# Patient Record
Sex: Female | Born: 1955 | Race: Black or African American | Hispanic: No | Marital: Single | Smoking: Never smoker
Health system: Southern US, Community
[De-identification: ages and names within clinical notes are randomized; demographics above are authoritative.]

## PROBLEM LIST (undated history)

## (undated) DIAGNOSIS — R1310 Dysphagia, unspecified: Secondary | ICD-10-CM

## (undated) DIAGNOSIS — M1990 Unspecified osteoarthritis, unspecified site: Secondary | ICD-10-CM

## (undated) DIAGNOSIS — E0590 Thyrotoxicosis, unspecified without thyrotoxic crisis or storm: Secondary | ICD-10-CM

## (undated) DIAGNOSIS — E785 Hyperlipidemia, unspecified: Secondary | ICD-10-CM

## (undated) DIAGNOSIS — R569 Unspecified convulsions: Secondary | ICD-10-CM

## (undated) DIAGNOSIS — I639 Cerebral infarction, unspecified: Secondary | ICD-10-CM

## (undated) DIAGNOSIS — I10 Essential (primary) hypertension: Secondary | ICD-10-CM

## (undated) DIAGNOSIS — M329 Systemic lupus erythematosus, unspecified: Secondary | ICD-10-CM

## (undated) HISTORY — DX: Unspecified osteoarthritis, unspecified site: M19.90

## (undated) HISTORY — DX: Hyperlipidemia, unspecified: E78.5

## (undated) HISTORY — DX: Thyrotoxicosis, unspecified without thyrotoxic crisis or storm: E05.90

## (undated) HISTORY — DX: Essential (primary) hypertension: I10

---

## 2004-11-15 ENCOUNTER — Ambulatory Visit: Payer: Self-pay | Admitting: Family Medicine

## 2005-12-18 ENCOUNTER — Ambulatory Visit: Payer: Self-pay | Admitting: Family Medicine

## 2009-07-20 ENCOUNTER — Encounter: Payer: Self-pay | Admitting: Family Medicine

## 2016-01-02 ENCOUNTER — Encounter (INDEPENDENT_AMBULATORY_CARE_PROVIDER_SITE_OTHER): Payer: Self-pay

## 2016-01-02 ENCOUNTER — Encounter: Payer: Self-pay | Admitting: Family Medicine

## 2016-01-02 ENCOUNTER — Ambulatory Visit (INDEPENDENT_AMBULATORY_CARE_PROVIDER_SITE_OTHER): Payer: PRIVATE HEALTH INSURANCE

## 2016-01-02 ENCOUNTER — Ambulatory Visit: Payer: PRIVATE HEALTH INSURANCE

## 2016-01-02 ENCOUNTER — Ambulatory Visit (INDEPENDENT_AMBULATORY_CARE_PROVIDER_SITE_OTHER): Payer: PRIVATE HEALTH INSURANCE | Admitting: Family Medicine

## 2016-01-02 VITALS — BP 150/76 | HR 106 | Temp 97.8°F | Ht 66.5 in | Wt 228.0 lb

## 2016-01-02 DIAGNOSIS — R601 Generalized edema: Secondary | ICD-10-CM

## 2016-01-02 DIAGNOSIS — M2550 Pain in unspecified joint: Secondary | ICD-10-CM

## 2016-01-02 DIAGNOSIS — M79642 Pain in left hand: Principal | ICD-10-CM

## 2016-01-02 DIAGNOSIS — M79641 Pain in right hand: Secondary | ICD-10-CM | POA: Diagnosis not present

## 2016-01-02 DIAGNOSIS — N289 Disorder of kidney and ureter, unspecified: Secondary | ICD-10-CM | POA: Diagnosis not present

## 2016-01-02 MED ORDER — DICLOFENAC SODIUM 75 MG PO TBEC: 75.0000 mg | DELAYED_RELEASE_TABLET | Freq: Two times a day (BID) | ORAL | Status: DC

## 2016-01-02 NOTE — Addendum Note (Signed)
Addended by: [name] on: 01/02/2016 04:03 PM   Modules accepted: Orders

## 2016-01-02 NOTE — Progress Notes (Signed)
Subjective:  Patient ID: Rebekah [name], female    DOB: 08-02-1956  Age: 60 y.o. MRN: [id number]  CC: Establish Care and Leg Swelling   HPI Rebekah [name] presents for 2 weeks of increased swelling in feet with pain at feet and knees when first standing. Feels slow and stiff. Limbers up in a few minutes. Went to [facility] for knees 2 weeks ago. Had XR showing arthritis. Injection into joints has not helped significantly. Now Considering next step. She went to [facility] two days ago as as swelling in legs worsened.   History Rebekah [name] has no past medical history on file.   She has past surgical history that includes Cesarean section.   Her family history includes Cancer in her father and mother; Diabetes in her mother; Hypertension in her brother, brother, mother, sister, sister, sister, sister, sister, and sister.She has no tobacco, alcohol, and drug history on file.  No current outpatient prescriptions on file prior to visit.   No current facility-administered medications on file prior to visit.    ROS Review of Systems  Constitutional: Negative for fever, activity change and appetite change.  HENT: Negative for congestion, rhinorrhea and sore throat.   Eyes: Negative for visual disturbance.  Respiratory: Negative for cough and shortness of breath.   Cardiovascular: Negative for chest pain and palpitations.  Gastrointestinal: Negative for nausea, abdominal pain and diarrhea.  Genitourinary: Negative for dysuria.  Musculoskeletal: Positive for myalgias, joint swelling and arthralgias.    Objective:  BP 150/76 mmHg  Pulse 106  Temp(Src) 97.8 F (36.6 C) (Oral)  Ht 5' 6.5" (1.689 m)  Wt 228 lb (103.42 kg)  BMI 36.25 kg/m2  SpO2 100%  Physical Exam  Constitutional: She is oriented to person, place, and time. She appears well-developed and well-nourished. No distress.  HENT:  Head: Normocephalic and atraumatic.  Right Ear: External ear normal.  Left Ear:  External ear normal.  Nose: Nose normal.  Mouth/Throat: Oropharynx is clear and moist.  Eyes: Conjunctivae and EOM are normal. Pupils are equal, round, and reactive to light.  Neck: Normal range of motion. Neck supple. No thyromegaly present.  Cardiovascular: Normal rate, regular rhythm and normal heart sounds.   No murmur heard. Pulmonary/Chest: Effort normal and breath sounds normal. No respiratory distress. She has no wheezes. She has no rales.  Abdominal: Soft. Bowel sounds are normal. She exhibits no distension. There is no tenderness.  Lymphadenopathy:    She has no cervical adenopathy.  Neurological: She is alert and oriented to person, place, and time. She has normal reflexes.  Skin: Skin is warm and dry.  Psychiatric: She has a normal mood and affect. Her behavior is normal. Judgment and thought content normal.    Assessment & Plan:   Rebekah [name] was seen today for establish care and leg swelling.  Diagnoses and all orders for this visit:  Arthralgia -     Arthritis Panel -     Anti-Scleroderma Antibody -     ANA -     Anti-DNA antibody, double-stranded  Pain in both hands -     DG Hand Complete Left; Future -     DG Hand Complete Right; Future  Renal insufficiency -     CMP14+EGFR  Generalized edema -     CMP14+EGFR   I am having [name] maintain her potassium chloride SA and furosemide.  Meds ordered this encounter  Medications  . potassium chloride SA (K-DUR,KLOR-CON) 20 MEQ tablet    Sig:  Take 20 mEq by mouth 2 (two) times daily.  . furosemide (LASIX) 20 MG tablet    Sig: Take 20 mg by mouth.     Follow-up: Return in about 2 weeks (around 01/16/2016).  [name], M.D.

## 2016-01-02 NOTE — Patient Instructions (Signed)
Earwax removal: ° °Debrox drops are available without a prescription at your pharmacy. ° °Lay on your side with the ear up that you want to treat. Place for 5 drops of the Debrox in the ear canal and lay still for 15 minutes. After that time you considered up and allow the excess to run out of the year and catch it with a Kleenex. Repeat this with the other ear if needed. ° °Repeat this process daily for 1 week. By that time the ear should feel less clogged and her hearing should be better, if not, follow up in the office for recheck of the ear. ° °Thanks, °Keaton Beichner °

## 2016-01-03 ENCOUNTER — Telehealth: Payer: Self-pay | Admitting: Family Medicine

## 2016-01-03 ENCOUNTER — Other Ambulatory Visit: Payer: Self-pay | Admitting: Family Medicine

## 2016-01-03 DIAGNOSIS — M129 Arthropathy, unspecified: Secondary | ICD-10-CM

## 2016-01-03 LAB — ARTHRITIS PANEL
Basophils Absolute: 0 10*3/uL (ref 0.0–0.2)
Basos: 0 %
EOS (ABSOLUTE): 0.1 10*3/uL (ref 0.0–0.4)
Eos: 1 %
HEMATOCRIT: 33 % — AB (ref 34.0–46.6)
HEMOGLOBIN: 10.4 g/dL — AB (ref 11.1–15.9)
IMMATURE GRANS (ABS): 0 10*3/uL (ref 0.0–0.1)
Immature Granulocytes: 1 %
LYMPHS: 14 %
Lymphocytes Absolute: 1.2 10*3/uL (ref 0.7–3.1)
MCH: 27.7 pg (ref 26.6–33.0)
MCHC: 31.5 g/dL (ref 31.5–35.7)
MCV: 88 fL (ref 79–97)
Monocytes Absolute: 0.5 10*3/uL (ref 0.1–0.9)
Monocytes: 6 %
NEUTROS ABS: 6.4 10*3/uL (ref 1.4–7.0)
Neutrophils: 78 %
Platelets: 668 10*3/uL — ABNORMAL HIGH (ref 150–379)
RBC: 3.75 x10E6/uL — ABNORMAL LOW (ref 3.77–5.28)
RDW: 14.9 % (ref 12.3–15.4)
RHEUMATOID FACTOR: 18.2 [IU]/mL — AB (ref 0.0–13.9)
Sed Rate: 89 mm/hr — ABNORMAL HIGH (ref 0–40)
URIC ACID: 6.4 mg/dL (ref 2.5–7.1)
WBC: 8.2 10*3/uL (ref 3.4–10.8)

## 2016-01-03 LAB — CMP14+EGFR
A/G RATIO: 0.7 — AB (ref 1.2–2.2)
ALBUMIN: 3.1 g/dL — AB (ref 3.6–4.8)
ALT: 65 IU/L — ABNORMAL HIGH (ref 0–32)
AST: 63 IU/L — ABNORMAL HIGH (ref 0–40)
Alkaline Phosphatase: 90 IU/L (ref 39–117)
BILIRUBIN TOTAL: 0.4 mg/dL (ref 0.0–1.2)
BUN / CREAT RATIO: 22 (ref 12–28)
BUN: 28 mg/dL — ABNORMAL HIGH (ref 8–27)
CHLORIDE: 99 mmol/L (ref 96–106)
CO2: 21 mmol/L (ref 18–29)
Calcium: 9 mg/dL (ref 8.7–10.3)
Creatinine, Ser: 1.26 mg/dL — ABNORMAL HIGH (ref 0.57–1.00)
GFR calc non Af Amer: 46 mL/min/{1.73_m2} — ABNORMAL LOW (ref >59)
GFR, EST AFRICAN AMERICAN: 54 mL/min/{1.73_m2} — AB (ref >59)
GLOBULIN, TOTAL: 4.6 g/dL — AB (ref 1.5–4.5)
Glucose: 94 mg/dL (ref 65–99)
POTASSIUM: 5 mmol/L (ref 3.5–5.2)
Sodium: 137 mmol/L (ref 134–144)
TOTAL PROTEIN: 7.7 g/dL (ref 6.0–8.5)

## 2016-01-03 LAB — ANTI-SCLERODERMA ANTIBODY: Scleroderma SCL-70: <0.2 AI (ref 0.0–0.9)

## 2016-01-03 LAB — ANA: Anti Nuclear Antibody(ANA): POSITIVE — AB

## 2016-01-03 LAB — ANTI-DNA ANTIBODY, DOUBLE-STRANDED: DSDNA AB: 104 [IU]/mL — AB (ref 0–9)

## 2016-01-03 NOTE — Telephone Encounter (Signed)
Patient called stating that she is fatigue.  Informed patient to drink plenty of fluids and to get some rest and if it does not resolve to call back

## 2016-01-05 ENCOUNTER — Telehealth: Payer: Self-pay | Admitting: Family Medicine

## 2016-01-06 NOTE — Telephone Encounter (Signed)
Patient aware that I faxed for her to [facility] [phone] per patient.

## 2016-01-10 ENCOUNTER — Other Ambulatory Visit: Payer: Self-pay | Admitting: *Deleted

## 2016-01-10 ENCOUNTER — Telehealth: Payer: Self-pay | Admitting: Family Medicine

## 2016-01-10 MED ORDER — POTASSIUM CHLORIDE CRYS ER 20 MEQ PO TBCR: 20.0000 meq | EXTENDED_RELEASE_TABLET | Freq: Every day | ORAL | Status: DC

## 2016-01-10 MED ORDER — FUROSEMIDE 20 MG PO TABS: 20.0000 mg | ORAL_TABLET | Freq: Every day | ORAL | Status: DC

## 2016-01-10 NOTE — Telephone Encounter (Signed)
Patient aware to continue to take the lasix and potassium.

## 2016-01-16 ENCOUNTER — Ambulatory Visit (INDEPENDENT_AMBULATORY_CARE_PROVIDER_SITE_OTHER): Payer: PRIVATE HEALTH INSURANCE | Admitting: Family Medicine

## 2016-01-16 ENCOUNTER — Encounter: Payer: Self-pay | Admitting: Family Medicine

## 2016-01-16 VITALS — BP 150/82 | HR 105 | Temp 97.8°F | Ht 66.5 in | Wt 217.0 lb

## 2016-01-16 DIAGNOSIS — M0579 Rheumatoid arthritis with rheumatoid factor of multiple sites without organ or systems involvement: Secondary | ICD-10-CM

## 2016-01-16 MED ORDER — TRAMADOL HCL 50 MG PO TABS: 50.0000 mg | ORAL_TABLET | Freq: Four times a day (QID) | ORAL | Status: DC | PRN

## 2016-01-16 MED ORDER — SUVOREXANT 15 MG PO TABS: 15.0000 mg | ORAL_TABLET | Freq: Every day | ORAL | Status: DC

## 2016-01-16 NOTE — Progress Notes (Signed)
Subjective:  Patient ID: Rebekah [name], female    DOB: [birth year]  Age: 60 y.o. MRN: [id number]  CC: Foot Swelling; Fatigue; and Joint Pain   HPI Rebekah [name] presents forcontinued swelling in feet with pain at feet and knees when first standing. Feels slow and stiff. Limbers up after getting up and moving around, but pain is preventing sleep and she is exhausted. Swelling is mild, but shoes are tight. Fingers won't bend right due to edema.. Even bought a new mattress and gets no relief.Rheumatology referral set up for May 17.  History Rebekah [name] has no past medical history on file.   She has past surgical history that includes Cesarean section.   Her family history includes Cancer in her father and mother; Diabetes in her mother; Hypertension in her brother, brother, mother, sister, sister, sister, sister, sister, and sister.She reports that she has never smoked. She does not have any smokeless tobacco history on file. Her alcohol and drug histories are not on file.  No current outpatient prescriptions on file prior to visit.   No current facility-administered medications on file prior to visit.    ROS Review of Systems  Constitutional: Negative for fever, activity change and appetite change.  HENT: Negative for congestion, rhinorrhea and sore throat.   Eyes: Negative for visual disturbance.  Respiratory: Negative for cough and shortness of breath.   Cardiovascular: Negative for chest pain and palpitations.  Gastrointestinal: Negative for nausea, abdominal pain and diarrhea.  Genitourinary: Negative for dysuria.  Musculoskeletal: Positive for myalgias, joint swelling and arthralgias.  Psychiatric/Behavioral: Positive for sleep disturbance, decreased concentration and agitation.    Objective:  BP 150/82 mmHg  Pulse 105  Temp(Src) 97.8 F (36.6 C) (Oral)  Ht 5' 6.5" (1.689 m)  Wt 217 lb (98.431 kg)  BMI 34.50 kg/m2  SpO2 100%  Physical Exam  Constitutional: She is  oriented to person, place, and time. She appears well-developed and well-nourished. No distress.  HENT:  Head: Normocephalic and atraumatic.  Right Ear: External ear normal.  Left Ear: External ear normal.  Nose: Nose normal.  Mouth/Throat: Oropharynx is clear and moist.  Eyes: Conjunctivae and EOM are normal. Pupils are equal, round, and reactive to light.  Neck: Normal range of motion. Neck supple. No thyromegaly present.  Cardiovascular: Normal rate, regular rhythm and normal heart sounds.   No murmur heard. Pulmonary/Chest: Effort normal and breath sounds normal. No respiratory distress. She has no wheezes. She has no rales.  Abdominal: Soft. Bowel sounds are normal. She exhibits no distension. There is no tenderness.  Musculoskeletal: She exhibits edema (1-2+ at feet and hands.).  Lymphadenopathy:    She has no cervical adenopathy.  Neurological: She is alert and oriented to person, place, and time. She has normal reflexes.  Skin: Skin is warm and dry.  Psychiatric: She has a normal mood and affect. Her behavior is normal. Judgment and thought content normal.    Assessment & Plan:   Rebekah [name] was seen today for foot swelling, fatigue and joint pain.  Diagnoses and all orders for this visit:  Rheumatoid arthritis involving multiple sites with positive rheumatoid factor ([place])  Other orders -     traMADol (ULTRAM) 50 MG tablet; Take 1 tablet (50 mg total) by mouth 4 (four) times daily as needed for moderate pain. -     Suvorexant (BELSOMRA) 15 MG TABS; Take 15 mg by mouth at bedtime. For sleep   I have discontinued Ms. Tardiff's diclofenac, furosemide, and potassium chloride  SA. I am also having her start on traMADol and Suvorexant.  Meds ordered this encounter  Medications  . traMADol (ULTRAM) 50 MG tablet    Sig: Take 1 tablet (50 mg total) by mouth 4 (four) times daily as needed for moderate pain.    Dispense:  60 tablet    Refill:  02  . Suvorexant (BELSOMRA) 15 MG TABS      Sig: Take 15 mg by mouth at bedtime. For sleep    Dispense:  30 tablet    Refill:  2     Follow-up: Return in about 6 weeks (around 02/27/2016) for hypertension, pain, sleep.  [name], M.D.

## 2016-02-06 ENCOUNTER — Telehealth: Payer: Self-pay

## 2016-02-06 NOTE — Telephone Encounter (Signed)
Insurance denied prior authorization for [employer]

## 2016-02-06 NOTE — Telephone Encounter (Signed)
Please contact the patient to see if she is willing to try something else.

## 2016-02-07 MED ORDER — ZALEPLON 5 MG PO CAPS: 5.0000 mg | ORAL_CAPSULE | Freq: Every evening | ORAL | Status: DC | PRN

## 2016-02-07 NOTE — Telephone Encounter (Signed)
Please call in zaleplon as written and let the pt know.

## 2016-02-07 NOTE — Addendum Note (Signed)
Addended by: [name] on: 02/07/2016 12:15 PM   Modules accepted: Orders, Medications

## 2016-02-07 NOTE — Telephone Encounter (Signed)
Pt would like to try something else.

## 2016-02-07 NOTE — Telephone Encounter (Signed)
Pt notified of new RX 

## 2016-04-04 ENCOUNTER — Telehealth: Payer: Self-pay | Admitting: Family Medicine

## 2016-05-06 ENCOUNTER — Telehealth: Payer: Self-pay | Admitting: Family Medicine

## 2016-05-14 ENCOUNTER — Ambulatory Visit (INDEPENDENT_AMBULATORY_CARE_PROVIDER_SITE_OTHER): Payer: PRIVATE HEALTH INSURANCE | Admitting: Family Medicine

## 2016-05-14 ENCOUNTER — Encounter: Payer: Self-pay | Admitting: Family Medicine

## 2016-05-14 VITALS — BP 177/87 | HR 98 | Temp 98.4°F | Ht 66.5 in | Wt 223.6 lb

## 2016-05-14 DIAGNOSIS — H6123 Impacted cerumen, bilateral: Secondary | ICD-10-CM

## 2016-05-14 DIAGNOSIS — J0100 Acute maxillary sinusitis, unspecified: Secondary | ICD-10-CM | POA: Diagnosis not present

## 2016-05-14 DIAGNOSIS — M0579 Rheumatoid arthritis with rheumatoid factor of multiple sites without organ or systems involvement: Secondary | ICD-10-CM

## 2016-05-14 MED ORDER — AMOXICILLIN-POT CLAVULANATE 875-125 MG PO TABS: 1.0000 | ORAL_TABLET | Freq: Two times a day (BID) | ORAL | 0 refills | Status: DC

## 2016-05-14 MED ORDER — BETAMETHASONE SOD PHOS & ACET 6 (3-3) MG/ML IJ SUSP
6.0000 mg | Freq: Once | INTRAMUSCULAR | Status: AC
  Administered 2016-05-14: 6 mg via INTRAMUSCULAR

## 2016-05-14 MED ORDER — PSEUDOEPHEDRINE-GUAIFENESIN ER 120-1200 MG PO TB12: 1.0000 | ORAL_TABLET | Freq: Two times a day (BID) | ORAL | 0 refills | Status: DC

## 2016-05-14 NOTE — Progress Notes (Signed)
Subjective:  Patient ID: Rebekah [name], female    DOB: 07/27/1956  Age: 60 y.o. MRN: [id number]  CC: Cough (feels deep, productive - greenish yellow now, over 2 wks now, OTC mucinex & cough med, no known fever) and Nasal Congestion   HPI Rebekah [name] presents for 3 weeks of increasing congestion. This developed into posterior drainage. From there she developed cough. This has become productive. Recently it's become yellow-green in color. She's had no fever. She denies shortness of breath. She has had some full feeling in her ears and decreased hearing. She's been cleaning with a Q-tip.  Patient has been officially diagnosed with rheumatoid arthritis and is taking Plaquenil and prednisone. The prednisone is being tapered. She is now at 1 tablet daily. Her arthralgias have been diminished significantly. She is just not taking any advanced immunosuppression drugs.   History Rebekah [name] has a past medical history of Arthritis.   She has a past surgical history that includes Cesarean section.   Her family history includes Cancer in her father and mother; Diabetes in her mother; Hypertension in her brother, brother, mother, sister, sister, sister, sister, sister, and sister.She reports that she has never smoked. She has never used smokeless tobacco. She reports that she does not drink alcohol or use drugs.    ROS Review of Systems  Constitutional: Negative for activity change, appetite change, chills and fever.  HENT: Positive for congestion, postnasal drip, rhinorrhea and sinus pressure. Negative for ear discharge, ear pain, hearing loss, nosebleeds, sneezing and trouble swallowing.   Respiratory: Negative for chest tightness and shortness of breath.   Cardiovascular: Negative for chest pain and palpitations.  Skin: Negative for rash.    Objective:  BP (!) 177/87 (BP Location: Right Arm, Patient Position: Sitting, Cuff Size: Large)   Pulse 98   Temp 98.4 F (36.9 C) (Oral)   Ht  5' 6.5" (1.689 m)   Wt 223 lb 9.6 oz (101.4 kg)   BMI 35.55 kg/m   BP Readings from Last 3 Encounters:  05/14/16 (!) 177/87  01/16/16 (!) 150/82  01/02/16 (!) 150/76    Wt Readings from Last 3 Encounters:  05/14/16 223 lb 9.6 oz (101.4 kg)  01/16/16 217 lb (98.4 kg)  01/02/16 228 lb (103.4 kg)     Physical Exam  Constitutional: She is oriented to person, place, and time. She appears well-developed and well-nourished.  HENT:  Head: Normocephalic and atraumatic.  Right Ear: External ear normal. No mastoid tenderness. No decreased hearing is noted.  Left Ear: External ear normal. No mastoid tenderness. No decreased hearing is noted.  Nose: Mucosal edema present. Right sinus exhibits maxillary sinus tenderness. Right sinus exhibits no frontal sinus tenderness. Left sinus exhibits maxillary sinus tenderness. Left sinus exhibits no frontal sinus tenderness.  Mouth/Throat: No oropharyngeal exudate or posterior oropharyngeal erythema.  Bilateral ear canals occluded with cerumen. Patient is talking a little bit more loudly then I would anticipate normal for her. I suspect this is from mild hearing loss due to cerumen occlusion  Neck: No Brudzinski's sign noted.  Pulmonary/Chest: No respiratory distress. She has wheezes (fair to good exchange with scattered wheezing).  Lymphadenopathy:       Head (right side): No preauricular adenopathy present.       Head (left side): No preauricular adenopathy present.       Right cervical: No superficial cervical adenopathy present.      Left cervical: No superficial cervical adenopathy present.  Neurological: She is alert and  oriented to person, place, and time.  Skin: Skin is warm and dry. No erythema.     Lab Results  Component Value Date   WBC 8.2 01/02/2016   HCT 33.0 (L) 01/02/2016   PLT 668 (H) 01/02/2016   GLUCOSE 94 01/02/2016   ALT 65 (H) 01/02/2016   AST 63 (H) 01/02/2016   NA 137 01/02/2016   K 5.0 01/02/2016   CL 99 01/02/2016     CREATININE 1.26 (H) 01/02/2016   BUN 28 (H) 01/02/2016   CO2 21 01/02/2016    No results found.  Assessment & Plan:   [name] was seen today for cough and nasal congestion.  Diagnoses and all orders for this visit:  Acute maxillary sinusitis, recurrence not specified  Cerumen impaction, bilateral  Rheumatoid arthritis involving multiple sites with positive rheumatoid factor ([place])      I am having [name] maintain her traMADol, zaleplon, hydroxychloroquine, and predniSONE.  Meds ordered this encounter  Medications  . hydroxychloroquine (PLAQUENIL) 200 MG tablet    Sig: Take 1 tablet by mouth 2 (two) times daily.  . predniSONE (DELTASONE) 2.5 MG tablet    Sig: Take 2.5 mg by mouth daily with breakfast.     Follow-up: No Follow-up on file.  [name], M.D.

## 2017-01-20 ENCOUNTER — Other Ambulatory Visit: Payer: Self-pay | Admitting: Family Medicine

## 2017-01-21 NOTE — Telephone Encounter (Signed)
Left detailed message per dpr  

## 2017-01-21 NOTE — Telephone Encounter (Signed)
Authorize 30 days only. Then contact the patient letting them know that they will need an appointment before any further prescriptions can be sent in. 

## 2017-06-13 ENCOUNTER — Other Ambulatory Visit (HOSPITAL_COMMUNITY): Payer: Self-pay | Admitting: Nephrology

## 2017-06-13 DIAGNOSIS — R809 Proteinuria, unspecified: Secondary | ICD-10-CM

## 2017-06-19 ENCOUNTER — Other Ambulatory Visit: Payer: Self-pay | Admitting: Radiology

## 2017-06-23 ENCOUNTER — Ambulatory Visit (HOSPITAL_COMMUNITY)
Admission: RE | Admit: 2017-06-23 | Discharge: 2017-06-23 | Disposition: A | Discharge status: Home / Self Care | Payer: PRIVATE HEALTH INSURANCE | Source: Ambulatory Visit | Attending: Nephrology | Admitting: Nephrology

## 2017-06-23 ENCOUNTER — Encounter (HOSPITAL_COMMUNITY): Payer: Self-pay

## 2018-02-03 ENCOUNTER — Telehealth: Payer: Self-pay | Admitting: Family Medicine

## 2018-04-30 ENCOUNTER — Institutional Professional Consult (permissible substitution): Payer: PRIVATE HEALTH INSURANCE | Admitting: Neurology

## 2018-06-09 ENCOUNTER — Encounter

## 2018-06-09 ENCOUNTER — Institutional Professional Consult (permissible substitution): Payer: PRIVATE HEALTH INSURANCE | Admitting: Neurology

## 2018-09-30 ENCOUNTER — Encounter: Payer: Self-pay | Admitting: Family Medicine

## 2018-10-03 ENCOUNTER — Other Ambulatory Visit: Payer: Self-pay

## 2018-10-03 ENCOUNTER — Inpatient Hospital Stay (HOSPITAL_COMMUNITY)
Admission: AD | Admit: 2018-10-03 | Discharge: 2018-10-05 | DRG: 194 | Disposition: A | Discharge status: Home / Self Care | Payer: PRIVATE HEALTH INSURANCE | Source: Other Acute Inpatient Hospital | Attending: Internal Medicine | Admitting: Internal Medicine

## 2018-10-03 DIAGNOSIS — Z8249 Family history of ischemic heart disease and other diseases of the circulatory system: Secondary | ICD-10-CM

## 2018-10-03 DIAGNOSIS — I3139 Other pericardial effusion (noninflammatory): Secondary | ICD-10-CM | POA: Diagnosis present

## 2018-10-03 DIAGNOSIS — F419 Anxiety disorder, unspecified: Secondary | ICD-10-CM | POA: Diagnosis present

## 2018-10-03 DIAGNOSIS — I313 Pericardial effusion (noninflammatory): Secondary | ICD-10-CM

## 2018-10-03 DIAGNOSIS — J189 Pneumonia, unspecified organism: Secondary | ICD-10-CM | POA: Diagnosis present

## 2018-10-03 DIAGNOSIS — R0602 Shortness of breath: Secondary | ICD-10-CM

## 2018-10-03 DIAGNOSIS — I129 Hypertensive chronic kidney disease with stage 1 through stage 4 chronic kidney disease, or unspecified chronic kidney disease: Secondary | ICD-10-CM | POA: Diagnosis present

## 2018-10-03 DIAGNOSIS — Z79899 Other long term (current) drug therapy: Secondary | ICD-10-CM

## 2018-10-03 DIAGNOSIS — I10 Essential (primary) hypertension: Secondary | ICD-10-CM

## 2018-10-03 DIAGNOSIS — J90 Pleural effusion, not elsewhere classified: Secondary | ICD-10-CM | POA: Diagnosis present

## 2018-10-03 DIAGNOSIS — N184 Chronic kidney disease, stage 4 (severe): Secondary | ICD-10-CM

## 2018-10-03 DIAGNOSIS — M0579 Rheumatoid arthritis with rheumatoid factor of multiple sites without organ or systems involvement: Secondary | ICD-10-CM | POA: Diagnosis present

## 2018-10-03 DIAGNOSIS — J181 Lobar pneumonia, unspecified organism: Secondary | ICD-10-CM

## 2018-10-03 LAB — CREATININE, SERUM
CREATININE: 1.5 mg/dL — AB (ref 0.44–1.00)
GFR calc Af Amer: 43 mL/min — ABNORMAL LOW (ref >60)
GFR calc non Af Amer: 37 mL/min — ABNORMAL LOW (ref >60)

## 2018-10-03 LAB — CBC
HCT: 27.6 % — ABNORMAL LOW (ref 36.0–46.0)
Hemoglobin: 8.5 g/dL — ABNORMAL LOW (ref 12.0–15.0)
MCH: 26 pg (ref 26.0–34.0)
MCHC: 30.8 g/dL (ref 30.0–36.0)
MCV: 84.4 fL (ref 80.0–100.0)
PLATELETS: 558 10*3/uL — AB (ref 150–400)
RBC: 3.27 MIL/uL — ABNORMAL LOW (ref 3.87–5.11)
RDW: 15 % (ref 11.5–15.5)
WBC: 11.9 10*3/uL — ABNORMAL HIGH (ref 4.0–10.5)
nRBC: 0 % (ref 0.0–0.2)

## 2018-10-03 MED ORDER — LISINOPRIL 20 MG PO TABS
40.0000 mg | ORAL_TABLET | Freq: Every day | ORAL | Status: DC
  Administered 2018-10-04 – 2018-10-05 (×2): 40 mg via ORAL
  Filled 2018-10-03 (×2): qty 2

## 2018-10-03 MED ORDER — HEPARIN SODIUM (PORCINE) 5000 UNIT/ML IJ SOLN
5000.0000 [IU] | Freq: Three times a day (TID) | INTRAMUSCULAR | Status: DC
  Administered 2018-10-03 – 2018-10-05 (×6): 5000 [IU] via SUBCUTANEOUS
  Filled 2018-10-03 (×6): qty 1

## 2018-10-03 MED ORDER — SODIUM CHLORIDE 0.9 % IV SOLN
500.0000 mg | INTRAVENOUS | Status: DC
  Administered 2018-10-03 – 2018-10-04 (×2): 500 mg via INTRAVENOUS
  Filled 2018-10-03 (×3): qty 500

## 2018-10-03 MED ORDER — SODIUM CHLORIDE 0.9 % IV SOLN
INTRAVENOUS | Status: DC
  Administered 2018-10-03: 23:00:00 via INTRAVENOUS

## 2018-10-03 MED ORDER — SODIUM CHLORIDE 0.9 % IV SOLN
1.0000 g | INTRAVENOUS | Status: DC
  Administered 2018-10-04 (×2): 1 g via INTRAVENOUS
  Filled 2018-10-03 (×2): qty 10

## 2018-10-03 MED ORDER — PREDNISONE 2.5 MG PO TABS
2.5000 mg | ORAL_TABLET | Freq: Every day | ORAL | Status: DC
  Administered 2018-10-04 – 2018-10-05 (×2): 2.5 mg via ORAL
  Filled 2018-10-03 (×3): qty 1

## 2018-10-03 MED ORDER — HYDROXYCHLOROQUINE SULFATE 200 MG PO TABS
200.0000 mg | ORAL_TABLET | Freq: Two times a day (BID) | ORAL | Status: DC
  Administered 2018-10-03 – 2018-10-05 (×4): 200 mg via ORAL
  Filled 2018-10-03 (×5): qty 1

## 2018-10-03 NOTE — H&P (Signed)
History and Physical   Rebekah [name] [id number] DOB: October 06, 1955 DOA: 10/03/2018  Referring MD/NP/PA: Benson Norway  PCP: [name], MD   Outpatient Specialists: Benson Norway  Patient coming from: [facility]  Chief Complaint: Shortness of breath  HPI: Rebekah [name] is a 63 y.o. female with medical history significant of rheumatoid arthritis, hypertension, chronic kidney disease stage III with baseline creatinine 1.5, no known cardiac history who apparently started having exertional dyspnea on and off for the last 3 weeks.  It got worse yesterday.  Patient was seen by her PCP when she first started having dyspnea and was treated for community-acquired pneumonia.  She was initiated with ceftriaxone and azithromycin in the ER today.  CT chest showed right lower lobe pneumonia and antibiotics were switched to Zosyn.  Patient has had new oxygen requirements.  She was placed on 8 L with oxygen sat of 94% over there.  She was also found to have moderate new pericardial effusion on CT.  She has no hemodynamic instability and no tamponade but patient requires cardiology evaluation.  They were unable to get echocardiogram or cardiologist available over the weekend.  She was sent over for further treatment..  ED Course: Vitals at [facility] rocking him with relatively stable but patient is requiring 8 L of oxygen.  She has a white count of 12.8 and the CT finding a right lower lobe pneumonia.  Patient also has hypertension but blood pressure was controlled.  Based on her clinical needs she was started on Zosyn and sent over here for further treatment  Review of Systems: As per HPI otherwise 10 point review of systems negative.    Past Medical History:  Diagnosis Date  . Arthritis    Rheumatiod    Past Surgical History:  Procedure Laterality Date  . CESAREAN SECTION       reports that she has never smoked. She has never used smokeless tobacco. She reports that she does not drink  alcohol or use drugs.  No Known Allergies  Family History  Problem Relation Age of Onset  . Diabetes Mother   . Hypertension Mother   . Cancer Mother   . Cancer Father   . Hypertension Sister   . Hypertension Brother   . Hypertension Sister   . Hypertension Sister   . Hypertension Sister   . Hypertension Brother   . Hypertension Sister   . Hypertension Sister      Prior to Admission medications   Medication Sig Start Date End Date Taking? Authorizing Provider  hydroxychloroquine (PLAQUENIL) 200 MG tablet Take 1 tablet by mouth 2 (two) times daily. 02/09/16   [provider]  lisinopril (PRINIVIL,ZESTRIL) 40 MG tablet Take 40 mg by mouth daily.    [provider]  predniSONE (DELTASONE) 2.5 MG tablet Take 2.5 mg by mouth daily as needed.     [provider]    Physical Exam: Vitals:   10/03/18 2029  BP: 133/78  Pulse: 92  Resp: (!) 26  Temp: 98.6 F (37 C)  TempSrc: Oral  SpO2: 96%  Weight: 99 kg  Height: 5\' 6"  (1.676 m)      Constitutional: NAD, calm, comfortable Vitals:   10/03/18 2029  BP: 133/78  Pulse: 92  Resp: (!) 26  Temp: 98.6 F (37 C)  TempSrc: Oral  SpO2: 96%  Weight: 99 kg  Height: 5\' 6"  (1.676 m)   Eyes: PERRL, lids and conjunctivae normal ENMT: Mucous membranes are moist. Posterior pharynx clear of  any exudate or lesions.Normal dentition.  Neck: normal, supple, no masses, no thyromegaly Respiratory: clear to auscultatio good air entry  bilaterally, no wheezing, mild bilateral crackles. Normal respiratory effort. No accessory muscle use.  Cardiovascular: Distant heart sounds, regular rate and rhythm, no murmurs / rubs / gallops. No extremity edema. 2+ pedal pulses. No carotid bruits.  Abdomen: no tenderness, no masses palpated. No hepatosplenomegaly. Bowel sounds positive.  Musculoskeletal: no clubbing / cyanosis. No joint deformity upper and lower extremities. Good ROM, no contractures. Normal muscle tone.  Skin:  no rashes, lesions, ulcers. No induration Neurologic: CN 2-12 grossly intact. Sensation intact, DTR normal. Strength 5/5 in all 4.  Psychiatric: Normal judgment and insight. Alert and oriented x 3. Normal mood.     Labs on Admission: I have personally reviewed following labs and imaging studies  CBC: No results for input(s): WBC, NEUTROABS, HGB, HCT, MCV, PLT in the last 168 hours. Basic Metabolic Panel: No results for input(s): NA, K, CL, CO2, GLUCOSE, BUN, CREATININE, CALCIUM, MG, PHOS in the last 168 hours. GFR: CrCl cannot be calculated (Patient's most recent lab result is older than the maximum 21 days allowed.). Liver Function Tests: No results for input(s): AST, ALT, ALKPHOS, BILITOT, PROT, ALBUMIN in the last 168 hours. No results for input(s): LIPASE, AMYLASE in the last 168 hours. No results for input(s): AMMONIA in the last 168 hours. Coagulation Profile: No results for input(s): INR, PROTIME in the last 168 hours. Cardiac Enzymes: No results for input(s): CKTOTAL, CKMB, CKMBINDEX, TROPONINI in the last 168 hours. BNP (last 3 results) No results for input(s): PROBNP in the last 8760 hours. HbA1C: No results for input(s): HGBA1C in the last 72 hours. CBG: No results for input(s): GLUCAP in the last 168 hours. Lipid Profile: No results for input(s): CHOL, HDL, LDLCALC, TRIG, CHOLHDL, LDLDIRECT in the last 72 hours. Thyroid Function Tests: No results for input(s): TSH, T4TOTAL, FREET4, T3FREE, THYROIDAB in the last 72 hours. Anemia Panel: No results for input(s): VITAMINB12, FOLATE, FERRITIN, TIBC, IRON, RETICCTPCT in the last 72 hours. Urine analysis: No results found for: COLORURINE, APPEARANCEUR, LABSPEC, PHURINE, GLUCOSEU, HGBUR, BILIRUBINUR, KETONESUR, PROTEINUR, UROBILINOGEN, NITRITE, LEUKOCYTESUR Sepsis Labs: @LABRCNTIP (procalcitonin:4,lacticidven:4) )No results found for this or any previous visit (from the past 240 hour(s)).   Radiological Exams on  Admission: No results found.    Assessment/Plan Principal Problem:   Pneumonia Active Problems:   Rheumatoid arthritis involving multiple sites with positive rheumatoid factor (HCC)   Pericardial effusion   Benign essential HTN     #1 community-acquired pneumonia: Patient will be admitted and be started on the Rocephin and Zithromax.  I still believe she will benefit from that.  She only had initial dose in the ED over there.  Get blood cultures and sputum cultures and adjust antibiotics as necessary.  #2 moderate pericardial effusion: Most likely secondary to her rheumatoid arthritis.  No concern for tamponade at the moment.  We will get echocardiogram and cardiology consultation as needed.  #3 history of rheumatoid arthritis: Currently on Plaquenil and steroids.  Continue treatment.  #4 hypertension: Patient currently on lisinopril.  Continue lisinopril and monitor.  She has chronic kidney disease also.  Watch renal function on lisinopril.  #5 chronic kidney disease stage III: Monitor renal function.  Most likely related to autoimmune disease.   DVT prophylaxis: Heparin Code Status: Full code Family Communication: Sister and brother-in-law in the room Disposition Plan: Home Consults called: Cardiology to be called depending on echo results Admission status: Inpatient  Severity of Illness: The appropriate patient status for this patient is INPATIENT. Inpatient status is judged to be reasonable and necessary in order to provide the required intensity of service to ensure the patient's safety. The patient's presenting symptoms, physical exam findings, and initial radiographic and laboratory data in the context of their chronic comorbidities is felt to place them at high risk for further clinical deterioration. Furthermore, it is not anticipated that the patient will be medically stable for discharge from the hospital within 2 midnights of admission. The following factors support the  patient status of inpatient.   " The patient's presenting symptoms include Shortness of breath and cough. " The worrisome physical exam findings include bilateral crackles and distant heart sounds. " The initial radiographic and laboratory data are worrisome because of pneumonia with pericardial effusion. " The chronic co-morbidities include rheumatoid arthritis.   * I certify that at the point of admission it is my clinical judgment that the patient will require inpatient hospital care spanning beyond 2 midnights from the point of admission due to high intensity of service, high risk for further deterioration and high frequency of surveillance required.Barbette Merino MD [facility] Pager [phone]  If 7PM-7AM, please contact night-coverage [email or web address] Password [facility]  10/03/2018, 9:46 PM

## 2018-10-04 ENCOUNTER — Inpatient Hospital Stay (HOSPITAL_COMMUNITY): Payer: PRIVATE HEALTH INSURANCE

## 2018-10-04 ENCOUNTER — Encounter (HOSPITAL_COMMUNITY): Payer: Self-pay | Admitting: *Deleted

## 2018-10-04 DIAGNOSIS — R0602 Shortness of breath: Secondary | ICD-10-CM

## 2018-10-04 LAB — COMPREHENSIVE METABOLIC PANEL
ALK PHOS: 62 U/L (ref 38–126)
ALT: 54 U/L — AB (ref 0–44)
AST: 42 U/L — ABNORMAL HIGH (ref 15–41)
Albumin: 2.2 g/dL — ABNORMAL LOW (ref 3.5–5.0)
Anion gap: 11 (ref 5–15)
BUN: 16 mg/dL (ref 8–23)
CALCIUM: 8.5 mg/dL — AB (ref 8.9–10.3)
CO2: 22 mmol/L (ref 22–32)
Chloride: 105 mmol/L (ref 98–111)
Creatinine, Ser: 1.43 mg/dL — ABNORMAL HIGH (ref 0.44–1.00)
GFR calc non Af Amer: 39 mL/min — ABNORMAL LOW (ref >60)
GFR, EST AFRICAN AMERICAN: 45 mL/min — AB (ref >60)
Glucose, Bld: 151 mg/dL — ABNORMAL HIGH (ref 70–99)
Potassium: 4.8 mmol/L (ref 3.5–5.1)
Sodium: 138 mmol/L (ref 135–145)
Total Bilirubin: 0.7 mg/dL (ref 0.3–1.2)
Total Protein: 6.6 g/dL (ref 6.5–8.1)

## 2018-10-04 LAB — HIV ANTIBODY (ROUTINE TESTING W REFLEX): HIV Screen 4th Generation wRfx: NONREACTIVE

## 2018-10-04 LAB — MRSA PCR SCREENING: MRSA BY PCR: NEGATIVE

## 2018-10-04 NOTE — Plan of Care (Signed)

## 2018-10-04 NOTE — Progress Notes (Signed)
  Echocardiogram 2D Echocardiogram has been performed.  Rebekah [name] 10/04/2018, 6:07 PM

## 2018-10-04 NOTE — Progress Notes (Signed)
Pt arrived to unit. VSS. MD aware of patients arrival. Awaiting orders. Will continue to monitor.

## 2018-10-04 NOTE — Progress Notes (Signed)
PROGRESS NOTE    Rebekah [name]  ZHG:[id number] DOB: August 20, 1956 DOA: 10/03/2018 PCP: [name], MD   Brief Narrative: Patient is a 63 year old female with past medical history of rheumatoid arthritis, hypertension, chronic kidney disease stage III with baseline creatinine of 1.5 who was having exertional dyspnea on and off for last 3 weeks.    CT chest done at [facility]  had shown right lower lobe pneumonia and she was started on ceftriaxone azithromycin.  CT chest also showed pericardial effusion and she was sent here for echardiogram and cardiology evaluation. We have continued treatment for an pneumonia here.  Echocardiogram has been ordered.  Assessment & Plan:   Principal Problem:   Pneumonia Active Problems:   Rheumatoid arthritis involving multiple sites with positive rheumatoid factor (HCC)   Pericardial effusion   Benign essential HTN   Chronic kidney disease (CKD), stage IV (severe) (HCC)  Pericardial effusion as per CT scan: Echocardiogram has been ordered.  Will consider cardiology evaluation depending upon the echo report. Patient denies chest pain.  She is hemodynamically stable.  Community-acquired pneumonia: CT chest done at the previous hospital showed right-sided pneumonia.  Chest x-ray done here today showed increased aeration of the right lung, bilateral small pleural effusions.  Currently on ceftriaxone and azithromycin and will continue that.  Follow-up blood cultures, sputum cultures.  Hypertension: Currently blood pressure stable.  Currently on lisinopril which we will continue  History of rheumatoid arthritis: On Plaquenil and steroids.  Continue those  CKD stage III: Currently stable and kidney function is at baseline.           DVT prophylaxis: Heparin [place] Code Status: Full Family Communication: None present at the bedside Disposition Plan: Home after full work-up   Consultants: None  Procedures: None  Antimicrobials:    Anti-infectives (From admission, onward)   Start     Dose/Rate Route Frequency Ordered Stop   10/03/18 2359  hydroxychloroquine (PLAQUENIL) tablet 200 mg     200 mg Oral 2 times daily 10/03/18 2144     10/03/18 2230  azithromycin (ZITHROMAX) 500 mg in sodium chloride 0.9 % 250 mL IVPB     500 mg 250 mL/hr over 60 Minutes Intravenous Every 24 hours 10/03/18 2144 10/10/18 2229   10/03/18 2145  cefTRIAXone (ROCEPHIN) 1 g in sodium chloride 0.9 % 100 mL IVPB     1 g 200 mL/hr over 30 Minutes Intravenous Every 24 hours 10/03/18 2144 10/10/18 2144      Subjective: Patient seen and examined at bedside this morning.  Remains comfortable.  Hemodynamically stable.  Denies any chest pain or shortness of breath at rest.  Objective: Vitals:   10/03/18 2356 10/04/18 0308 10/04/18 0823 10/04/18 1131  BP: 131/74 135/74 (!) 147/69 124/67  Pulse: 85 83 89 81  Resp: (!) 24 20 (!) 22 (!) 25  Temp: 98.4 F (36.9 C) 97.7 F (36.5 C) 98.2 F (36.8 C) 98.4 F (36.9 C)  TempSrc: Oral Oral Oral Oral  SpO2: 96% 95% 98% 94%  Weight:      Height:        Intake/Output Summary (Last 24 hours) at 10/04/2018 1143 Last data filed at 10/04/2018 0500 Gross per 24 hour  Intake 618.45 ml  Output -  Net 618.45 ml   Filed Weights   10/03/18 2029  Weight: 99 kg    Examination:  General exam: Appears calm and comfortable ,Not in distress,obese  HEENT:PERRL,Oral mucosa moist, Ear/Nose normal on gross exam Respiratory system: Bilateral  decreased air entry in the bases, decreased air entry on the left side, no wheezes or crackles Cardiovascular system: S1 & S2 heard, RRR. No JVD, murmurs, rubs, gallops or clicks. No pedal edema. Gastrointestinal system: Abdomen is nondistended, soft and nontender. No organomegaly or masses felt. Normal bowel sounds heard. Central nervous system: Alert and oriented. No focal neurological deficits. Extremities: No edema, no clubbing ,no cyanosis, distal peripheral pulses  palpable. Skin: No rashes, lesions or ulcers,no icterus ,no pallor MSK: Normal muscle bulk,tone ,power Psychiatry: Judgement and insight appear normal. Mood & affect appropriate.     Data Reviewed: I have personally reviewed following labs and imaging studies  CBC: Recent Labs  Lab 10/03/18 2205  WBC 11.9*  HGB 8.5*  HCT 27.6*  MCV 84.4  PLT 450*   Basic Metabolic Panel: Recent Labs  Lab 10/03/18 2205 10/04/18 0222  NA  --  138  K  --  4.8  CL  --  105  CO2  --  22  GLUCOSE  --  151*  BUN  --  16  CREATININE 1.50* 1.43*  CALCIUM  --  8.5*   GFR: Estimated Creatinine Clearance: 47.8 mL/min (A) (by C-G formula based on SCr of 1.43 mg/dL (H)). Liver Function Tests: Recent Labs  Lab 10/04/18 0222  AST 42*  ALT 54*  ALKPHOS 62  BILITOT 0.7  PROT 6.6  ALBUMIN 2.2*   No results for input(s): LIPASE, AMYLASE in the last 168 hours. No results for input(s): AMMONIA in the last 168 hours. Coagulation Profile: No results for input(s): INR, PROTIME in the last 168 hours. Cardiac Enzymes: No results for input(s): CKTOTAL, CKMB, CKMBINDEX, TROPONINI in the last 168 hours. BNP (last 3 results) No results for input(s): PROBNP in the last 8760 hours. HbA1C: No results for input(s): HGBA1C in the last 72 hours. CBG: No results for input(s): GLUCAP in the last 168 hours. Lipid Profile: No results for input(s): CHOL, HDL, LDLCALC, TRIG, CHOLHDL, LDLDIRECT in the last 72 hours. Thyroid Function Tests: No results for input(s): TSH, T4TOTAL, FREET4, T3FREE, THYROIDAB in the last 72 hours. Anemia Panel: No results for input(s): VITAMINB12, FOLATE, FERRITIN, TIBC, IRON, RETICCTPCT in the last 72 hours. Sepsis Labs: No results for input(s): PROCALCITON, LATICACIDVEN in the last 168 hours.  Recent Results (from the past 240 hour(s))  MRSA PCR Screening     Status: None   Collection Time: 10/04/18  1:43 AM  Result Value Ref Range Status   MRSA by PCR NEGATIVE NEGATIVE Final      Comment:        The GeneXpert MRSA Assay (FDA approved for NASAL specimens only), is one component of a comprehensive MRSA colonization surveillance program. It is not intended to diagnose MRSA infection nor to guide or monitor treatment for MRSA infections. Performed at [facility], [place] [address], [place], [place] [address]          Radiology Studies: Dg Chest 2 View  Result Date: 10/04/2018 CLINICAL DATA:  Shortness of breath. EXAM: CHEST - 2 VIEW COMPARISON:  Chest radiograph and CT from 10/03/2018 FINDINGS: Stable enlargement of the cardiac silhouette and related to the known pericardial effusion. Patchy densities in the lower chest are similar to the previous examination compatible with areas of volume loss and small loculated pleural effusions. Slightly improved aeration in the mid and lower right chest. Negative for a pneumothorax. IMPRESSION: 1. Slightly improved aeration in the right lower chest compared to the recent comparison examination. 2.  Stable enlargement of the cardiac silhouette related to the pericardial effusion. 3. Densities in the lower chest bilaterally related to small loculated pleural effusions and volume loss. Electronically Signed   By: Markus Daft M.D.   On: 10/04/2018 10:22        Scheduled Meds: . heparin  5,000 Units Subcutaneous Q8H  . hydroxychloroquine  200 mg Oral BID  . lisinopril  40 mg Oral Daily  . predniSONE  2.5 mg Oral Q breakfast   Continuous Infusions: . sodium chloride 75 mL/hr at 10/04/18 0500  . azithromycin Stopped (10/04/18 0028)  . cefTRIAXone (ROCEPHIN)  IV Stopped (10/04/18 0328)     LOS: 1 day    Time spent: 35 mins.More than 50% of that time was spent in counseling and/or coordination of care.      Shelly Coss, MD [facility] Pager [phone]  If 7PM-7AM, please contact night-coverage [email or web address] Password [facility] 10/04/2018, 11:43 AM

## 2018-10-05 ENCOUNTER — Telehealth: Payer: Self-pay | Admitting: Cardiovascular Disease

## 2018-10-05 DIAGNOSIS — I3139 Other pericardial effusion (noninflammatory): Secondary | ICD-10-CM

## 2018-10-05 DIAGNOSIS — I313 Pericardial effusion (noninflammatory): Secondary | ICD-10-CM

## 2018-10-05 LAB — CBC WITH DIFFERENTIAL/PLATELET
Abs Immature Granulocytes: 0.19 10*3/uL — ABNORMAL HIGH (ref 0.00–0.07)
Basophils Absolute: 0 10*3/uL (ref 0.0–0.1)
Basophils Relative: 0 %
Eosinophils Absolute: 0 10*3/uL (ref 0.0–0.5)
Eosinophils Relative: 0 %
HCT: 28 % — ABNORMAL LOW (ref 36.0–46.0)
Hemoglobin: 8.7 g/dL — ABNORMAL LOW (ref 12.0–15.0)
Immature Granulocytes: 2 %
Lymphocytes Relative: 9 %
Lymphs Abs: 0.8 10*3/uL (ref 0.7–4.0)
MCH: 26.3 pg (ref 26.0–34.0)
MCHC: 31.1 g/dL (ref 30.0–36.0)
MCV: 84.6 fL (ref 80.0–100.0)
Monocytes Absolute: 0.4 10*3/uL (ref 0.1–1.0)
Monocytes Relative: 5 %
Neutro Abs: 6.8 10*3/uL (ref 1.7–7.7)
Neutrophils Relative %: 84 %
Platelets: 721 10*3/uL — ABNORMAL HIGH (ref 150–400)
RBC: 3.31 MIL/uL — ABNORMAL LOW (ref 3.87–5.11)
RDW: 15.2 % (ref 11.5–15.5)
WBC: 8.2 10*3/uL (ref 4.0–10.5)
nRBC: 0 % (ref 0.0–0.2)

## 2018-10-05 LAB — BASIC METABOLIC PANEL
Anion gap: 9 (ref 5–15)
BUN: 19 mg/dL (ref 8–23)
CALCIUM: 8.6 mg/dL — AB (ref 8.9–10.3)
CO2: 22 mmol/L (ref 22–32)
Chloride: 109 mmol/L (ref 98–111)
Creatinine, Ser: 1.39 mg/dL — ABNORMAL HIGH (ref 0.44–1.00)
GFR calc Af Amer: 47 mL/min — ABNORMAL LOW (ref >60)
GFR calc non Af Amer: 40 mL/min — ABNORMAL LOW (ref >60)
Glucose, Bld: 114 mg/dL — ABNORMAL HIGH (ref 70–99)
Potassium: 4.6 mmol/L (ref 3.5–5.1)
SODIUM: 140 mmol/L (ref 135–145)

## 2018-10-05 LAB — ECHOCARDIOGRAM COMPLETE
Height: 66 in
Weight: 3492.09 oz

## 2018-10-05 LAB — STREP PNEUMONIAE URINARY ANTIGEN: Strep Pneumo Urinary Antigen: NEGATIVE

## 2018-10-05 LAB — TSH: TSH: 2.916 u[IU]/mL (ref 0.350–4.500)

## 2018-10-05 MED ORDER — ALPRAZOLAM 0.5 MG PO TABS: 0.5000 mg | ORAL_TABLET | Freq: Three times a day (TID) | ORAL | Status: DC | PRN

## 2018-10-05 MED ORDER — CEFDINIR 300 MG PO CAPS: 300.0000 mg | ORAL_CAPSULE | Freq: Two times a day (BID) | ORAL | 0 refills | Status: DC

## 2018-10-05 MED ORDER — ALPRAZOLAM 0.5 MG PO TABS: 0.5000 mg | ORAL_TABLET | Freq: Three times a day (TID) | ORAL | 0 refills | Status: DC | PRN

## 2018-10-05 MED ORDER — ENSURE ENLIVE PO LIQD: 237.0000 mL | Freq: Every day | ORAL | Status: DC

## 2018-10-05 NOTE — Telephone Encounter (Signed)
Received call from hospitalist Pt sent to [facility] from [facility] for PE   ( No echo there ) Echo here showed small circumferential PE   Pt has hx of RA Pt without CP   NOthing to suggest pericarditis   Recomm Check TSH if not done  If comfortable OK to d/c  I told him we would make sure pt had f/u as outpt in clinic in 4 to 6 wks  Scheduled  limited echo for that visit to see if resolved

## 2018-10-05 NOTE — Plan of Care (Signed)

## 2018-10-05 NOTE — Progress Notes (Signed)
Discharged home accompanied by sister , discharge instructions given to pt. Belongings taken home.

## 2018-10-05 NOTE — Discharge Summary (Signed)
Physician Discharge Summary  Rebekah [name] ZMO:[id number] DOB: [birth year] DOA: 10/03/2018  PCP: [name], MD  Admit date: 10/03/2018 Discharge date: 10/05/2018  Admitted From: Home Disposition:  Home  Discharge Condition:Stable CODE STATUS:FULL Diet recommendation: Heart Healthy  Brief/Interim Summary: Patient is a 63 year old female with past medical history of rheumatoid arthritis, hypertension, chronic kidney disease stage III with baseline creatinine of 1.5 who was having exertional dyspnea on and off for last 3 weeks.    CT chest done at [facility]  had shown right lower lobe pneumonia and she was started on ceftriaxone azithromycin.  CT chest also showed pericardial effusion and she was sent here for echardiogram and cardiology evaluation. We  continued treatment for an pneumonia here.   Currently her respiratory status has significantly improved.  Antibiotics changed to oral on discharge. Echocardiogram showed just small pericardial effusion.  Discussed with cardiology , no need for further intervention.  She will follow-up with cardiology as an outpatient.    Following problems were addressed during her hospitalization:  Pericardial effusion as per CT scan: Echocardiogram showed ejection fraction of 66-65%, no wall motion abnormality, small pericardial effusion.  Discussed with cardiology, no need for further intervention.  She will be followed by cardiology as an outpatient for follow-up. Patient denies chest pain.  She is hemodynamically stable.  Community-acquired pneumonia: CT chest done at the previous hospital showed right-sided pneumonia.  Chest x-ray done here today showed increased aeration of the right lung, bilateral small pleural effusions.  Was on ceftriaxone and azithromycin.  Negative blood cultures.  Currently respiratory status stable.  Saturating fine on room air.  Antibiotics changed to oral on discharge  Hypertension: Currently blood pressure  stable.    Resume home medications  History of rheumatoid arthritis: On Plaquenil . Continue   CKD stage III: Currently stable and kidney function is at baseline.  Anxiety: Continue Xanax as needed.    Discharge Diagnoses:  Principal Problem:   Pneumonia Active Problems:   Rheumatoid arthritis involving multiple sites with positive rheumatoid factor (HCC)   Pericardial effusion   Benign essential HTN   Chronic kidney disease (CKD), stage IV (severe) (HCC)    Discharge Instructions  Discharge Instructions    Diet - low sodium heart healthy   Complete by:  As directed    Increase activity slowly   Complete by:  As directed      Allergies as of 10/05/2018   No Known Allergies     Medication List    STOP taking these medications   azithromycin 250 MG tablet Commonly known as:  ZITHROMAX     TAKE these medications   ALPRAZolam 0.5 MG tablet Commonly known as:  XANAX Take 1 tablet (0.5 mg total) by mouth 3 (three) times daily as needed for anxiety.   amLODipine 5 MG tablet Commonly known as:  NORVASC Take 5 mg by mouth daily.   atenolol 100 MG tablet Commonly known as:  TENORMIN Take 100 mg by mouth 2 (two) times daily.   cefdinir 300 MG capsule Commonly known as:  OMNICEF Take 1 capsule (300 mg total) by mouth 2 (two) times daily.   furosemide 40 MG tablet Commonly known as:  LASIX Take 40 mg by mouth daily.   hydroxychloroquine 200 MG tablet Commonly known as:  PLAQUENIL Take 200 mg by mouth 2 (two) times daily.      Follow-up Information    [name], MD. Schedule an appointment as soon as possible for a visit in  1 week(s).   Specialty:  Family Medicine Contact information: [place] [address] [phone]          No Known Allergies  Consultations:  Cardiology   Procedures/Studies: Dg Chest 2 View  Result Date: 10/04/2018 CLINICAL DATA:  Shortness of breath. EXAM: CHEST - 2 VIEW COMPARISON:  Chest radiograph  and CT from 10/03/2018 FINDINGS: Stable enlargement of the cardiac silhouette and related to the known pericardial effusion. Patchy densities in the lower chest are similar to the previous examination compatible with areas of volume loss and small loculated pleural effusions. Slightly improved aeration in the mid and lower right chest. Negative for a pneumothorax. IMPRESSION: 1. Slightly improved aeration in the right lower chest compared to the recent comparison examination. 2. Stable enlargement of the cardiac silhouette related to the pericardial effusion. 3. Densities in the lower chest bilaterally related to small loculated pleural effusions and volume loss. Electronically Signed   By: Markus Daft M.D.   On: 10/04/2018 10:22       Subjective: Patient seen and examined bedside this morning.  Remains comfortable.  Hemodynamically stable.  Respiratory status stable.  Stable for discharge to home.  Discharge planning discussed.  Discharge Exam: Vitals:   10/05/18 0755 10/05/18 1217  BP: 131/76 132/78  Pulse:  (!) 103  Resp: 16 (!) 28  Temp: 98.4 F (36.9 C) 98.3 F (36.8 C)  SpO2:  92%   Vitals:   10/04/18 2313 10/05/18 0242 10/05/18 0755 10/05/18 1217  BP: 126/69 126/69 131/76 132/78  Pulse: 96 (!) 101  (!) 103  Resp: 17 (!) 26 16 (!) 28  Temp: 99.1 F (37.3 C) 98.7 F (37.1 C) 98.4 F (36.9 C) 98.3 F (36.8 C)  TempSrc: Oral Oral Oral Oral  SpO2: 92% 92%  92%  Weight:      Height:        General: Pt is alert, awake, not in acute distress Cardiovascular: RRR, S1/S2 +, no rubs, no gallops Respiratory: CTA bilaterally, no wheezing, no rhonchi Abdominal: Soft, NT, ND, bowel sounds + Extremities: no edema, no cyanosis    The results of significant diagnostics from this hospitalization (including imaging, microbiology, ancillary and laboratory) are listed below for reference.     Microbiology: Recent Results (from the past 240 hour(s))  Culture, blood (routine x 2) Call  MD if unable to obtain prior to antibiotics being given     Status: None (Preliminary result)   Collection Time: 10/03/18 10:05 PM  Result Value Ref Range Status   Specimen Description BLOOD LEFT ANTECUBITAL  Final   Special Requests   Final    BOTTLES DRAWN AEROBIC ONLY Blood Culture results may not be optimal due to an excessive volume of blood received in culture bottles   Culture   Final    NO GROWTH 2 DAYS Performed at [facility] [address] [place], [place] [address]    Report Status PENDING  Incomplete  Culture, blood (routine x 2) Call MD if unable to obtain prior to antibiotics being given     Status: None (Preliminary result)   Collection Time: 10/03/18 10:05 PM  Result Value Ref Range Status   Specimen Description BLOOD LEFT HAND  Final   Special Requests   Final    BOTTLES DRAWN AEROBIC ONLY Blood Culture results may not be optimal due to an excessive volume of blood received in culture bottles   Culture   Final    NO GROWTH  2 DAYS Performed at [facility], [place] [address], [place], [place] [address]    Report Status PENDING  Incomplete  MRSA PCR Screening     Status: None   Collection Time: 10/04/18  1:43 AM  Result Value Ref Range Status   MRSA by PCR NEGATIVE NEGATIVE Final    Comment:        The GeneXpert MRSA Assay (FDA approved for NASAL specimens only), is one component of a comprehensive MRSA colonization surveillance program. It is not intended to diagnose MRSA infection nor to guide or monitor treatment for MRSA infections. Performed at [facility], [place] [address] [place], [place] [address]      Labs: BNP (last 3 results) No results for input(s): BNP in the last 8760 hours. Basic Metabolic Panel: Recent Labs  Lab 10/03/18 2205 10/04/18 0222 10/05/18 0215  NA  --  138 140  K  --  4.8 4.6  CL  --  105 109  CO2  --  22 22  GLUCOSE  --  151* 114*  BUN  --  16 19  CREATININE 1.50* 1.43* 1.39*  CALCIUM  --  8.5* 8.6*    Liver Function Tests: Recent Labs  Lab 10/04/18 0222  AST 42*  ALT 54*  ALKPHOS 62  BILITOT 0.7  PROT 6.6  ALBUMIN 2.2*   No results for input(s): LIPASE, AMYLASE in the last 168 hours. No results for input(s): AMMONIA in the last 168 hours. CBC: Recent Labs  Lab 10/03/18 2205 10/05/18 0215  WBC 11.9* 8.2  NEUTROABS  --  6.8  HGB 8.5* 8.7*  HCT 27.6* 28.0*  MCV 84.4 84.6  PLT 558* 721*   Cardiac Enzymes: No results for input(s): CKTOTAL, CKMB, CKMBINDEX, TROPONINI in the last 168 hours. BNP: Invalid input(s): POCBNP CBG: No results for input(s): GLUCAP in the last 168 hours. D-Dimer No results for input(s): DDIMER in the last 72 hours. Hgb A1c No results for input(s): HGBA1C in the last 72 hours. Lipid Profile No results for input(s): CHOL, HDL, LDLCALC, TRIG, CHOLHDL, LDLDIRECT in the last 72 hours. Thyroid function studies Recent Labs    10/05/18 1337  TSH 2.916   Anemia work up No results for input(s): VITAMINB12, FOLATE, FERRITIN, TIBC, IRON, RETICCTPCT in the last 72 hours. Urinalysis No results found for: COLORURINE, APPEARANCEUR, [place], [place], [place], [place], [place], [place], PROTEINUR, UROBILINOGEN, NITRITE, LEUKOCYTESUR Sepsis Labs Invalid input(s): PROCALCITONIN,  WBC,  LACTICIDVEN Microbiology Recent Results (from the past 240 hour(s))  Culture, blood (routine x 2) Call MD if unable to obtain prior to antibiotics being given     Status: None (Preliminary result)   Collection Time: 10/03/18 10:05 PM  Result Value Ref Range Status   Specimen Description BLOOD LEFT ANTECUBITAL  Final   Special Requests   Final    BOTTLES DRAWN AEROBIC ONLY Blood Culture results may not be optimal due to an excessive volume of blood received in culture bottles   Culture   Final    NO GROWTH 2 DAYS Performed at [facility], [place] [address] [place], [place] [address]    Report Status PENDING  Incomplete  Culture, blood (routine x 2) Call MD if  unable to obtain prior to antibiotics being given     Status: None (Preliminary result)   Collection Time: 10/03/18 10:05 PM  Result Value Ref Range Status   Specimen Description BLOOD LEFT HAND  Final   Special Requests   Final  BOTTLES DRAWN AEROBIC ONLY Blood Culture results may not be optimal due to an excessive volume of blood received in culture bottles   Culture   Final    NO GROWTH 2 DAYS Performed at [facility] [address], [place], [place] [address]    Report Status PENDING  Incomplete  MRSA PCR Screening     Status: None   Collection Time: 10/04/18  1:43 AM  Result Value Ref Range Status   MRSA by PCR NEGATIVE NEGATIVE Final    Comment:        The GeneXpert MRSA Assay (FDA approved for NASAL specimens only), is one component of a comprehensive MRSA colonization surveillance program. It is not intended to diagnose MRSA infection nor to guide or monitor treatment for MRSA infections. Performed at [facility], [place] [address], [place], [place] [address]     Please note: You were cared for by a hospitalist during your hospital stay. Once you are discharged, your primary care physician will handle any further medical issues. Please note that NO REFILLS for any discharge medications will be authorized once you are discharged, as it is imperative that you return to your primary care physician (or establish a relationship with a primary care physician if you do not have one) for your post hospital discharge needs so that they can reassess your need for medications and monitor your lab values.    Time coordinating discharge: 40 minutes  SIGNED:   Shelly Coss, MD  [facility] 10/05/2018, 2:49 PM Pager [phone]  If 7PM-7AM, please contact night-coverage [email or web address] Password [email or web address]

## 2018-10-05 NOTE — Progress Notes (Signed)
Initial Nutrition Assessment  DOCUMENTATION CODES:   Obesity unspecified  INTERVENTION:   -Ensure Enlive po daily, each supplement provides 350 kcal and 20 grams of protein  NUTRITION DIAGNOSIS:   Inadequate oral intake related to decreased appetite as evidenced by per patient/family report.  GOAL:   Patient will meet greater than or equal to 90% of their needs  MONITOR:   PO intake, Supplement acceptance, Labs, Weight trends, Skin, I & O's  REASON FOR ASSESSMENT:   Malnutrition Screening Tool    ASSESSMENT:   Rebekah [name] is a 63 y.o. female with medical history significant of rheumatoid arthritis, hypertension, chronic kidney disease stage III with baseline creatinine 1.5, no known cardiac history who apparently started having exertional dyspnea on and off for the last 3 weeks.  It got worse yesterday.  Patient was seen by her PCP when she first started having dyspnea and was treated for community-acquired pneumonia.  She was initiated with ceftriaxone and azithromycin in the ER today.  CT chest showed right lower lobe pneumonia and antibiotics were switched to Zosyn.  Patient has had new oxygen requirements.  She was placed on 8 L with oxygen sat of 94% over there.  She was also found to have moderate new pericardial effusion on CT.  She has no hemodynamic instability and no tamponade but patient requires cardiology evaluation.  They were unable to get echocardiogram or cardiologist available over the weekend.  She was sent over for further treatment.  Pt admitted with CAP and moderate pericardial effusion.   Case discussed with RN prior to visit, who reports that pt has a good appetite and taking medications without difficulty.   Spoke with pt at bedside, who reports not feeling well for the past 3 weeks. Pt complains of decreased appetite, early satiety, and weakness during this time. Typical intake was 3 meals per day (Breakfast: 2 bites of sausage biscuit; Lunch: salad;  Dinner: sandwich or soup). Pt typically consumes 3 meals per day when feeling well.   Pt reports improved intake since being in the hospital (consumed 100% of Healthy Choice meal when admitted). Pt reports consuming about 75% of breakfast (documented meal completion 90%).   Pt reports UBW is around 220#. She estimates she has lost 5-6# within the past month. However, no wt hx available to assess changes.   Discussed with pt importance of good meal and supplement intake to promote healing. Pt amenable to Ensure supplements.  Medications reviewed and include prednisone.   Labs reviewed.   NUTRITION - FOCUSED PHYSICAL EXAM:    Most Recent Value  Orbital Region  No depletion  Upper Arm Region  No depletion  Thoracic and Lumbar Region  No depletion  Buccal Region  No depletion  Temple Region  No depletion  Clavicle Bone Region  No depletion  Clavicle and Acromion Bone Region  No depletion  Scapular Bone Region  No depletion  Dorsal Hand  No depletion  Patellar Region  No depletion  Anterior Thigh Region  No depletion  Posterior Calf Region  No depletion  Edema (RD Assessment)  Mild  Hair  Reviewed  Eyes  Reviewed  Mouth  Reviewed  Skin  Reviewed  Nails  Reviewed       Diet Order:   Diet Order            Diet Heart Room service appropriate? Yes; Fluid consistency: Thin  Diet effective now  EDUCATION NEEDS:   Education needs have been addressed  Skin:  Skin Assessment: Reviewed RN Assessment  Last BM:  10/04/18  Height:   Ht Readings from Last 1 Encounters:  10/03/18 5\' 6"  (1.676 m)    Weight:   Wt Readings from Last 1 Encounters:  10/03/18 99 kg    Ideal Body Weight:  59.1 kg  BMI:  Body mass index is 35.23 kg/m.  Estimated Nutritional Needs:   Kcal:  1610-9604  Protein:  85-100 grams  Fluid:  1.7-1.9 L    Chalmers Iddings A. Jimmye Norman, RD, LDN, CDE Pager: [phone] After hours Pager: [phone]

## 2018-10-07 NOTE — Telephone Encounter (Signed)
Order placed for ltd echo to be done same day as follow up. Message sent to scheduling and echo scheduler to arrange.

## 2018-10-07 NOTE — Addendum Note (Signed)
Addended by: Rodman Key on: 10/07/2018 02:12 PM   Modules accepted: Orders

## 2018-10-08 LAB — CULTURE, BLOOD (ROUTINE X 2)
Culture: NO GROWTH
Culture: NO GROWTH

## 2018-10-14 ENCOUNTER — Telehealth: Payer: Self-pay | Admitting: Pulmonary Disease

## 2018-10-14 ENCOUNTER — Ambulatory Visit (INDEPENDENT_AMBULATORY_CARE_PROVIDER_SITE_OTHER): Payer: PRIVATE HEALTH INSURANCE | Admitting: Family Medicine

## 2018-10-14 ENCOUNTER — Encounter: Payer: Self-pay | Admitting: Family Medicine

## 2018-10-14 ENCOUNTER — Ambulatory Visit (INDEPENDENT_AMBULATORY_CARE_PROVIDER_SITE_OTHER): Payer: PRIVATE HEALTH INSURANCE

## 2018-10-14 VITALS — BP 130/72 | HR 81 | Temp 97.7°F | Ht 66.0 in | Wt 216.1 lb

## 2018-10-14 DIAGNOSIS — J181 Lobar pneumonia, unspecified organism: Secondary | ICD-10-CM

## 2018-10-14 DIAGNOSIS — J189 Pneumonia, unspecified organism: Secondary | ICD-10-CM

## 2018-10-14 NOTE — Progress Notes (Signed)
Subjective:  Patient ID: Rebekah [name], female    DOB: 1955/10/04  Age: 63 y.o. MRN: [id number]  CC: Hospitalization Follow-up   HPI Rebekah [name] presents for Follow-up on her hospitalization.Patient was hospitalized from January 18-20 for pneumonia.  She was discharged after having an echocardiogram showing that she had some pericardial fluid but not enough to be significant.  Her ejection fraction was 60% approximately.  That report was reviewed in detail.  Her discharge summary shows that she had bilateral small pleural effusions and right sided pneumonia.  She was treated with ceftriaxone and azithromycin.  Patient was discharged on cefdinir/Omnicef.  Patient tells me she just finished her last dose yesterday.  Patient's main concern today is poor energy and she has some dyspnea on exertion for more than just light activities moving around the home.  She has to sit down and rest.  She has a light cough.  Some clear production noted.  Depression screen Rebekah [name] 2/9 10/14/2018 05/14/2016 01/02/2016  Decreased Interest 3 0 3  Down, Depressed, Hopeless 2 0 2  PHQ - 2 Score 5 0 5  Altered sleeping 3 3 3   Tired, decreased energy 3 0 3  Change in appetite 3 0 2  Feeling bad or failure about yourself  1 0 0  Trouble concentrating 2 0 0  Moving slowly or fidgety/restless 2 0 0  Suicidal thoughts 0 0 0  PHQ-9 Score 19 3 13   Difficult doing work/chores - - Not difficult at all    History Rebekah [name] has a past medical history of Arthritis.   She has a past surgical history that includes Cesarean section.   Her family history includes Cancer in her father and mother; Diabetes in her mother; Hypertension in her brother, brother, mother, sister, sister, sister, sister, sister, and sister.She reports that she has never smoked. She has never used smokeless tobacco. She reports that she does not drink alcohol or use drugs.    ROS Review of Systems  Constitutional: Positive for appetite change  (Patient reports she is having no appetite just eats a few bites and is done.) and fatigue.  HENT: Negative.   Eyes: Negative for visual disturbance.  Respiratory: Positive for cough and shortness of breath.   Cardiovascular: Negative for chest pain.  Gastrointestinal: Negative for abdominal pain.  Musculoskeletal: Negative for arthralgias.    Objective:  BP 130/72   Pulse 81   Temp 97.7 F (36.5 C) (Oral)   Ht 5\' 6"  (1.676 m)   Wt 216 lb 2 oz (98 kg)   BMI 34.88 kg/m   BP Readings from Last 3 Encounters:  10/14/18 130/72  10/05/18 132/78  05/14/16 (!) 177/87    Wt Readings from Last 3 Encounters:  10/14/18 216 lb 2 oz (98 kg)  10/03/18 218 lb 4.1 oz (99 kg)  05/14/16 223 lb 9.6 oz (101.4 kg)     Physical Exam Constitutional:      General: She is not in acute distress.    Appearance: She is well-developed.  HENT:     Head: Normocephalic and atraumatic.  Eyes:     Conjunctiva/sclera: Conjunctivae normal.     Pupils: Pupils are equal, round, and reactive to light.  Neck:     Musculoskeletal: Normal range of motion and neck supple.     Thyroid: No thyromegaly.  Cardiovascular:     Rate and Rhythm: Normal rate and regular rhythm.     Heart sounds: Normal heart sounds. No murmur.  Pulmonary:  Effort: Pulmonary effort is normal. No respiratory distress.     Breath sounds: Wheezing and rhonchi present. No rales.  Abdominal:     General: Bowel sounds are normal. There is no distension.     Palpations: Abdomen is soft.     Tenderness: There is no abdominal tenderness.  Musculoskeletal: Normal range of motion.  Lymphadenopathy:     Cervical: No cervical adenopathy.  Skin:    General: Skin is warm and dry.  Neurological:     Mental Status: She is alert and oriented to person, place, and time.  Psychiatric:        Behavior: Behavior normal.        Thought Content: Thought content normal.        Judgment: Judgment normal.    CXR - bilateral  effusions   Assessment & Plan:   Rebekah [name] was seen today for hospitalization follow-up.  Diagnoses and all orders for this visit:  Pneumonia of right middle lobe due to infectious organism [facility]) -     DG Chest 2 View; Future -     Ambulatory referral to Pulmonology       I have discontinued [name]'s cefdinir. I am also having her maintain her hydroxychloroquine, atenolol, amLODipine, furosemide, ALPRAZolam, and gabapentin.  Allergies as of 10/14/2018   No Known Allergies     Medication List       Accurate as of October 14, 2018  3:07 PM. Always use your most recent med list.        ALPRAZolam 0.5 MG tablet Commonly known as:  XANAX Take 1 tablet (0.5 mg total) by mouth 3 (three) times daily as needed for anxiety.   amLODipine 5 MG tablet Commonly known as:  NORVASC Take 5 mg by mouth daily.   atenolol 100 MG tablet Commonly known as:  TENORMIN Take 100 mg by mouth 2 (two) times daily.   furosemide 40 MG tablet Commonly known as:  LASIX Take 40 mg by mouth daily.   gabapentin 300 MG capsule Commonly known as:  NEURONTIN Take 300 mg by mouth at bedtime.   hydroxychloroquine 200 MG tablet Commonly known as:  PLAQUENIL Take 200 mg by mouth 2 (two) times daily.        Follow-up: Return in about 1 week (around 10/21/2018), or if symptoms worsen or fail to improve.  [name], M.D.

## 2018-10-15 ENCOUNTER — Encounter: Payer: Self-pay | Admitting: Pulmonary Disease

## 2018-10-15 ENCOUNTER — Ambulatory Visit (INDEPENDENT_AMBULATORY_CARE_PROVIDER_SITE_OTHER): Payer: PRIVATE HEALTH INSURANCE | Admitting: Pulmonary Disease

## 2018-10-15 VITALS — BP 118/74 | HR 81 | Ht 68.0 in | Wt 215.0 lb

## 2018-10-15 DIAGNOSIS — R9389 Abnormal findings on diagnostic imaging of other specified body structures: Secondary | ICD-10-CM

## 2018-10-15 DIAGNOSIS — R0602 Shortness of breath: Secondary | ICD-10-CM

## 2018-10-15 DIAGNOSIS — J181 Lobar pneumonia, unspecified organism: Secondary | ICD-10-CM | POA: Diagnosis not present

## 2018-10-15 NOTE — Patient Instructions (Signed)
Recent pneumonia  May take you a few more weeks to recover  We will obtain a CT scan of the chest in about 6 weeks Encouraged gradual increase in activity  Call with any other significant symptoms

## 2018-10-15 NOTE — Progress Notes (Signed)
Rebekah [name]    [id number]    Mar 06, 1956  Primary Care Physician:Stacks, Cletus Gash, MD  Referring Physician: [name], MD [place], [place] [id number]  Chief complaint:   Patient being seen for follow-up for recent pneumonia  HPI:  She was treated for pneumonia about 2 weeks ago in the hospital She reports shortness of breath with activity Denies any fevers or chills Decreased appetite Denies any night sweats Denies any chest pains or chest discomfort  No previous pneumonia  She does have a history of rheumatoid arthritis  Never smoker Also found to have pericardial effusion during the recent hospitalization  She is overall better but concerned about a slow recovery  Outpatient Encounter Medications as of 10/15/2018  Medication Sig  . ALPRAZolam (XANAX) 0.5 MG tablet Take 1 tablet (0.5 mg total) by mouth 3 (three) times daily as needed for anxiety.  Marland Kitchen amLODipine (NORVASC) 5 MG tablet Take 5 mg by mouth daily.  Marland Kitchen atenolol (TENORMIN) 100 MG tablet Take 100 mg by mouth 2 (two) times daily.  . furosemide (LASIX) 40 MG tablet Take 40 mg by mouth daily.  Marland Kitchen gabapentin (NEURONTIN) 300 MG capsule Take 300 mg by mouth at bedtime.  . hydroxychloroquine (PLAQUENIL) 200 MG tablet Take 200 mg by mouth 2 (two) times daily.    No facility-administered encounter medications on file as of 10/15/2018.     Allergies as of 10/15/2018  . (No Known Allergies)    Past Medical History:  Diagnosis Date  . Arthritis    Rheumatiod    Past Surgical History:  Procedure Laterality Date  . CESAREAN SECTION      Family History  Problem Relation Age of Onset  . Diabetes Mother   . Hypertension Mother   . Cancer Mother   . Cancer Father   . Hypertension Sister   . Hypertension Brother   . Hypertension Sister   . Hypertension Sister   . Hypertension Sister   . Hypertension Brother   . Hypertension Sister   . Hypertension Sister     Social History    Socioeconomic History  . Marital status: Single    Spouse name: Not on file  . Number of children: Not on file  . Years of education: Not on file  . Highest education level: Not on file  Occupational History  . Not on file  Social Needs  . Financial resource strain: Not on file  . Food insecurity:    Worry: Not on file    Inability: Not on file  . Transportation needs:    Medical: Not on file    Non-medical: Not on file  Tobacco Use  . Smoking status: Never Smoker  . Smokeless tobacco: Never Used  Substance and Sexual Activity  . Alcohol use: No    Alcohol/week: 0.0 standard drinks  . Drug use: No  . Sexual activity: Not on file  Lifestyle  . Physical activity:    Days per week: Not on file    Minutes per session: Not on file  . Stress: Not on file  Relationships  . Social connections:    Talks on phone: Not on file    Gets together: Not on file    Attends religious service: Not on file    Active member of club or organization: Not on file    Attends meetings of clubs or organizations: Not on file    Relationship status: Not on file  .  Intimate partner violence:    Fear of current or ex partner: Not on file    Emotionally abused: Not on file    Physically abused: Not on file    Forced sexual activity: Not on file  Other Topics Concern  . Not on file  Social History Narrative  . Not on file    Review of Systems  Constitutional: Positive for activity change and fatigue.  HENT: Negative.   Eyes: Negative.   Respiratory: Positive for cough and shortness of breath.   Cardiovascular: Negative.   Gastrointestinal: Negative.   Endocrine: Negative.     Vitals:   10/15/18 1103  BP: 118/74  Pulse: 81  SpO2: 98%     Physical Exam  Constitutional: She appears well-developed and well-nourished.  HENT:  Head: Normocephalic and atraumatic.  Eyes: Pupils are equal, round, and reactive to light. Conjunctivae are normal. Right eye exhibits no discharge. Left eye  exhibits no discharge.  Neck: Normal range of motion. Neck supple. No tracheal deviation present. No thyromegaly present.  Cardiovascular: Normal rate and regular rhythm.  Pulmonary/Chest: Effort normal and breath sounds normal. No respiratory distress. She has no wheezes.  Abdominal: Soft. Bowel sounds are normal. She exhibits no distension. There is no abdominal tenderness.   Data Reviewed: Hospital records reviewed Recent CT scan of the chest reviewed from recent hospitalization  Assessment:  Recent pneumonia -She did complete a course of antibiotics, no longer having symptoms suggesting an infectious process at present - Pleural effusion -Expectation is for the pleural effusion to resolve -Has no symptoms suggesting an ongoing infection  History of rheumatoid arthritis  Plan/Recommendations: Encouraged to continue staying active  Increase caloric intake  Repeat CT in 6 weeks will be appropriate to follow-up on recent pneumonia  Encouraged to call with any concerns   Sherrilyn Rist MD [facility] 10/15/2018, 11:17 AM  CC: [name], MD

## 2018-10-15 NOTE — Telephone Encounter (Signed)
error 

## 2018-10-19 ENCOUNTER — Telehealth: Payer: Self-pay | Admitting: Pulmonary Disease

## 2018-10-19 ENCOUNTER — Other Ambulatory Visit: Payer: Self-pay | Admitting: Family Medicine

## 2018-10-19 ENCOUNTER — Telehealth: Payer: Self-pay | Admitting: Family Medicine

## 2018-10-19 MED ORDER — MEGESTROL ACETATE 400 MG/10ML PO SUSP: 400.0000 mg | Freq: Two times a day (BID) | ORAL | 2 refills | Status: DC

## 2018-10-19 NOTE — Telephone Encounter (Signed)
Pt requesting something to be called in to help with appetite- pt was treated by AO last Thursday for PNA, has no appetite and no desire to eat.  Unable to eat more than crackers at this time.    Ph[facility]acy: [facility] in [place].

## 2018-10-19 NOTE — Telephone Encounter (Signed)
Spoke with pt, aware of recs.  Nothing further needed at this time- will close encounter.   

## 2018-10-19 NOTE — Telephone Encounter (Signed)
Dronabinol Megace  These are options that can be used She can call and discuss this with her primary doctor

## 2018-10-19 NOTE — Telephone Encounter (Signed)
I sent in the requested prescription 

## 2018-10-19 NOTE — Telephone Encounter (Signed)
Dr. Ander Slade, please advise if there is anything we can send in for pt to help increase her appetite. Thanks!

## 2018-10-20 NOTE — Telephone Encounter (Signed)
Pt aware.

## 2018-10-23 ENCOUNTER — Encounter: Payer: Self-pay | Admitting: Internal Medicine

## 2018-10-29 ENCOUNTER — Telehealth: Payer: Self-pay

## 2018-10-29 NOTE — Telephone Encounter (Signed)
New message    Just an FYI. We have made several attempts to contact this patient including sending a letter to schedule or reschedule their echocardiogram. We will be removing the patient from the echo WQ.   Thank you 

## 2018-11-04 ENCOUNTER — Telehealth: Payer: Self-pay | Admitting: Family Medicine

## 2018-11-04 NOTE — Telephone Encounter (Signed)
Pt. Needs to be seen for this. (I had asked her to follow up around 10/21/18) Thanks, WS

## 2018-11-04 NOTE — Telephone Encounter (Signed)
Pt called states since she has been released from hospital on January 20th and her appt here - she still has no energy. Pt would like advice. Please call at [phone].

## 2018-11-04 NOTE — Telephone Encounter (Signed)
Any advice on loss of appetite and weakness problems.

## 2018-11-04 NOTE — Telephone Encounter (Signed)
Appt made for patient for tomorrow per providers request

## 2018-11-05 ENCOUNTER — Telehealth: Payer: Self-pay | Admitting: Family Medicine

## 2018-11-05 ENCOUNTER — Inpatient Hospital Stay: Admission: RE | Admit: 2018-11-05 | Payer: PRIVATE HEALTH INSURANCE | Source: Ambulatory Visit

## 2018-11-05 ENCOUNTER — Ambulatory Visit: Payer: PRIVATE HEALTH INSURANCE | Admitting: Family Medicine

## 2018-11-05 ENCOUNTER — Other Ambulatory Visit: Payer: Self-pay | Admitting: Pulmonary Disease

## 2018-11-05 DIAGNOSIS — R0602 Shortness of breath: Secondary | ICD-10-CM

## 2018-11-05 NOTE — Telephone Encounter (Signed)
There is no medication for weakness. A healthy diet with plenty of protein should help. Otherwise, reschedule her for a time when weather is better. WS

## 2018-11-05 NOTE — Telephone Encounter (Signed)
Patient aware.

## 2018-11-05 NOTE — Telephone Encounter (Signed)
Returned patient's phone call stating that she feels weak and fatigue and is eating some better with Megace.  Patient drinking plenty of fluids

## 2018-11-06 ENCOUNTER — Encounter: Payer: Self-pay | Admitting: Family Medicine

## 2018-11-13 ENCOUNTER — Ambulatory Visit (INDEPENDENT_AMBULATORY_CARE_PROVIDER_SITE_OTHER): Payer: PRIVATE HEALTH INSURANCE

## 2018-11-13 ENCOUNTER — Ambulatory Visit (INDEPENDENT_AMBULATORY_CARE_PROVIDER_SITE_OTHER): Payer: PRIVATE HEALTH INSURANCE | Admitting: Family Medicine

## 2018-11-13 ENCOUNTER — Encounter: Payer: Self-pay | Admitting: Family Medicine

## 2018-11-13 VITALS — BP 131/82 | HR 88 | Temp 98.1°F | Ht 68.0 in | Wt 208.0 lb

## 2018-11-13 DIAGNOSIS — J189 Pneumonia, unspecified organism: Secondary | ICD-10-CM

## 2018-11-13 DIAGNOSIS — J181 Lobar pneumonia, unspecified organism: Secondary | ICD-10-CM | POA: Diagnosis not present

## 2018-11-13 DIAGNOSIS — M0579 Rheumatoid arthritis with rheumatoid factor of multiple sites without organ or systems involvement: Secondary | ICD-10-CM

## 2018-11-13 MED ORDER — PREDNISONE 20 MG PO TABS: ORAL_TABLET | ORAL | 0 refills | Status: DC

## 2018-11-13 NOTE — Progress Notes (Signed)
Subjective:  Patient ID: Rebekah [name], female    DOB: 1956/09/01  Age: 63 y.o. MRN: [id number]  CC: Hospitalization Follow-up   HPI [name] presents for recently hospitalized for pneumonia.  She also has rheumatoid arthritis and having a lot of pain from that.  She does have a relationship with the rheumatologist.  She intends to call them.  Currently she is continuing to have cough.  She is finished her antibiotics.  She is not short of breath.  PHQ indicative of depression noted below.  Symptoms reviewed.  Depression screen [facility] 2/9 11/13/2018 10/14/2018 05/14/2016  Decreased Interest 3 3 0  Down, Depressed, Hopeless 2 2 0  PHQ - 2 Score 5 5 0  Altered sleeping '3 3 3  ' Tired, decreased energy 3 3 0  Change in appetite 3 3 0  Feeling bad or failure about yourself  1 1 0  Trouble concentrating 2 2 0  Moving slowly or fidgety/restless 2 2 0  Suicidal thoughts 0 0 0  PHQ-9 Score '19 19 3  ' Difficult doing work/chores - - -    History Cristela has a past medical history of Arthritis.   She has a past surgical history that includes Cesarean section.   Her family history includes Cancer in her father and mother; Diabetes in her mother; Hypertension in her brother, brother, mother, sister, sister, sister, sister, sister, and sister.She reports that she has never smoked. She has never used smokeless tobacco. She reports that she does not drink alcohol or use drugs.    ROS Review of Systems  Constitutional: Negative.   HENT: Negative for congestion.   Eyes: Negative for visual disturbance.  Respiratory: Negative for shortness of breath.   Cardiovascular: Negative for chest pain.  Gastrointestinal: Negative for abdominal pain, constipation, diarrhea, nausea and vomiting.  Genitourinary: Negative for difficulty urinating.  Musculoskeletal: Negative for arthralgias and myalgias.  Neurological: Negative for headaches.  Psychiatric/Behavioral: Negative for sleep disturbance.      Objective:  BP 131/82   Pulse 88   Temp 98.1 F (36.7 C) (Oral)   Ht '5\' 8"'  (1.727 m)   Wt 208 lb (94.3 kg)   SpO2 99%   BMI 31.63 kg/m   BP Readings from Last 3 Encounters:  11/13/18 131/82  10/15/18 118/74  10/14/18 130/72    Wt Readings from Last 3 Encounters:  11/13/18 208 lb (94.3 kg)  10/15/18 215 lb (97.5 kg)  10/14/18 216 lb 2 oz (98 kg)     Physical Exam Constitutional:      General: She is not in acute distress.    Appearance: She is well-developed.  HENT:     Head: Normocephalic and atraumatic.     Right Ear: External ear normal.     Left Ear: External ear normal.     Nose: Nose normal.  Eyes:     Conjunctiva/sclera: Conjunctivae normal.     Pupils: Pupils are equal, round, and reactive to light.  Neck:     Musculoskeletal: Normal range of motion and neck supple.     Thyroid: No thyromegaly.  Cardiovascular:     Rate and Rhythm: Normal rate and regular rhythm.     Heart sounds: Normal heart sounds. No murmur.  Pulmonary:     Effort: Pulmonary effort is normal. No respiratory distress.     Breath sounds: Normal breath sounds. No wheezing or rales.  Abdominal:     General: Bowel sounds are normal. There is no distension.  Palpations: Abdomen is soft.     Tenderness: There is no abdominal tenderness.  Lymphadenopathy:     Cervical: No cervical adenopathy.  Skin:    General: Skin is warm and dry.  Neurological:     Mental Status: She is alert and oriented to person, place, and time.     Deep Tendon Reflexes: Reflexes are normal and symmetric.  Psychiatric:        Behavior: Behavior normal.        Thought Content: Thought content normal.        Judgment: Judgment normal.       Assessment & Plan:   Larhonda was seen today for hospitalization follow-up.  Diagnoses and all orders for this visit:  Pneumonia of right middle lobe due to infectious organism [facility]) -     DG Chest 2 View; Future -     CBC with Differential/Platelet -      CMP14+EGFR  Rheumatoid arthritis involving multiple sites with positive rheumatoid factor (HCC) -     Sedimentation rate -     ANA -     CYCLIC CITRUL PEPTIDE ANTIBODY, IGG/IGA -     Folate -     Vitamin B12  Other orders -     predniSONE (DELTASONE) 20 MG tablet; Oe BID for 14 days. Then One daily for 7 days. Then 1/2 daily for 7 days       I am having Kendrick Fries D. Pulse start on predniSONE. I am also having her maintain her hydroxychloroquine, atenolol, amLODipine, furosemide, ALPRAZolam, gabapentin, and megestrol.  Allergies as of 11/13/2018   No Known Allergies     Medication List       Accurate as of November 13, 2018 11:59 PM. Always use your most recent med list.        ALPRAZolam 0.5 MG tablet Commonly known as:  XANAX Take 1 tablet (0.5 mg total) by mouth 3 (three) times daily as needed for anxiety.   amLODipine 5 MG tablet Commonly known as:  NORVASC Take 5 mg by mouth daily.   atenolol 100 MG tablet Commonly known as:  TENORMIN Take 100 mg by mouth 2 (two) times daily.   furosemide 40 MG tablet Commonly known as:  LASIX Take 40 mg by mouth daily.   gabapentin 300 MG capsule Commonly known as:  NEURONTIN Take 300 mg by mouth at bedtime.   hydroxychloroquine 200 MG tablet Commonly known as:  PLAQUENIL Take 200 mg by mouth 2 (two) times daily.   megestrol 400 MG/10ML suspension Commonly known as:  MEGACE Take 10 mLs (400 mg total) by mouth 2 (two) times daily. For appetite stimulation   predniSONE 20 MG tablet Commonly known as:  DELTASONE Oe BID for 14 days. Then One daily for 7 days. Then 1/2 daily for 7 days        Follow-up: Return in about 3 months (around 02/11/2019).  [name], M.D.

## 2018-11-17 ENCOUNTER — Encounter: Payer: Self-pay | Admitting: Family Medicine

## 2018-11-26 ENCOUNTER — Other Ambulatory Visit: Payer: PRIVATE HEALTH INSURANCE

## 2018-12-01 ENCOUNTER — Other Ambulatory Visit: Payer: Self-pay | Admitting: Family Medicine

## 2018-12-02 NOTE — Telephone Encounter (Signed)
Last seen 11/13/2018

## 2018-12-04 ENCOUNTER — Other Ambulatory Visit: Payer: Self-pay | Admitting: Family Medicine

## 2018-12-04 NOTE — Telephone Encounter (Signed)
What is the name of the medication? Prednisone 20 mg  Have you contacted your pharmacy to request a refill? Yes  Which pharmacy would you like this sent to? [facility] in [place]   Patient notified that their request is being sent to the clinical staff for review and that they should receive a call once it is complete. If they do not receive a call within 24 hours they can check with their pharmacy or our office.

## 2018-12-05 NOTE — Telephone Encounter (Signed)
This should be at the pharmacy, it was ordered 3 days ago by Dr. Livia Snellen.

## 2018-12-05 NOTE — Telephone Encounter (Signed)
Patient aware and verbalizes understanding. 

## 2018-12-09 ENCOUNTER — Ambulatory Visit: Payer: PRIVATE HEALTH INSURANCE | Admitting: Pulmonary Disease

## 2018-12-14 ENCOUNTER — Ambulatory Visit: Payer: PRIVATE HEALTH INSURANCE | Admitting: Pulmonary Disease

## 2019-01-20 ENCOUNTER — Ambulatory Visit (INDEPENDENT_AMBULATORY_CARE_PROVIDER_SITE_OTHER): Payer: PRIVATE HEALTH INSURANCE | Admitting: Primary Care

## 2019-01-20 ENCOUNTER — Ambulatory Visit (INDEPENDENT_AMBULATORY_CARE_PROVIDER_SITE_OTHER): Payer: PRIVATE HEALTH INSURANCE

## 2019-01-20 ENCOUNTER — Encounter: Payer: Self-pay | Admitting: Primary Care

## 2019-01-20 ENCOUNTER — Other Ambulatory Visit: Payer: Self-pay

## 2019-01-20 VITALS — BP 150/94 | HR 89 | Temp 98.7°F | Ht 66.0 in | Wt 214.8 lb

## 2019-01-20 DIAGNOSIS — I313 Pericardial effusion (noninflammatory): Secondary | ICD-10-CM | POA: Diagnosis not present

## 2019-01-20 DIAGNOSIS — R0602 Shortness of breath: Secondary | ICD-10-CM

## 2019-01-20 DIAGNOSIS — R7989 Other specified abnormal findings of blood chemistry: Secondary | ICD-10-CM | POA: Diagnosis not present

## 2019-01-20 DIAGNOSIS — I3139 Other pericardial effusion (noninflammatory): Secondary | ICD-10-CM

## 2019-01-20 DIAGNOSIS — I10 Essential (primary) hypertension: Secondary | ICD-10-CM | POA: Diagnosis not present

## 2019-01-20 LAB — CBC
HCT: 36.4 % (ref 36.0–46.0)
Hemoglobin: 11.9 g/dL — ABNORMAL LOW (ref 12.0–15.0)
MCHC: 32.8 g/dL (ref 30.0–36.0)
MCV: 87.2 fl (ref 78.0–100.0)
Platelets: 405 10*3/uL — ABNORMAL HIGH (ref 150.0–400.0)
RBC: 4.18 Mil/uL (ref 3.87–5.11)
RDW: 23.4 % — ABNORMAL HIGH (ref 11.5–15.5)
WBC: 7.7 10*3/uL (ref 4.0–10.5)

## 2019-01-20 LAB — BASIC METABOLIC PANEL
BUN: 15 mg/dL (ref 6–23)
CO2: 25 mEq/L (ref 19–32)
Calcium: 9.1 mg/dL (ref 8.4–10.5)
Chloride: 104 mEq/L (ref 96–112)
Creatinine, Ser: 1.34 mg/dL — ABNORMAL HIGH (ref 0.40–1.20)
GFR: 48.28 mL/min — ABNORMAL LOW (ref >60.00)
Glucose, Bld: 95 mg/dL (ref 70–99)
Potassium: 3.6 mEq/L (ref 3.5–5.1)
Sodium: 139 mEq/L (ref 135–145)

## 2019-01-20 LAB — D-DIMER, QUANTITATIVE: D-Dimer, Quant: 2.64 mcg/mL FEU — ABNORMAL HIGH (ref <0.50)

## 2019-01-20 LAB — BRAIN NATRIURETIC PEPTIDE: Pro B Natriuretic peptide (BNP): 63 pg/mL (ref 0.0–100.0)

## 2019-01-20 NOTE — Patient Instructions (Addendum)
Office testing: EKG  Orders: CXR   Labs (cbc, bmet and bnp)  Recommendations: - Follow up with PCP regarding ? anxiety and elevated BP  - May consider Holter monitor   Referral: - Cardiology referral re: pericardial effusion and sob (outpatient follow-up after hospitalization in Jan)

## 2019-01-20 NOTE — Assessment & Plan Note (Addendum)
-   Cardiomegaly and small pericardial effusion seen on CXR in January  - Recommended outpatient follow-up with cardiology - Needs referral

## 2019-01-20 NOTE — Progress Notes (Signed)
@Patient  ID: Rebekah [name], female    DOB: [birth year], 63 y.o.   MRN: [id number]  Chief Complaint  Patient presents with  . Acute Visit    SOB x2 months    Referring provider: [name], MD  HPI: 63 year old female, never smoked. PMH significant for community acquired pneumonia, pericardial effusion, HTN, RA, CKD stage IV. Patient of Dr. Ander Slade, seen for initial consult on 10/15/18.   Previous [place] Encounter: 10/15/18- Initial consult/ Dr. Ander Slade She was treated for pneumonia about 2 weeks ago in the hospital She reports shortness of breath with activity Denies any fevers or chills Decreased appetite Denies any night sweats Denies any chest pains or chest discomfort No previous pneumonia She does have a history of rheumatoid arthritis Never smoker Also found to have pericardial effusion during the recent hospitalization She is overall better but concerned about a slow recovery  01/20/2019 Presents today for acute visit with complaints of shortness of breath since March. States that it comes on suddenly and does not last long. Associated chest/pleuretic pain. Symptoms do not wake her up at night. No GERD symptoms. Some normal stress and anxiety. She was given prescription for xanax during her most recent hospitalization in January but never took it. Denies cough or wheezing. Afebrile.    No Known Allergies   There is no immunization history on file for this patient.  Past Medical History:  Diagnosis Date  . Arthritis    Rheumatiod    Tobacco History: Social History   Tobacco Use  Smoking Status Never Smoker  Smokeless Tobacco Never Used   Counseling given: Not Answered   Outpatient Medications Prior to Visit  Medication Sig Dispense Refill  . amLODipine (NORVASC) 5 MG tablet Take 5 mg by mouth daily.    Marland Kitchen atenolol (TENORMIN) 100 MG tablet Take 100 mg by mouth 2 (two) times daily.    . hydroxychloroquine (PLAQUENIL) 200 MG tablet Take 200 mg by  mouth 2 (two) times daily.     . predniSONE (DELTASONE) 20 MG tablet TAKE 1 TABLET BY MOUTH TWICE DAILY FOR 14 DAYS AND 1 ONCE DAILY FOR 7 DAYS AND 1/2 (ONE-HALF) ONCE DAILY FOR 7 DAYS 39 tablet 0  . ALPRAZolam (XANAX) 0.5 MG tablet Take 1 tablet (0.5 mg total) by mouth 3 (three) times daily as needed for anxiety. 15 tablet 0  . furosemide (LASIX) 40 MG tablet Take 40 mg by mouth daily.    Marland Kitchen gabapentin (NEURONTIN) 300 MG capsule Take 300 mg by mouth at bedtime.    . megestrol (MEGACE) 400 MG/10ML suspension Take 10 mLs (400 mg total) by mouth 2 (two) times daily. For appetite stimulation 600 mL 2   No facility-administered medications prior to visit.     Review of Systems  Review of Systems  Constitutional: Negative.   HENT: Negative.   Respiratory: Positive for shortness of breath. Negative for cough and wheezing.   Cardiovascular: Negative for leg swelling.  Psychiatric/Behavioral: The patient is nervous/anxious.     Physical Exam  BP (!) 150/94 (BP Location: Right Arm, Cuff Size: Normal)   Pulse 89   Temp 98.7 F (37.1 C)   Ht 5\' 6"  (1.676 m)   Wt 214 lb 12.8 oz (97.4 kg)   SpO2 100%   BMI 34.67 kg/m  Physical Exam Constitutional:      General: She is not in acute distress.    Appearance: Normal appearance. She is obese. She is not ill-appearing.  HENT:  Left Ear: Tympanic membrane normal.     Nose: Nose normal.     Mouth/Throat:     Mouth: Mucous membranes are moist.     Pharynx: Oropharynx is clear.  Neck:     Musculoskeletal: Normal range of motion and neck supple.  Cardiovascular:     Rate and Rhythm: Normal rate and regular rhythm.  Pulmonary:     Effort: Pulmonary effort is normal.     Breath sounds: Normal breath sounds. No wheezing or rales.  Musculoskeletal: Normal range of motion.  Skin:    General: Skin is warm.  Neurological:     General: No focal deficit present.     Mental Status: She is alert and oriented to person, place, and time. Mental  status is at baseline.  Psychiatric:        Mood and Affect: Mood normal.        Behavior: Behavior normal.        Thought Content: Thought content normal.        Judgment: Judgment normal.      Lab Results:  CBC    Component Value Date/Time   WBC 7.7 01/20/2019 1456   RBC 4.18 01/20/2019 1456   HGB 11.9 (L) 01/20/2019 1456   HGB 10.4 (L) 01/02/2016 1625   HCT 36.4 01/20/2019 1456   HCT 33.0 (L) 01/02/2016 1625   PLT 405.0 (H) 01/20/2019 1456   PLT 668 (H) 01/02/2016 1625   MCV 87.2 01/20/2019 1456   MCV 88 01/02/2016 1625   MCH 26.3 10/05/2018 0215   MCHC 32.8 01/20/2019 1456   RDW 23.4 (H) 01/20/2019 1456   RDW 14.9 01/02/2016 1625   LYMPHSABS 0.8 10/05/2018 0215   LYMPHSABS 1.2 01/02/2016 1625   MONOABS 0.4 10/05/2018 0215   EOSABS 0.0 10/05/2018 0215   EOSABS 0.1 01/02/2016 1625   BASOSABS 0.0 10/05/2018 0215   BASOSABS 0.0 01/02/2016 1625    BMET    Component Value Date/Time   NA 139 01/20/2019 1456   NA 137 01/02/2016 1625   K 3.6 01/20/2019 1456   CL 104 01/20/2019 1456   CO2 25 01/20/2019 1456   GLUCOSE 95 01/20/2019 1456   BUN 15 01/20/2019 1456   BUN 28 (H) 01/02/2016 1625   CREATININE 1.34 (H) 01/20/2019 1456   CALCIUM 9.1 01/20/2019 1456   GFRNONAA 40 (L) 10/05/2018 0215   GFRAA 47 (L) 10/05/2018 0215    BNP No results found for: BNP  ProBNP    Component Value Date/Time   PROBNP 63.0 01/20/2019 1456    Imaging: Dg Chest 2 View  Result Date: 01/20/2019 CLINICAL DATA:  Shortness of breath for the past 2 months. EXAM: CHEST - 2 VIEW COMPARISON:  Chest x-ray dated November 13, 2018. FINDINGS: Unchanged mild cardiomegaly. Normal mediastinal contours. Normal pulmonary vascularity. Resolved bilateral pleural effusions. Unchanged scarring/atelectasis in the right middle lobe. No consolidation or pneumothorax. No acute osseous abnormality. IMPRESSION: 1. No active cardiopulmonary disease. Resolved bilateral pleural effusions. Electronically Signed    By: Titus Dubin M.D.   On: 01/20/2019 14:58     Assessment & Plan:   Shortness of breath 63 year old female, non-smoker. Treated for CAP in January 2020. Patient reports experiencing brief episodes of shortness of breath that come on suddenly. Some associated pleuritic pain. No active chest pain today. Absent cough or wheeze. CXR showed no acute cardiopulmonary disease, resolution of pleural effusions. EKG showed NSR, left atrial enlargement. May benefit from Holter monitor in the future. D-dimer  elevated, needs CTA r/o PE.   Pericardial effusion - Cardiomegaly and small pericardial effusion seen on CXR in January  - Recommended outpatient follow-up with cardiology - Needs referral   Benign essential HTN - Elevated; BP 150/90 - Needs to follow up with PCP   Martyn Ehrich, NP 01/20/2019

## 2019-01-20 NOTE — Assessment & Plan Note (Addendum)
63 year old female, non-smoker. Treated for CAP in January 2020. Patient reports experiencing brief episodes of shortness of breath that come on suddenly. Some associated pleuritic pain. No active chest pain today. Absent cough or wheeze. CXR showed no acute cardiopulmonary disease, resolution of pleural effusions. EKG showed NSR, left atrial enlargement. May benefit from Holter monitor in the future. D-dimer elevated, needs CTA r/o PE.

## 2019-01-20 NOTE — Assessment & Plan Note (Addendum)
-   Elevated; BP 150/90 - Needs to follow up with PCP

## 2019-01-20 NOTE — Assessment & Plan Note (Deleted)
-   Elevated today, advised patient contact PCP

## 2019-01-20 NOTE — Assessment & Plan Note (Deleted)
-   Recommended outpatient follow-up after hospitalization in January 2020 - Refer to cardiology

## 2019-01-21 ENCOUNTER — Ambulatory Visit (HOSPITAL_COMMUNITY)
Admission: RE | Admit: 2019-01-21 | Discharge: 2019-01-21 | Disposition: A | Discharge status: Home / Self Care | Payer: PRIVATE HEALTH INSURANCE | Source: Ambulatory Visit | Attending: Primary Care | Admitting: Primary Care

## 2019-01-21 DIAGNOSIS — R7989 Other specified abnormal findings of blood chemistry: Secondary | ICD-10-CM | POA: Diagnosis present

## 2019-01-21 DIAGNOSIS — R0602 Shortness of breath: Secondary | ICD-10-CM | POA: Diagnosis present

## 2019-01-21 MED ORDER — IOHEXOL 350 MG/ML SOLN
100.0000 mL | Freq: Once | INTRAVENOUS | Status: AC | PRN
  Administered 2019-01-21: 100 mL via INTRAVENOUS

## 2019-01-21 MED ORDER — IOHEXOL 300 MG/ML  SOLN: 100.0000 mL | Freq: Once | INTRAMUSCULAR | Status: DC | PRN

## 2019-01-22 ENCOUNTER — Other Ambulatory Visit: Payer: Self-pay

## 2019-01-22 ENCOUNTER — Telehealth: Payer: Self-pay | Admitting: Primary Care

## 2019-01-22 DIAGNOSIS — R911 Solitary pulmonary nodule: Secondary | ICD-10-CM

## 2019-01-22 NOTE — Addendum Note (Signed)
Addended by: Lorretta Harp on: 01/22/2019 10:33 AM   Modules accepted: Orders

## 2019-01-22 NOTE — Telephone Encounter (Signed)
Notes recorded by Martyn Ehrich, NP on 01/22/2019 at 9:05 AM EDT Please let patient know CTA showed no evidence of PE. Trace small bilateral pleural effusion, improved from prior. Improvement in pericardial effusion. Stable 7 mm pulmonary nodules in RUL, repeat CT chest in 12 months recommended. Please order CT chest w/o contrast.  Called and spoke with pt letting her know the results of the CTA and stated to pt that we would repeat scan in 1 year per recommendation guidelines from radiology. Pt expressed understanding. Order has been placed for CT to be repeated in 1 year.  Nothing further needed.

## 2019-01-22 NOTE — Addendum Note (Signed)
Addended by: Lorretta Harp on: 01/22/2019 11:06 AM   Modules accepted: Orders

## 2019-01-22 NOTE — Addendum Note (Signed)
Addended by: Lorretta Harp on: 01/22/2019 10:52 AM   Modules accepted: Orders

## 2019-01-25 ENCOUNTER — Telehealth: Payer: Self-pay | Admitting: Cardiology

## 2019-01-25 NOTE — Telephone Encounter (Signed)
Virtual Visit Pre-Appointment Phone Call  "(Name), I am calling you today to discuss your upcoming appointment. We are currently trying to limit exposure to the virus that causes COVID-19 by seeing patients at home rather than in the office."  1. "What is the BEST phone number to call the day of the visit?" - include this in appointment notes  2. Do you have or have access to (through a family member/friend) a smartphone with video capability that we can use for your visit?" a. If yes - list this number in appt notes as cell (if different from BEST phone #) and list the appointment type as a VIDEO visit in appointment notes b. If no - list the appointment type as a PHONE visit in appointment notes  3. Confirm consent - "In the setting of the current Covid19 crisis, you are scheduled for a (phone or video) visit with your provider on (date) at (time).  Just as we do with many in-office visits, in order for you to participate in this visit, we must obtain consent.  If you'd like, I can send this to your mychart (if signed up) or email for you to review.  Otherwise, I can obtain your verbal consent now.  All virtual visits are billed to your insurance company just like a normal visit would be.  By agreeing to a virtual visit, we'd like you to understand that the technology does not allow for your provider to perform an examination, and thus may limit your provider's ability to fully assess your condition. If your provider identifies any concerns that need to be evaluated in person, we will make arrangements to do so.  Finally, though the technology is pretty good, we cannot assure that it will always work on either your or our end, and in the setting of a video visit, we may have to convert it to a phone-only visit.  In either situation, we cannot ensure that we have a secure connection.  Are you willing to proceed?" STAFF: Did the patient verbally acknowledge consent to telehealth visit?  Document YES/NO here: yes  4. Advise patient to be prepared - "Two hours prior to your appointment, go ahead and check your blood pressure, pulse, oxygen saturation, and your weight (if you have the equipment to check those) and write them all down. When your visit starts, your provider will ask you for this information. If you have an Apple Watch or Kardia device, please plan to have heart rate information ready on the day of your appointment. Please have a pen and paper handy nearby the day of the visit as well."  5. Give patient instructions for MyChart download to smartphone OR [email or web address] as below if video visit (depending on what platform provider is using)  6. Inform patient they will receive a phone call 15 minutes prior to their appointment time (may be from unknown caller ID) so they should be prepared to answer    TELEPHONE CALL NOTE  Rebekah [name] has been deemed a candidate for a follow-up tele-health visit to limit community exposure during the Covid-19 pandemic. I spoke with the patient via phone to ensure availability of phone/video source, confirm preferred email & phone number, and discuss instructions and expectations.  I reminded Rebekah [name] to be prepared with any vital sign and/or heart rhythm information that could potentially be obtained via home monitoring, at the time of her visit. I reminded Rebekah [name] to expect a phone  call prior to her visit.  Rebekah [name] 01/25/2019 11:45 AM   INSTRUCTIONS FOR DOWNLOADING THE MYCHART APP TO SMARTPHONE  - The patient must first make sure to have activated MyChart and know their login information - If Apple, go to [employer] and type in MyChart in the search bar and download the app. If Android, ask patient to go to [employer] and type in [place] in the search bar and download the app. The app is free but as with any other app downloads, their phone may require them to verify saved payment information  or Apple/Android password.  - The patient will need to then log into the app with their MyChart username and password, and select [facility] as their healthcare provider to link the account. When it is time for your visit, go to the MyChart app, find appointments, and click Begin Video Visit. Be sure to Select Allow for your device to access the Microphone and Camera for your visit. You will then be connected, and your provider will be with you shortly.  **If they have any issues connecting, or need assistance please contact MyChart service desk [phone])**  **If using a computer, in order to ensure the best quality for their visit they will need to use either of the following Internet Browsers: [employer], or Google Chrome**  IF USING DOXIMITY or DOXY.ME - The patient will receive a link just prior to their visit by text.     FULL LENGTH CONSENT FOR TELE-HEALTH VISIT   I hereby voluntarily request, consent and authorize [facility] and its employed or contracted physicians, physician assistants, nurse practitioners or other licensed health care professionals (the Practitioner), to provide me with telemedicine health care services (the Services") as deemed necessary by the treating Practitioner. I acknowledge and consent to receive the Services by the Practitioner via telemedicine. I understand that the telemedicine visit will involve communicating with the Practitioner through live audiovisual communication technology and the disclosure of certain medical information by electronic transmission. I acknowledge that I have been given the opportunity to request an in-person assessment or other available alternative prior to the telemedicine visit and am voluntarily participating in the telemedicine visit.  I understand that I have the right to withhold or withdraw my consent to the use of telemedicine in the course of my care at any time, without affecting my right to future  care or treatment, and that the Practitioner or I may terminate the telemedicine visit at any time. I understand that I have the right to inspect all information obtained and/or recorded in the course of the telemedicine visit and may receive copies of available information for a reasonable fee.  I understand that some of the potential risks of receiving the Services via telemedicine include:   Delay or interruption in medical evaluation due to technological equipment failure or disruption;  Information transmitted may not be sufficient (e.g. poor resolution of images) to allow for appropriate medical decision making by the Practitioner; and/or   In rare instances, security protocols could fail, causing a breach of personal health information.  Furthermore, I acknowledge that it is my responsibility to provide information about my medical history, conditions and care that is complete and accurate to the best of my ability. I acknowledge that Practitioner's advice, recommendations, and/or decision may be based on factors not within their control, such as incomplete or inaccurate data provided by me or distortions of diagnostic images or specimens that may result from  electronic transmissions. I understand that the practice of medicine is not an exact science and that Practitioner makes no warranties or guarantees regarding treatment outcomes. I acknowledge that I will receive a copy of this consent concurrently upon execution via email to the email address I last provided but may also request a printed copy by calling the office of [facility].    I understand that my insurance will be billed for this visit.   I have read or had this consent read to me.  I understand the contents of this consent, which adequately explains the benefits and risks of the Services being provided via telemedicine.   I have been provided ample opportunity to ask questions regarding this consent and the Services and have  had my questions answered to my satisfaction.  I give my informed consent for the services to be provided through the use of telemedicine in my medical care  By participating in this telemedicine visit I agree to the above.

## 2019-01-25 NOTE — Progress Notes (Signed)
Virtual Visit via Video Note   This visit type was conducted due to national recommendations for restrictions regarding the COVID-19 Pandemic (e.g. social distancing) in an effort to limit this patient's exposure and mitigate transmission in our community.  Due to her co-morbid illnesses, this patient is at least at moderate risk for complications without adequate follow up.  This format is felt to be most appropriate for this patient at this time.  All issues noted in this document were discussed and addressed.  A limited physical exam was performed with this format.  Please refer to the patient's chart for her consent to telehealth for [facility].   Date:  01/26/2019   ID:  Rebekah [name], DOB Apr 16, 1956, MRN [id number]  Patient Location: Home Provider Location: Home  PCP:  [name], MD  Cardiologist:  Minus Breeding, MD  Electrophysiologist:  None   Evaluation Performed:  New Patient Evaluation  Chief Complaint:  SOB   History of Present Illness:    Rebekah [name] is a 63 y.o. female referred by [name], MD with SOB.   She was in the hospital in January with pneumonia.  She had an echo with a small pericardial effusion in January.  She wonders if she ever got completely over that.  She really started noticing more shortness of breath around March.  She has 8 stairs down to her mailbox and by the time she gets back from the mailbox and up the stairs she is short of breath.  She said this is somewhat mild.  She is not having any chest pressure, neck or arm discomfort.  She does not describe palpitations, presyncope or syncope.  She has had stable weights and no edema.  She is not had any cough fevers or chills.  Her most disturbing problem that she is bothered about is feeling like she occasionally has to take a deep sigh.  She is not describing PND or orthopnea.  She did have a CT of her chest I note and the effusion was said to have resolved around her heart  although there was some small pleural effusion.  She had some small nodules that will be reimaged again in 18 months but there were no other significant abnormalities.  The patient otherwise is able to do housework.  She does not get neck or arm discomfort.  She does not have nausea vomiting or diaphoresis.  She does not have palpitations, presyncope or syncope.  The patient does not have symptoms concerning for COVID-19 infection (fever, chills, cough, or new shortness of breath).    Past Medical History:  Diagnosis Date  . Arthritis    Rheumatoid  . Hypertension    Past Surgical History:  Procedure Laterality Date  . CESAREAN SECTION       Current Meds  Medication Sig  . amLODipine (NORVASC) 5 MG tablet Take 5 mg by mouth daily.  Marland Kitchen atenolol (TENORMIN) 100 MG tablet Take 100 mg by mouth 2 (two) times daily.  . hydroxychloroquine (PLAQUENIL) 200 MG tablet Take 200 mg by mouth 2 (two) times daily.   . Melatonin 3 MG CAPS Take 1 capsule by mouth at bedtime.     Allergies:   Patient has no known allergies.   Social History   Tobacco Use  . Smoking status: Never Smoker  . Smokeless tobacco: Never Used  Substance Use Topics  . Alcohol use: No    Alcohol/week: 0.0 standard drinks  . Drug use: No  Family Hx: The patient's family history includes Cancer in her father and mother; Diabetes in her mother; Hypertension in her brother, brother, mother, sister, sister, sister, sister, sister, and sister.  ROS:   Please see the history of present illness.    As stated in the HPI and negative for all other systems.   Prior CV studies:   The following studies were reviewed today:    Labs/Other Tests and Data Reviewed:    EKG:  01/20/19 sinus rhythm, rate 86, axis within normal limits, intervals within normal limits, no acute ST-T wave changes.  Recent Labs: 10/04/2018: ALT 54 10/05/2018: TSH 2.916 01/20/2019: BUN 15; Creatinine, Ser 1.34; Hemoglobin 11.9; Platelets 405.0;  Potassium 3.6; Pro B Natriuretic peptide (BNP) 63.0; Sodium 139   Recent Lipid Panel No results found for: CHOL, TRIG, HDL, CHOLHDL, LDLCALC, LDLDIRECT  Wt Readings from Last 3 Encounters:  01/26/19 214 lb (97.1 kg)  01/20/19 214 lb 12.8 oz (97.4 kg)  11/13/18 208 lb (94.3 kg)     Objective:    Vital Signs:  Ht 5\' 6"  (1.676 m)   Wt 214 lb (97.1 kg)   BMI 34.54 kg/m    VITAL SIGNS:  reviewed GEN:  no acute distress EYES:  sclerae anicteric, EOMI - Extraocular Movements Intact RESPIRATORY:  normal respiratory effort, symmetric expansion NEURO:  alert and oriented x 3, no obvious focal deficit PSYCH:  normal affect  ASSESSMENT & PLAN:    PERICARDIAL EFFUSION: I do not suspect that this is causing any problems.  It seems to have resolved on the CT.  Her BNP was normal.  No further cardiovascular work-up at this is indicated.  SOB: This is likely weight and deconditioning.  However, I am going to bring her back for a POET (Plain Old Exercise Treadmill)   OVERWEIGHT:  The patient understands the need to lose weight with diet and exercise. We have discussed specific strategies for this.  HTN: She thinks her blood pressure is well controlled.  We talked about how to keep a blood pressure diary.  I be happy to review this.  COVID-19 Education: The signs and symptoms of COVID-19 were discussed with the patient and how to seek care for testing (follow up with PCP or arrange E-visit).  The importance of social distancing was discussed today.  Time:   Today, I have spent 25 minutes with the patient with telehealth technology discussing the above problems.     Medication Adjustments/Labs and Tests Ordered: Current medicines are reviewed at length with the patient today.  Concerns regarding medicines are outlined above.   Tests Ordered: No orders of the defined types were placed in this encounter.   Medication Changes: No orders of the defined types were placed in this encounter.    Disposition:  Follow up prn  Signed, Minus Breeding, MD  01/26/2019 1:53 PM    [facility]

## 2019-01-26 ENCOUNTER — Telehealth (INDEPENDENT_AMBULATORY_CARE_PROVIDER_SITE_OTHER): Payer: PRIVATE HEALTH INSURANCE | Admitting: Cardiology

## 2019-01-26 ENCOUNTER — Encounter: Payer: Self-pay | Admitting: *Deleted

## 2019-01-26 ENCOUNTER — Encounter: Payer: Self-pay | Admitting: Cardiology

## 2019-01-26 ENCOUNTER — Telehealth: Payer: Self-pay | Admitting: Cardiology

## 2019-01-26 VITALS — Ht 66.0 in | Wt 214.0 lb

## 2019-01-26 DIAGNOSIS — I10 Essential (primary) hypertension: Secondary | ICD-10-CM | POA: Diagnosis not present

## 2019-01-26 DIAGNOSIS — I3139 Other pericardial effusion (noninflammatory): Secondary | ICD-10-CM

## 2019-01-26 DIAGNOSIS — R0602 Shortness of breath: Secondary | ICD-10-CM

## 2019-01-26 DIAGNOSIS — E663 Overweight: Secondary | ICD-10-CM

## 2019-01-26 DIAGNOSIS — I313 Pericardial effusion (noninflammatory): Secondary | ICD-10-CM

## 2019-01-26 NOTE — Telephone Encounter (Signed)
°  Precert needed for: ETT   Location: [facility]    Date:  Feb 02, 2019

## 2019-01-26 NOTE — Patient Instructions (Addendum)
Medication Instructions:   Your physician recommends that you continue on your current medications as directed. Please refer to the Current Medication list given to you today.  Labwork:  NONE  Testing/Procedures: Your physician has requested that you have an exercise tolerance test. For further information please visit [email or web address]. Please also follow instruction sheet, as given.  Follow-Up:  Your physician recommends that you schedule a follow-up appointment in: as needed  Any Other Special Instructions Will Be Listed Below (If Applicable).  If you need a refill on your cardiac medications before your next appointment, please call your pharmacy.

## 2019-01-28 ENCOUNTER — Other Ambulatory Visit: Payer: Self-pay | Admitting: Family Medicine

## 2019-02-01 ENCOUNTER — Telehealth: Payer: Self-pay | Admitting: Family Medicine

## 2019-02-02 ENCOUNTER — Other Ambulatory Visit: Payer: Self-pay | Admitting: Family Medicine

## 2019-02-02 ENCOUNTER — Encounter (HOSPITAL_COMMUNITY): Payer: PRIVATE HEALTH INSURANCE

## 2019-02-02 MED ORDER — PREDNISONE 20 MG PO TABS: ORAL_TABLET | ORAL | 0 refills | Status: DC

## 2019-02-02 NOTE — Telephone Encounter (Signed)
Patient aware.

## 2019-02-02 NOTE — Telephone Encounter (Signed)
Please advise, last RF 12/02/18

## 2019-02-02 NOTE — Telephone Encounter (Signed)
I sent in the requested prescription 

## 2019-02-19 ENCOUNTER — Encounter (HOSPITAL_COMMUNITY): Payer: PRIVATE HEALTH INSURANCE

## 2019-03-04 ENCOUNTER — Encounter (HOSPITAL_COMMUNITY): Payer: PRIVATE HEALTH INSURANCE

## 2019-03-17 ENCOUNTER — Encounter (HOSPITAL_COMMUNITY): Payer: PRIVATE HEALTH INSURANCE

## 2019-04-13 ENCOUNTER — Other Ambulatory Visit: Payer: Self-pay | Admitting: *Deleted

## 2019-04-13 ENCOUNTER — Telehealth: Payer: Self-pay | Admitting: *Deleted

## 2019-04-13 NOTE — Telephone Encounter (Signed)
Patient is scheduled for a GXT at [facility] and needs to be scheduled for covid testing prior to the test. Order placed for covid testing and patient will need to be called with instructions.

## 2019-04-14 NOTE — Telephone Encounter (Signed)
Left message to call back, needs COVID screening Thursday prior to Monday's GXT

## 2019-04-15 NOTE — Telephone Encounter (Signed)
Late entry... spoke with patient earlier. She could not go today for COVID screening. Message sent to schedulers to reschedule GXT Patient aware to quarantine after COVID test until GXT done

## 2019-04-16 ENCOUNTER — Encounter: Payer: Self-pay | Admitting: Cardiology

## 2019-04-16 NOTE — Telephone Encounter (Signed)
Patient scheduled for GXT on 8/7, needs COVID 19 test 8/4 at [facility] testing site across from [facility])  Scheduled and left message to call back

## 2019-04-19 ENCOUNTER — Encounter (HOSPITAL_COMMUNITY): Payer: PRIVATE HEALTH INSURANCE

## 2019-04-20 ENCOUNTER — Telehealth: Payer: Self-pay | Admitting: Cardiology

## 2019-04-20 ENCOUNTER — Other Ambulatory Visit (HOSPITAL_COMMUNITY)
Admission: RE | Admit: 2019-04-20 | Discharge: 2019-04-20 | Disposition: A | Discharge status: Home / Self Care | Payer: PRIVATE HEALTH INSURANCE | Source: Ambulatory Visit | Attending: Cardiology | Admitting: Cardiology

## 2019-04-20 ENCOUNTER — Other Ambulatory Visit: Payer: Self-pay

## 2019-04-20 NOTE — Telephone Encounter (Signed)
New message    Patient called this morning she lost her insurance and has to wait to get testing until after she obtains more insurance. I am mailing her a financial application out today

## 2019-04-20 NOTE — Telephone Encounter (Signed)
Just an FYI. Thanks!

## 2019-04-23 ENCOUNTER — Encounter (HOSPITAL_COMMUNITY): Payer: PRIVATE HEALTH INSURANCE

## 2019-04-30 ENCOUNTER — Other Ambulatory Visit: Payer: Self-pay | Admitting: Nephrology

## 2019-04-30 DIAGNOSIS — N183 Chronic kidney disease, stage 3 unspecified: Secondary | ICD-10-CM

## 2019-08-23 ENCOUNTER — Ambulatory Visit (INDEPENDENT_AMBULATORY_CARE_PROVIDER_SITE_OTHER): Payer: HRSA Program | Admitting: Family Medicine

## 2019-08-23 ENCOUNTER — Encounter: Payer: Self-pay | Admitting: Family Medicine

## 2019-08-23 ENCOUNTER — Other Ambulatory Visit: Payer: Self-pay

## 2019-08-23 DIAGNOSIS — U071 COVID-19: Secondary | ICD-10-CM

## 2019-08-23 MED ORDER — PREDNISONE 20 MG PO TABS: ORAL_TABLET | ORAL | 0 refills | Status: DC

## 2019-08-23 MED ORDER — BENZONATATE 200 MG PO CAPS: 200.0000 mg | ORAL_CAPSULE | Freq: Two times a day (BID) | ORAL | 0 refills | Status: DC | PRN

## 2019-08-23 NOTE — Progress Notes (Signed)
Virtual Visit via telephone Note  I connected with Rebekah [name] on 08/23/19 at 1420 by telephone and verified that I am speaking with the correct person using two identifiers. Rebekah [name] is currently located at home and no other people are currently with her during visit. The provider, Fransisca Kaufmann Hye Trawick, MD is located in their office at time of visit.  Call ended at 1430  I discussed the limitations, risks, security and privacy concerns of performing an evaluation and management service by telephone and the availability of in person appointments. I also discussed with the patient that there may be a patient responsible charge related to this service. The patient expressed understanding and agreed to proceed.   History and Present Illness: Patient is calling in for cough and congestion that has been improving.  Her sister is in the hospital for covid. The patient is having small cough and came back covid positive. Last exposure was 1 week ago and symptoms started 1 week ago. Patient has minimal symptoms. She is not using anything OTC   No diagnosis found.  Outpatient Encounter Medications as of 08/23/2019  Medication Sig  . amLODipine (NORVASC) 5 MG tablet Take 5 mg by mouth daily.  Marland Kitchen atenolol (TENORMIN) 100 MG tablet Take 100 mg by mouth 2 (two) times daily.  . hydroxychloroquine (PLAQUENIL) 200 MG tablet Take 200 mg by mouth 2 (two) times daily.   . Melatonin 3 MG CAPS Take 1 capsule by mouth at bedtime.  . predniSONE (DELTASONE) 20 MG tablet TAKE 1 TABLET BY MOUTH TWICE DAILY FOR 14 DAYS AND 1 ONCE DAILY FOR 7 DAYS AND 1/2 (ONE-HALF) ONCE DAILY FOR 7 DAYS   No facility-administered encounter medications on file as of 08/23/2019.     Review of Systems  Constitutional: Negative for chills and fever.  HENT: Positive for congestion. Negative for ear discharge, ear pain, postnasal drip, rhinorrhea, sinus pressure, sneezing and sore throat.   Eyes: Negative for pain, redness  and visual disturbance.  Respiratory: Positive for cough. Negative for chest tightness, shortness of breath and wheezing.   Cardiovascular: Negative for chest pain and leg swelling.  Musculoskeletal: Negative for back pain and gait problem.  Skin: Negative for rash.  Neurological: Negative for light-headedness and headaches.  Psychiatric/Behavioral: Negative for agitation and behavioral problems.  All other systems reviewed and are negative.   Observations/Objective: Patient sounds comfortable and in no acute distress.  Assessment and Plan: Problem List Items Addressed This Visit    None    Visit Diagnoses    COVID-19    -  Primary   Relevant Medications   benzonatate (TESSALON) 200 MG capsule       Follow Up Instructions:  Recommended mucinex and nasal saline.    I discussed the assessment and treatment plan with the patient. The patient was provided an opportunity to ask questions and all were answered. The patient agreed with the plan and demonstrated an understanding of the instructions.   The patient was advised to call back or seek an in-person evaluation if the symptoms worsen or if the condition fails to improve as anticipated.  The above assessment and management plan was discussed with the patient. The patient verbalized understanding of and has agreed to the management plan. Patient is aware to call the clinic if symptoms persist or worsen. Patient is aware when to return to the clinic for a follow-up visit. Patient educated on when it is appropriate to go to the emergency department.  I provided 10 minutes of non-face-to-face time during this encounter.    Worthy Rancher, MD

## 2019-09-13 ENCOUNTER — Other Ambulatory Visit: Payer: Self-pay | Admitting: Family Medicine

## 2019-09-14 ENCOUNTER — Telehealth: Payer: Self-pay | Admitting: Family Medicine

## 2019-09-14 NOTE — Telephone Encounter (Signed)
Patient aware and verbalized understanding. °

## 2019-09-14 NOTE — Telephone Encounter (Signed)
We do not routinely prescribe prednisone just because someone is COVID-19 positive. It is viral and we treat the symptoms, such as Mucinex for congestion, Tylenol/Ibuprofen for aches/pains/fever, inhalers for shortness of breath/wheezing, throat lozenges for sore throat, etc. It may be helpful to take vitamin C and zinc to boost immune system and help her get over this quicker.

## 2019-12-15 ENCOUNTER — Other Ambulatory Visit: Payer: Self-pay | Admitting: Primary Care

## 2019-12-15 DIAGNOSIS — R911 Solitary pulmonary nodule: Secondary | ICD-10-CM

## 2020-01-24 ENCOUNTER — Ambulatory Visit (HOSPITAL_COMMUNITY): Payer: Self-pay

## 2021-03-20 DIAGNOSIS — M25612 Stiffness of left shoulder, not elsewhere classified: Secondary | ICD-10-CM | POA: Diagnosis not present

## 2021-03-20 DIAGNOSIS — M7542 Impingement syndrome of left shoulder: Secondary | ICD-10-CM | POA: Diagnosis not present

## 2021-03-20 DIAGNOSIS — M6281 Muscle weakness (generalized): Secondary | ICD-10-CM | POA: Diagnosis not present

## 2021-03-22 DIAGNOSIS — M6281 Muscle weakness (generalized): Secondary | ICD-10-CM | POA: Diagnosis not present

## 2021-03-22 DIAGNOSIS — M25612 Stiffness of left shoulder, not elsewhere classified: Secondary | ICD-10-CM | POA: Diagnosis not present

## 2021-03-22 DIAGNOSIS — M7542 Impingement syndrome of left shoulder: Secondary | ICD-10-CM | POA: Diagnosis not present

## 2021-03-23 DIAGNOSIS — R5382 Chronic fatigue, unspecified: Secondary | ICD-10-CM | POA: Diagnosis not present

## 2021-03-23 DIAGNOSIS — M2550 Pain in unspecified joint: Secondary | ICD-10-CM | POA: Diagnosis not present

## 2021-03-23 DIAGNOSIS — M25512 Pain in left shoulder: Secondary | ICD-10-CM | POA: Diagnosis not present

## 2021-03-23 DIAGNOSIS — M329 Systemic lupus erythematosus, unspecified: Secondary | ICD-10-CM | POA: Diagnosis not present

## 2021-03-23 DIAGNOSIS — N1831 Chronic kidney disease, stage 3a: Secondary | ICD-10-CM | POA: Diagnosis not present

## 2021-03-28 IMAGING — CT CT ANGIOGRAPHY CHEST
2 of 6 series · 18 of 46 positions shown · IV contrast (Isovue)
Comparison: CT chest dated [DATE] 5151

CLINICAL DATA: PE suspected.  Positive D-dimer.

EXAM:
CT ANGIOGRAPHY CHEST WITH CONTRAST
TECHNIQUE: Multidetector CT imaging of the chest was performed using the
standard protocol during bolus administration of intravenous
contrast. Multiplanar CT image reconstructions and MIPs were
obtained to evaluate the vascular anatomy.
CONTRAST:  100mL OMNIPAQUE IOHEXOL 350 MG/ML SOLN

[Series 5: thins · axial · 0.71mm/px · z∈[-239,+39]mm · 15 of 306 slices shown]
[im 14/306  lung]
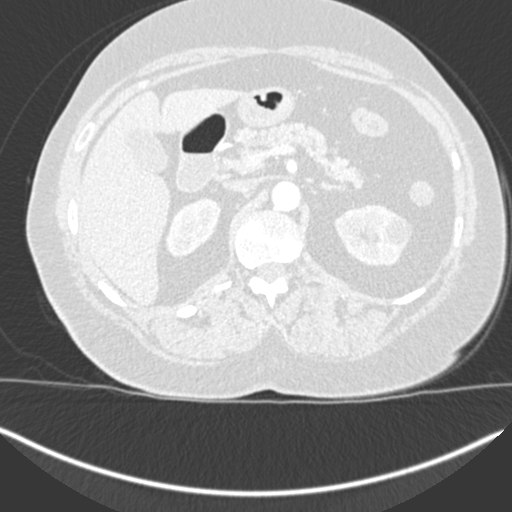
[im 40/306  soft-tissue]
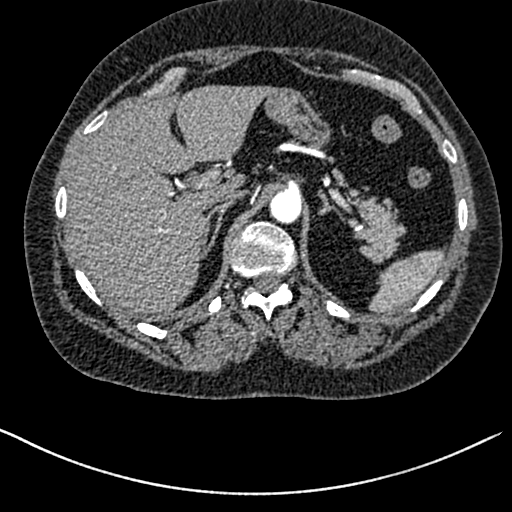
[im 54/306  lung]
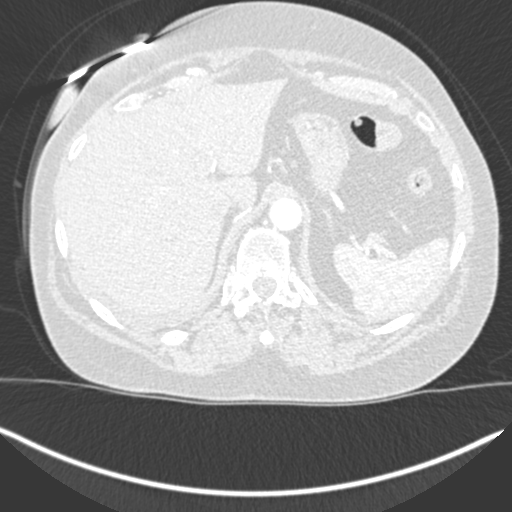
[im 80/306  soft-tissue]
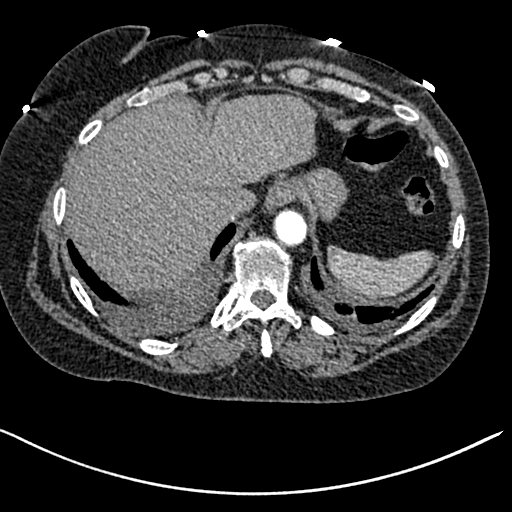
[im 93/306  lung]
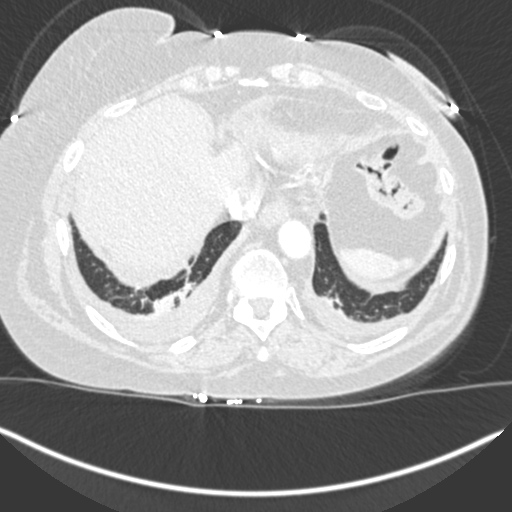
[im 120/306  soft-tissue]
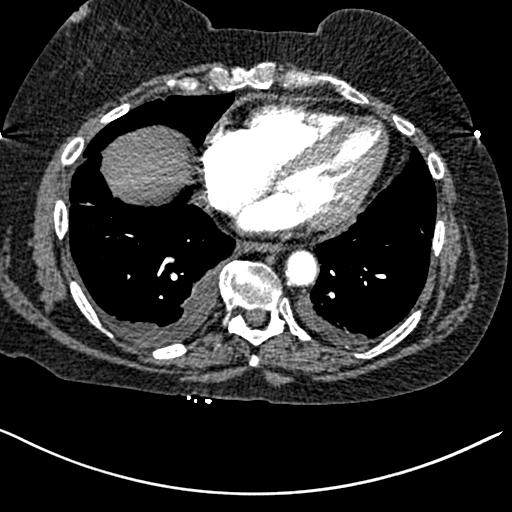
[im 133/306  lung]
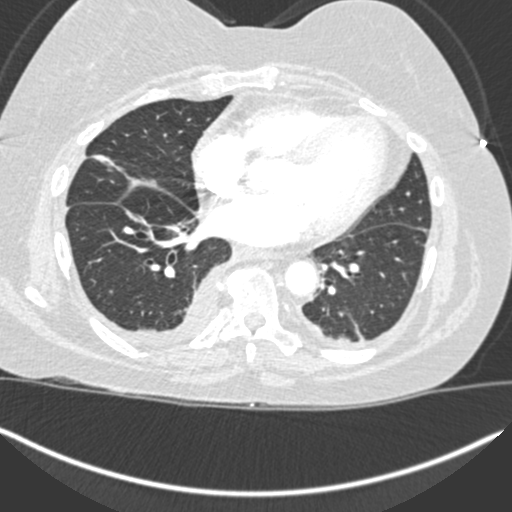
[im 160/306  soft-tissue]
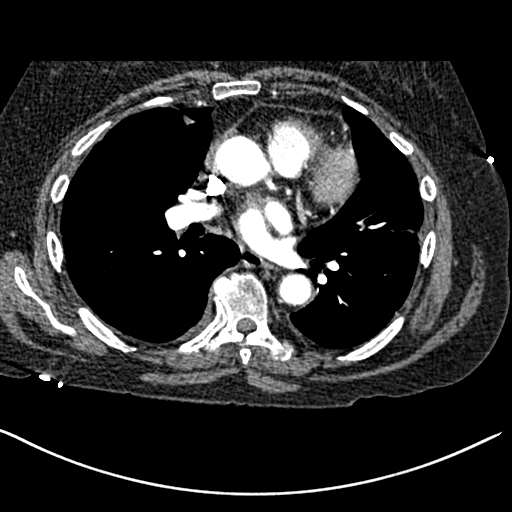
[im 173/306  lung]
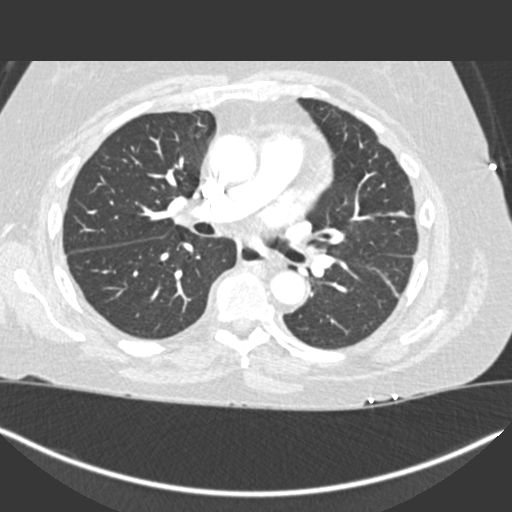
[im 186/306  soft-tissue]
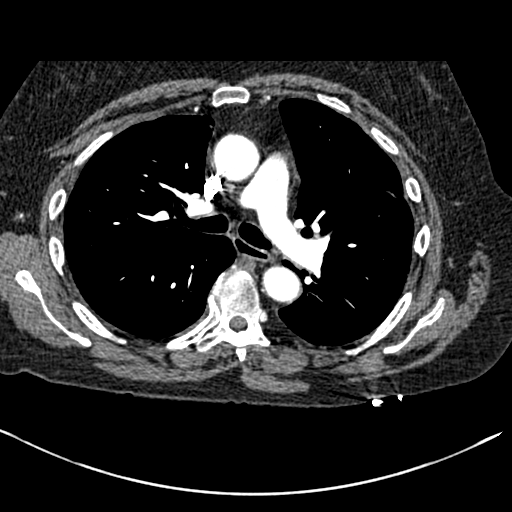
[im 213/306  lung]
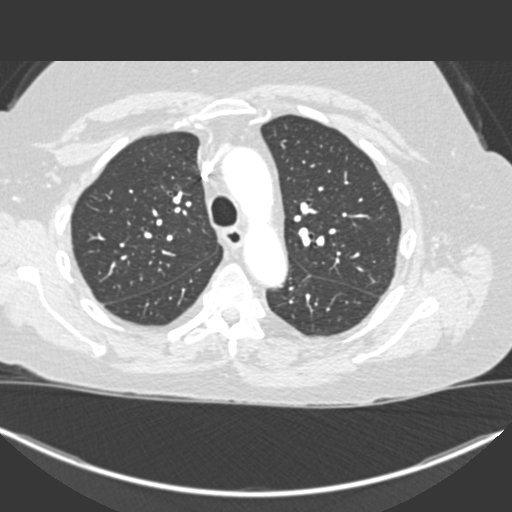
[im 226/306  soft-tissue]
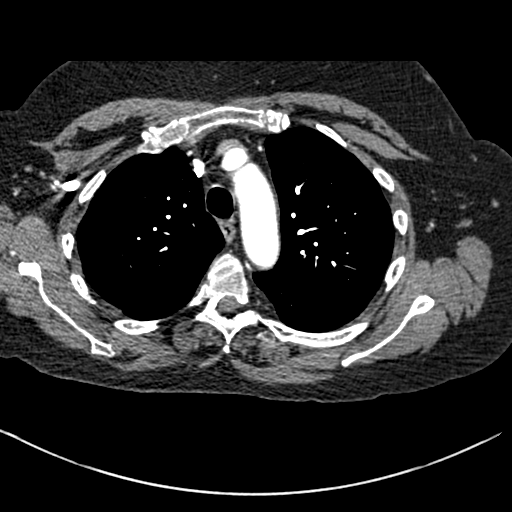
[im 252/306  lung]
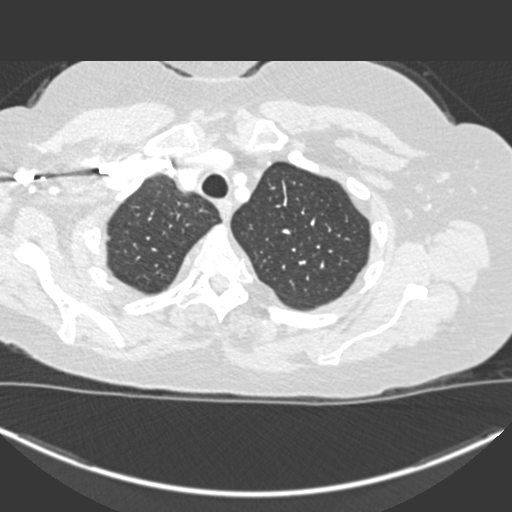
[im 266/306  soft-tissue]
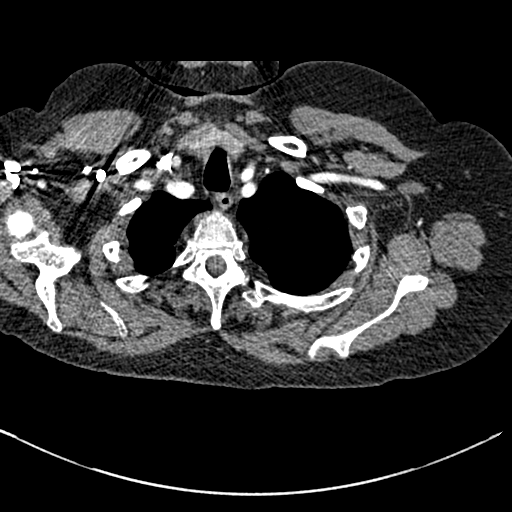
[im 292/306  lung]
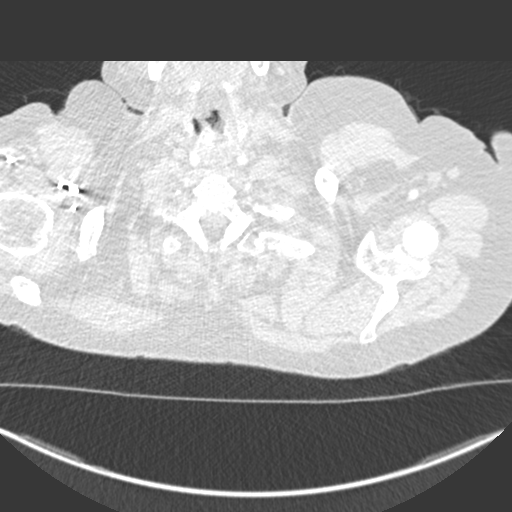

[Series 7: coronal mpr · coronal · 0.64mm/px · 3 of 151 slices shown]
[im 38/151  soft-tissue]
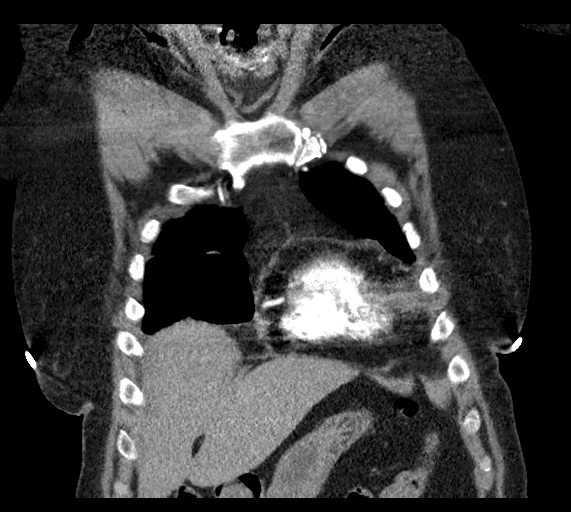
[im 76/151  soft-tissue]
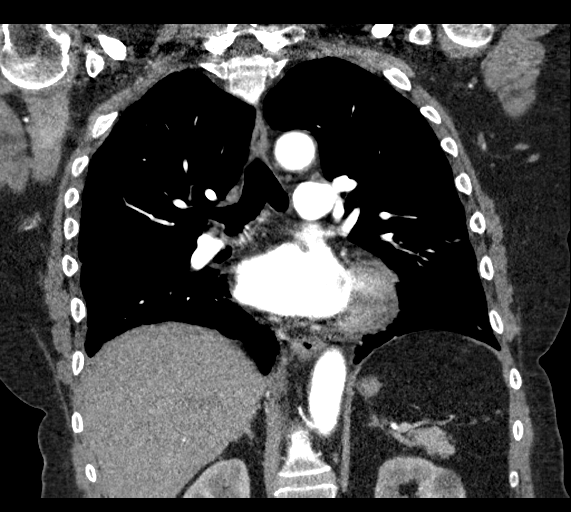
[im 113/151  soft-tissue]
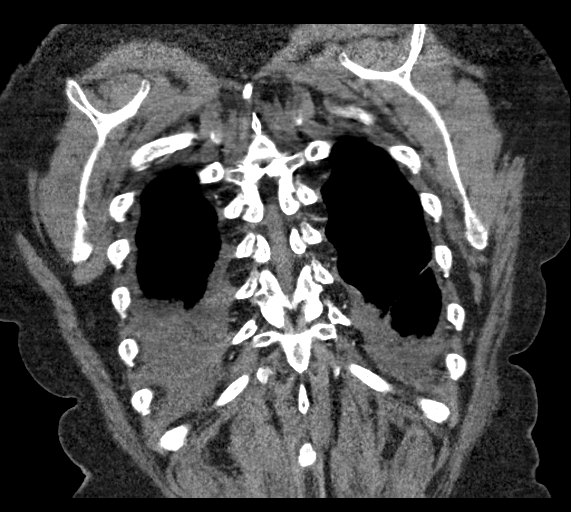

[18 of 46 positions shown; findings below may reference images not displayed]

FINDINGS: Cardiovascular: Heart is mildly enlarged. Atherosclerotic
calcifications are noted of the aorta and coronary arteries. No PE
identified. The pericardial effusion has significantly improved
since the prior study.

Mediastinum/Nodes: No enlarged mediastinal, hilar, or axillary lymph
nodes. Thyroid gland, trachea, and esophagus demonstrate no
significant findings. There are few mildly prominent right
supraclavicular lymph nodes. No significant axillary adenopathy.

Lungs/Pleura: There are trace to small bilateral pleural effusions,
slightly improved from prior study. There is slight increased
density within the medial aspect of the right pleural effusion which
is favored to be artifactual. There is a 7 mm pulmonary nodule in
the right upper lobe (axial series 6, image 50). There is scarring
versus atelectasis in the right middle lobe. Atelectasis is noted at
the right lower lobe. There is some linear scarring versus
atelectasis in the lingula. Atelectasis is noted at the left lung
base. The trachea is unremarkable.

Upper Abdomen: No acute abnormality.

Musculoskeletal: No chest wall abnormality. No acute or significant
osseous findings.

Review of the MIP images confirms the above findings.
IMPRESSION: 1. No PE identified.
2. Persistent small bilateral pleural effusions, right greater than
left. These have improved somewhat since the prior study.
3. Significant interval improvement in the previously demonstrated
pericardial effusion.
4. Small amount of atelectasis at the lung bases bilaterally.
5. Stable mildly enlarged right supraclavicular lymph nodes.
6. Stable 7 mm pulmonary nodule in the right upper lobe. Repeat CT
is recommended in 12-18 months to confirm stability of this finding
given its persistence across studies dating back to [DATE]. This
recommendation follows the consensus statement: Guidelines for
Management of Incidental Pulmonary Nodules Detected on CT Images:

## 2021-04-03 DIAGNOSIS — M25612 Stiffness of left shoulder, not elsewhere classified: Secondary | ICD-10-CM | POA: Diagnosis not present

## 2021-04-03 DIAGNOSIS — M6281 Muscle weakness (generalized): Secondary | ICD-10-CM | POA: Diagnosis not present

## 2021-04-03 DIAGNOSIS — M7542 Impingement syndrome of left shoulder: Secondary | ICD-10-CM | POA: Diagnosis not present

## 2021-04-11 DIAGNOSIS — M7502 Adhesive capsulitis of left shoulder: Secondary | ICD-10-CM | POA: Diagnosis not present

## 2021-04-11 DIAGNOSIS — M7542 Impingement syndrome of left shoulder: Secondary | ICD-10-CM | POA: Diagnosis not present

## 2021-04-30 DIAGNOSIS — Z79899 Other long term (current) drug therapy: Secondary | ICD-10-CM | POA: Diagnosis not present

## 2021-04-30 DIAGNOSIS — W19XXXA Unspecified fall, initial encounter: Secondary | ICD-10-CM | POA: Diagnosis not present

## 2021-04-30 DIAGNOSIS — Z7982 Long term (current) use of aspirin: Secondary | ICD-10-CM | POA: Diagnosis not present

## 2021-04-30 DIAGNOSIS — R5381 Other malaise: Secondary | ICD-10-CM | POA: Diagnosis not present

## 2021-04-30 DIAGNOSIS — R404 Transient alteration of awareness: Secondary | ICD-10-CM | POA: Diagnosis not present

## 2021-04-30 DIAGNOSIS — Z20822 Contact with and (suspected) exposure to covid-19: Secondary | ICD-10-CM | POA: Diagnosis not present

## 2021-04-30 DIAGNOSIS — R7303 Prediabetes: Secondary | ICD-10-CM | POA: Diagnosis not present

## 2021-04-30 DIAGNOSIS — I639 Cerebral infarction, unspecified: Secondary | ICD-10-CM | POA: Diagnosis not present

## 2021-04-30 DIAGNOSIS — R42 Dizziness and giddiness: Secondary | ICD-10-CM | POA: Diagnosis not present

## 2021-04-30 DIAGNOSIS — I517 Cardiomegaly: Secondary | ICD-10-CM | POA: Diagnosis not present

## 2021-04-30 DIAGNOSIS — I129 Hypertensive chronic kidney disease with stage 1 through stage 4 chronic kidney disease, or unspecified chronic kidney disease: Secondary | ICD-10-CM | POA: Diagnosis not present

## 2021-04-30 DIAGNOSIS — R2681 Unsteadiness on feet: Secondary | ICD-10-CM | POA: Diagnosis not present

## 2021-04-30 DIAGNOSIS — I340 Nonrheumatic mitral (valve) insufficiency: Secondary | ICD-10-CM | POA: Diagnosis not present

## 2021-04-30 DIAGNOSIS — I6523 Occlusion and stenosis of bilateral carotid arteries: Secondary | ICD-10-CM | POA: Diagnosis not present

## 2021-04-30 DIAGNOSIS — N183 Chronic kidney disease, stage 3 unspecified: Secondary | ICD-10-CM | POA: Diagnosis not present

## 2021-04-30 DIAGNOSIS — Z743 Need for continuous supervision: Secondary | ICD-10-CM | POA: Diagnosis not present

## 2021-04-30 DIAGNOSIS — M329 Systemic lupus erythematosus, unspecified: Secondary | ICD-10-CM | POA: Diagnosis not present

## 2021-04-30 DIAGNOSIS — R29700 NIHSS score 0: Secondary | ICD-10-CM | POA: Diagnosis not present

## 2021-04-30 DIAGNOSIS — I63542 Cerebral infarction due to unspecified occlusion or stenosis of left cerebellar artery: Secondary | ICD-10-CM | POA: Diagnosis not present

## 2021-04-30 DIAGNOSIS — R5382 Chronic fatigue, unspecified: Secondary | ICD-10-CM | POA: Diagnosis not present

## 2021-04-30 DIAGNOSIS — I361 Nonrheumatic tricuspid (valve) insufficiency: Secondary | ICD-10-CM | POA: Diagnosis not present

## 2021-04-30 DIAGNOSIS — Z7902 Long term (current) use of antithrombotics/antiplatelets: Secondary | ICD-10-CM | POA: Diagnosis not present

## 2021-05-11 ENCOUNTER — Telehealth: Payer: Self-pay

## 2021-05-11 NOTE — Telephone Encounter (Signed)
Transition Care Management Unsuccessful Follow-up Telephone Call  Date of discharge and from where:  [facility] 05/09/21  Diagnosis:  CVA   Attempts:  1st Attempt  Reason for unsuccessful TCM follow-up call:  Missing or invalid number rings once and disconnects

## 2021-05-14 DIAGNOSIS — N183 Chronic kidney disease, stage 3 unspecified: Secondary | ICD-10-CM | POA: Diagnosis not present

## 2021-05-14 DIAGNOSIS — I129 Hypertensive chronic kidney disease with stage 1 through stage 4 chronic kidney disease, or unspecified chronic kidney disease: Secondary | ICD-10-CM | POA: Diagnosis not present

## 2021-05-14 DIAGNOSIS — R5382 Chronic fatigue, unspecified: Secondary | ICD-10-CM | POA: Diagnosis not present

## 2021-05-14 DIAGNOSIS — I69354 Hemiplegia and hemiparesis following cerebral infarction affecting left non-dominant side: Secondary | ICD-10-CM | POA: Diagnosis not present

## 2021-05-14 DIAGNOSIS — M329 Systemic lupus erythematosus, unspecified: Secondary | ICD-10-CM | POA: Diagnosis not present

## 2021-05-14 DIAGNOSIS — M1990 Unspecified osteoarthritis, unspecified site: Secondary | ICD-10-CM | POA: Diagnosis not present

## 2021-05-14 DIAGNOSIS — Z7902 Long term (current) use of antithrombotics/antiplatelets: Secondary | ICD-10-CM | POA: Diagnosis not present

## 2021-05-15 ENCOUNTER — Other Ambulatory Visit: Payer: Self-pay

## 2021-05-15 ENCOUNTER — Encounter: Payer: Self-pay | Admitting: Nurse Practitioner

## 2021-05-15 ENCOUNTER — Ambulatory Visit (INDEPENDENT_AMBULATORY_CARE_PROVIDER_SITE_OTHER): Payer: Medicare Other | Admitting: Nurse Practitioner

## 2021-05-15 VITALS — BP 124/77 | HR 101 | Temp 97.6°F | Ht 66.0 in | Wt 197.0 lb

## 2021-05-15 DIAGNOSIS — N183 Chronic kidney disease, stage 3 unspecified: Secondary | ICD-10-CM | POA: Diagnosis not present

## 2021-05-15 DIAGNOSIS — I129 Hypertensive chronic kidney disease with stage 1 through stage 4 chronic kidney disease, or unspecified chronic kidney disease: Secondary | ICD-10-CM | POA: Diagnosis not present

## 2021-05-15 DIAGNOSIS — M1990 Unspecified osteoarthritis, unspecified site: Secondary | ICD-10-CM | POA: Diagnosis not present

## 2021-05-15 DIAGNOSIS — Z8673 Personal history of transient ischemic attack (TIA), and cerebral infarction without residual deficits: Secondary | ICD-10-CM | POA: Diagnosis not present

## 2021-05-15 DIAGNOSIS — R630 Anorexia: Secondary | ICD-10-CM | POA: Insufficient documentation

## 2021-05-15 DIAGNOSIS — I69354 Hemiplegia and hemiparesis following cerebral infarction affecting left non-dominant side: Secondary | ICD-10-CM | POA: Diagnosis not present

## 2021-05-15 DIAGNOSIS — Z09 Encounter for follow-up examination after completed treatment for conditions other than malignant neoplasm: Secondary | ICD-10-CM | POA: Insufficient documentation

## 2021-05-15 DIAGNOSIS — R5382 Chronic fatigue, unspecified: Secondary | ICD-10-CM | POA: Diagnosis not present

## 2021-05-15 DIAGNOSIS — M329 Systemic lupus erythematosus, unspecified: Secondary | ICD-10-CM | POA: Diagnosis not present

## 2021-05-15 DIAGNOSIS — Z7902 Long term (current) use of antithrombotics/antiplatelets: Secondary | ICD-10-CM | POA: Diagnosis not present

## 2021-05-15 MED ORDER — MEGESTROL ACETATE 400 MG/10ML PO SUSP: 400.0000 mg | Freq: Every day | ORAL | 1 refills | Status: DC

## 2021-05-15 NOTE — Assessment & Plan Note (Addendum)
Patient is gradually returning back to normal , she reports slight weakness today, patient tired and have no appetite.  Completed neuro exam patient is within normal limits.  Megace ordered for few weeks to help boost patient's appetite. Adjustable single point caine to help with stablity when walking. Patient  to follow-up with worsening unresolved symptoms.  Education provided to patient: Stroke prevention.  Rx sent to pharmacy.

## 2021-05-15 NOTE — Patient Instructions (Signed)
Stroke Prevention Some medical conditions and behaviors are associated with a higher chance of having a stroke. You can help prevent a stroke by making nutrition, lifestyle, and other changes, including managing any medical conditions you may have. What nutrition changes can be made?  Eat healthy foods. You can do this by: Choosing foods high in fiber, such as fresh fruits and vegetables and whole grains. Eating at least 5 or more servings of fruits and vegetables a day. Try to fill half of your plate at each meal with fruits and vegetables. Choosing lean protein foods, such as lean cuts of meat, poultry without skin, fish, tofu, beans, and nuts. Eating low-fat dairy products. Avoiding foods that are high in salt (sodium). This can help lower blood pressure. Avoiding foods that have saturated fat, trans fat, and cholesterol. This can help prevent high cholesterol. Avoiding processed and premade foods. Follow your health care provider's specific guidelines for losing weight, controlling high blood pressure (hypertension), lowering high cholesterol, and managing diabetes. These may include: Reducing your daily calorie intake. Limiting your daily sodium intake to 1,500 milligrams (mg). Using only healthy fats for cooking, such as olive oil, canola oil, or sunflower oil. Counting your daily carbohydrate intake. What lifestyle changes can be made? Maintain a healthy weight. Talk to your health care provider about your ideal weight. Get at least 30 minutes of moderate physical activity at least 5 days a week. Moderate activity includes brisk walking, biking, and swimming. Do not use any products that contain nicotine or tobacco, such as cigarettes and e-cigarettes. If you need help quitting, ask your health care provider. It may also be helpful to avoid exposure to secondhand smoke. Limit alcohol intake to no more than 1 drink a day for nonpregnant women and 2 drinks a day for men. One drink equals 12  oz of beer, 5 oz of wine, or 1 oz of hard liquor. Stop any illegal drug use. Avoid taking birth control pills. Talk to your health care provider about the risks of taking birth control pills if: You are over 35 years old. You smoke. You get migraines. You have ever had a blood clot. What other changes can be made? Manage your cholesterol levels. Eating a healthy diet is important for preventing high cholesterol. If cholesterol cannot be managed through diet alone, you may also need to take medicines. Take any prescribed medicines to control your cholesterol as told by your health care provider. Manage your diabetes. Eating a healthy diet and exercising regularly are important parts of managing your blood sugar. If your blood sugar cannot be managed through diet and exercise, you may need to take medicines. Take any prescribed medicines to control your diabetes as told by your health care provider. Control your hypertension. To reduce your risk of stroke, try to keep your blood pressure below 130/80. Eating a healthy diet and exercising regularly are an important part of controlling your blood pressure. If your blood pressure cannot be managed through diet and exercise, you may need to take medicines. Take any prescribed medicines to control hypertension as told by your health care provider. Ask your health care provider if you should monitor your blood pressure at home. Have your blood pressure checked every year, even if your blood pressure is normal. Blood pressure increases with age and some medical conditions. Get evaluated for sleep disorders (sleep apnea). Talk to your health care provider about getting a sleep evaluation if you snore a lot or have excessive sleepiness. Take over-the-counter   and prescription medicines only as told by your health care provider. Aspirin or blood thinners (antiplatelets or anticoagulants) may be recommended to reduce your risk of forming blood clots that can  lead to stroke. Make sure that any other medical conditions you have, such as atrial fibrillation or atherosclerosis, are managed. What are the warning signs of a stroke? The warning signs of a stroke can be easily remembered as BEFAST. B is for balance. Signs include: Dizziness. Loss of balance or coordination. Sudden trouble walking. E is for eyes. Signs include: A sudden change in vision. Trouble seeing. F is for face. Signs include: Sudden weakness or numbness of the face. The face or eyelid drooping to one side. A is for arms. Signs include: Sudden weakness or numbness of the arm, usually on one side of the body. S is for speech. Signs include: Trouble speaking (aphasia). Trouble understanding. T is for time. These symptoms may represent a serious problem that is an emergency. Do not wait to see if the symptoms will go away. Get medical help right away. Call your local emergency services (911 in the [place] Do not drive yourself to the hospital. Other signs of stroke may include: A sudden, severe headache with no known cause. Nausea or vomiting. Seizure. Where to find more information For more information, visit: American Stroke Association: www.strokeassociation.org National Stroke Association: www.stroke.org Summary You can prevent a stroke by eating healthy, exercising, not smoking, limiting alcohol intake, and managing any medical conditions you may have. Do not use any products that contain nicotine or tobacco, such as cigarettes and e-cigarettes. If you need help quitting, ask your health care provider. It may also be helpful to avoid exposure to secondhand smoke. Remember BEFAST for warning signs of stroke. Get help right away if you or a loved one has any of these signs. This information is not intended to replace advice given to you by your health care provider. Make sure you discuss any questions you have with your health care provider. Document Revised: 08/15/2017  Document Reviewed: 10/08/2016 Elsevier Patient Education  2021 Elsevier Inc.  

## 2021-05-15 NOTE — Progress Notes (Addendum)
Established Patient Office Visit  Subjective:  Patient ID: Rebekah [name], female    DOB: [birth year]  Age: 65 y.o. MRN: [id number]  CC:  Chief Complaint  Patient presents with  . Hospitalization Follow-up    HPI [name] presents for Today's visit was for Transitional Care Management.  The patient was discharged from [facility] on 05/09/2021 with a primary diagnosis of CVA.   Contact with the patient and/or caregiver, by a clinical staff member, was made on 05/11/2021 and was documented as a telephone encounter within the EMR.  Through chart review and discussion with the patient I have determined that management of their condition is of moderate complexity.   Patient is a 65 year old female who is following up after hospital admission for mild stroke.  She was admitted to [facility] 04/30/2021 due to cerebrovascular accident.  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  Patient was stabilized and started on Plavix, baby aspirin and high statin.  Following discharge patient is working with physical therapy, and assessment for rehab.    Past Medical History:  Diagnosis Date  . Arthritis    Rheumatoid  . Hypertension     Past Surgical History:  Procedure Laterality Date  . CESAREAN SECTION      Family History  Problem Relation Age of Onset  . Diabetes Mother   . Hypertension Mother   . Cancer Mother   . Stroke Mother   . Cancer Father        Lung  . Hypertension Sister   . Hypertension Brother   . Hypertension Sister   . Hypertension Sister   . Hypertension Sister   . Hypertension Brother   . Hypertension Sister   . Hypertension Sister     Social History   Socioeconomic History  . Marital status: Single    Spouse name: Not on file  . Number of children: Not on file  . Years of education: Not on file  . Highest education level: Not on file   Occupational History  . Not on file  Tobacco Use  . Smoking status: Never  . Smokeless tobacco: Never  Vaping Use  . Vaping Use: Never used  Substance and Sexual Activity  . Alcohol use: No    Alcohol/week: 0.0 standard drinks  . Drug use: No  . Sexual activity: Not on file  Other Topics Concern  . Not on file  Social History Narrative   Lives with sister.     Social Determinants of Health   Financial Resource Strain: Not on file  Food Insecurity: Not on file  Transportation Needs: Not on file  Physical Activity: Not on file  Stress: Not on file  Social Connections: Not on file  Intimate Partner Violence: Not on file    Outpatient Medications Prior to Visit  Medication Sig Dispense Refill  . aspirin 81 MG EC tablet Take 1 tablet by mouth daily.    Marland Kitchen atorvastatin (LIPITOR) 40 MG tablet Take 1 tablet by mouth daily.    Marland Kitchen amLODipine (NORVASC) 5 MG tablet Take 5 mg by mouth daily.    Marland Kitchen atenolol (TENORMIN) 25 MG tablet Take 25 mg by mouth daily.    . benzonatate (TESSALON) 200 MG capsule Take 1 capsule (200 mg total) by mouth 2 (two) times daily as needed for cough. 20 capsule 0  . predniSONE (DELTASONE) 20 MG tablet TAKE 1 TABLET BY MOUTH  TWICE DAILY FOR 14 DAYS AND 1 ONCE DAILY FOR 7 DAYS AND 1/2 (ONE-HALF) ONCE DAILY FOR 7 DAYS 39 tablet 0   No facility-administered medications prior to visit.    No Known Allergies  ROS Review of Systems    Objective:    Physical Exam Vitals reviewed.  HENT:     Head: Normocephalic.     Right Ear: Ear canal and external ear normal.     Nose: Nose normal.     Mouth/Throat:     Mouth: Mucous membranes are moist.  Eyes:     Conjunctiva/sclera: Conjunctivae normal.  Cardiovascular:     Rate and Rhythm: Normal rate and regular rhythm.     Pulses: Normal pulses.     Heart sounds: Normal heart sounds.  Pulmonary:     Effort: Pulmonary effort is normal.     Breath sounds: Normal breath sounds.  Abdominal:     General: Bowel  sounds are normal.  Skin:    Findings: No rash.  Neurological:     Mental Status: She is alert and oriented to person, place, and time.     Cranial Nerves: Cranial nerves are intact.     Motor: Weakness present.     Gait: Gait abnormal.  Psychiatric:        Behavior: Behavior normal.    BP 124/77   Pulse (!) 101   Temp 97.6 F (36.4 C) (Temporal)   Ht 5\' 6"  (1.676 m)   Wt 197 lb (89.4 kg)   SpO2 97%   BMI 31.80 kg/m  Wt Readings from Last 3 Encounters:  05/15/21 197 lb (89.4 kg)  01/26/19 214 lb (97.1 kg)  01/20/19 214 lb 12.8 oz (97.4 kg)     Health Maintenance Due  Topic Date Due  . COVID-19 Vaccine (1) Never done  . Hepatitis C Screening  Never done  . TETANUS/TDAP  Never done  . PAP SMEAR-Modifier  Never done  . COLONOSCOPY (Pts 45-71yrs Insurance coverage will need to be confirmed)  Never done  . MAMMOGRAM  Never done  . Zoster Vaccines- Shingrix (1 of 2) Never done  . DEXA SCAN  Never done  . INFLUENZA VACCINE  Never done    There are no preventive care reminders to display for this patient.  Lab Results  Component Value Date   TSH 2.916 10/05/2018   Lab Results  Component Value Date   WBC 7.7 01/20/2019   HGB 11.9 (L) 01/20/2019   HCT 36.4 01/20/2019   MCV 87.2 01/20/2019   PLT 405.0 (H) 01/20/2019   Lab Results  Component Value Date   NA 139 01/20/2019   K 3.6 01/20/2019   CO2 25 01/20/2019   GLUCOSE 95 01/20/2019   BUN 15 01/20/2019   CREATININE 1.34 (H) 01/20/2019   BILITOT 0.7 10/04/2018   ALKPHOS 62 10/04/2018   AST 42 (H) 10/04/2018   ALT 54 (H) 10/04/2018   PROT 6.6 10/04/2018   ALBUMIN 2.2 (L) 10/04/2018   CALCIUM 9.1 01/20/2019   ANIONGAP 9 10/05/2018   GFR 48.28 (L) 01/20/2019       Assessment & Plan:   Problem List Items Addressed This Visit       Other   Decreased appetite - Primary    Megace for appetite stimulant, Rx sent to pharmacy.      Relevant Medications   megestrol (MEGACE) 400 MG/10ML suspension    Hospital discharge follow-up    Patient is gradually returning back to normal ,  she reports slight weakness today, patient tired and have no appetite.  Completed neuro exam patient is within normal limits.  Megace ordered for few weeks to help boost patient's appetite. Adjustable single point caine to help with stablity when walking. Patient  to follow-up with worsening unresolved symptoms.  Education provided to patient: Stroke prevention.  Rx sent to pharmacy.        History of stroke    Meds ordered this encounter  Medications  . megestrol (MEGACE) 400 MG/10ML suspension    Sig: Take 10 mLs (400 mg total) by mouth daily.    Dispense:  240 mL    Refill:  1    Order Specific Question:   Supervising Provider    Answer:   Janora Norlander [[id number]]    Follow-up: Return if symptoms worsen or fail to improve.    Ivy Lynn, NP

## 2021-05-15 NOTE — Assessment & Plan Note (Signed)
Megace for appetite stimulant, Rx sent to pharmacy.

## 2021-05-16 NOTE — Telephone Encounter (Signed)
Noticed patient was seen 05/15/2021 for hospital f/u. I had been unable to reach her for the TCM call

## 2021-05-17 DIAGNOSIS — I69354 Hemiplegia and hemiparesis following cerebral infarction affecting left non-dominant side: Secondary | ICD-10-CM | POA: Diagnosis not present

## 2021-05-17 DIAGNOSIS — I129 Hypertensive chronic kidney disease with stage 1 through stage 4 chronic kidney disease, or unspecified chronic kidney disease: Secondary | ICD-10-CM | POA: Diagnosis not present

## 2021-05-17 DIAGNOSIS — N183 Chronic kidney disease, stage 3 unspecified: Secondary | ICD-10-CM | POA: Diagnosis not present

## 2021-05-17 DIAGNOSIS — Z7902 Long term (current) use of antithrombotics/antiplatelets: Secondary | ICD-10-CM | POA: Diagnosis not present

## 2021-05-17 DIAGNOSIS — M329 Systemic lupus erythematosus, unspecified: Secondary | ICD-10-CM | POA: Diagnosis not present

## 2021-05-17 DIAGNOSIS — M1990 Unspecified osteoarthritis, unspecified site: Secondary | ICD-10-CM | POA: Diagnosis not present

## 2021-05-17 DIAGNOSIS — R5382 Chronic fatigue, unspecified: Secondary | ICD-10-CM | POA: Diagnosis not present

## 2021-05-22 ENCOUNTER — Other Ambulatory Visit: Payer: Self-pay | Admitting: Family Medicine

## 2021-05-22 ENCOUNTER — Telehealth: Payer: Self-pay | Admitting: Family Medicine

## 2021-05-22 DIAGNOSIS — I69354 Hemiplegia and hemiparesis following cerebral infarction affecting left non-dominant side: Secondary | ICD-10-CM | POA: Diagnosis not present

## 2021-05-22 DIAGNOSIS — M329 Systemic lupus erythematosus, unspecified: Secondary | ICD-10-CM | POA: Diagnosis not present

## 2021-05-22 DIAGNOSIS — Z7902 Long term (current) use of antithrombotics/antiplatelets: Secondary | ICD-10-CM | POA: Diagnosis not present

## 2021-05-22 DIAGNOSIS — I129 Hypertensive chronic kidney disease with stage 1 through stage 4 chronic kidney disease, or unspecified chronic kidney disease: Secondary | ICD-10-CM | POA: Diagnosis not present

## 2021-05-22 DIAGNOSIS — N183 Chronic kidney disease, stage 3 unspecified: Secondary | ICD-10-CM | POA: Diagnosis not present

## 2021-05-22 DIAGNOSIS — R5382 Chronic fatigue, unspecified: Secondary | ICD-10-CM | POA: Diagnosis not present

## 2021-05-22 DIAGNOSIS — M1990 Unspecified osteoarthritis, unspecified site: Secondary | ICD-10-CM | POA: Diagnosis not present

## 2021-05-22 MED ORDER — CLOPIDOGREL BISULFATE 75 MG PO TABS
75.0000 mg | ORAL_TABLET | Freq: Every day | ORAL | 3 refills | Status: DC
Start: 2021-05-22 — End: 2022-01-09

## 2021-05-22 NOTE — Telephone Encounter (Signed)
Will send to PCP to advise

## 2021-05-22 NOTE — Telephone Encounter (Signed)
I sent in the scrip. Needs to take ASA 81 mg daily as well

## 2021-05-23 NOTE — Telephone Encounter (Signed)
Number not working

## 2021-05-24 DIAGNOSIS — M1990 Unspecified osteoarthritis, unspecified site: Secondary | ICD-10-CM | POA: Diagnosis not present

## 2021-05-24 DIAGNOSIS — M329 Systemic lupus erythematosus, unspecified: Secondary | ICD-10-CM | POA: Diagnosis not present

## 2021-05-24 DIAGNOSIS — Z7902 Long term (current) use of antithrombotics/antiplatelets: Secondary | ICD-10-CM | POA: Diagnosis not present

## 2021-05-24 DIAGNOSIS — R5382 Chronic fatigue, unspecified: Secondary | ICD-10-CM | POA: Diagnosis not present

## 2021-05-24 DIAGNOSIS — N183 Chronic kidney disease, stage 3 unspecified: Secondary | ICD-10-CM | POA: Diagnosis not present

## 2021-05-24 DIAGNOSIS — I129 Hypertensive chronic kidney disease with stage 1 through stage 4 chronic kidney disease, or unspecified chronic kidney disease: Secondary | ICD-10-CM | POA: Diagnosis not present

## 2021-05-24 DIAGNOSIS — I69354 Hemiplegia and hemiparesis following cerebral infarction affecting left non-dominant side: Secondary | ICD-10-CM | POA: Diagnosis not present

## 2021-05-25 ENCOUNTER — Telehealth: Payer: Self-pay | Admitting: Family Medicine

## 2021-05-25 NOTE — Telephone Encounter (Signed)
Pt wants to talk to a nurse about the medications that she was prescribed in the hospital to see if she needs to continue her normal meds. Please call back and advise

## 2021-05-25 NOTE — Telephone Encounter (Signed)
Tried calling pt. Number cannot be completed.  Pt did have Hospital follow up with Je. Only medication change is that Je ordered Megace to increase appetite.

## 2021-05-29 DIAGNOSIS — M1990 Unspecified osteoarthritis, unspecified site: Secondary | ICD-10-CM | POA: Diagnosis not present

## 2021-05-29 DIAGNOSIS — I69354 Hemiplegia and hemiparesis following cerebral infarction affecting left non-dominant side: Secondary | ICD-10-CM | POA: Diagnosis not present

## 2021-05-29 DIAGNOSIS — R5382 Chronic fatigue, unspecified: Secondary | ICD-10-CM | POA: Diagnosis not present

## 2021-05-29 DIAGNOSIS — N183 Chronic kidney disease, stage 3 unspecified: Secondary | ICD-10-CM | POA: Diagnosis not present

## 2021-05-29 DIAGNOSIS — I129 Hypertensive chronic kidney disease with stage 1 through stage 4 chronic kidney disease, or unspecified chronic kidney disease: Secondary | ICD-10-CM | POA: Diagnosis not present

## 2021-05-29 DIAGNOSIS — Z7902 Long term (current) use of antithrombotics/antiplatelets: Secondary | ICD-10-CM | POA: Diagnosis not present

## 2021-05-29 DIAGNOSIS — M329 Systemic lupus erythematosus, unspecified: Secondary | ICD-10-CM | POA: Diagnosis not present

## 2021-05-30 ENCOUNTER — Telehealth: Payer: Self-pay | Admitting: Family Medicine

## 2021-05-30 DIAGNOSIS — M329 Systemic lupus erythematosus, unspecified: Secondary | ICD-10-CM | POA: Diagnosis not present

## 2021-05-30 DIAGNOSIS — N183 Chronic kidney disease, stage 3 unspecified: Secondary | ICD-10-CM | POA: Diagnosis not present

## 2021-05-30 DIAGNOSIS — I129 Hypertensive chronic kidney disease with stage 1 through stage 4 chronic kidney disease, or unspecified chronic kidney disease: Secondary | ICD-10-CM | POA: Diagnosis not present

## 2021-05-30 DIAGNOSIS — M1990 Unspecified osteoarthritis, unspecified site: Secondary | ICD-10-CM | POA: Diagnosis not present

## 2021-05-30 DIAGNOSIS — R5382 Chronic fatigue, unspecified: Secondary | ICD-10-CM | POA: Diagnosis not present

## 2021-05-30 DIAGNOSIS — I69354 Hemiplegia and hemiparesis following cerebral infarction affecting left non-dominant side: Secondary | ICD-10-CM | POA: Diagnosis not present

## 2021-05-30 DIAGNOSIS — Z7902 Long term (current) use of antithrombotics/antiplatelets: Secondary | ICD-10-CM | POA: Diagnosis not present

## 2021-05-30 NOTE — Telephone Encounter (Signed)
Physical therapy is requesting a rx for an adjustable single point cane

## 2021-05-31 DIAGNOSIS — I69354 Hemiplegia and hemiparesis following cerebral infarction affecting left non-dominant side: Secondary | ICD-10-CM | POA: Diagnosis not present

## 2021-05-31 DIAGNOSIS — M329 Systemic lupus erythematosus, unspecified: Secondary | ICD-10-CM | POA: Diagnosis not present

## 2021-05-31 DIAGNOSIS — M1990 Unspecified osteoarthritis, unspecified site: Secondary | ICD-10-CM | POA: Diagnosis not present

## 2021-05-31 DIAGNOSIS — I129 Hypertensive chronic kidney disease with stage 1 through stage 4 chronic kidney disease, or unspecified chronic kidney disease: Secondary | ICD-10-CM | POA: Diagnosis not present

## 2021-05-31 DIAGNOSIS — Z7902 Long term (current) use of antithrombotics/antiplatelets: Secondary | ICD-10-CM | POA: Diagnosis not present

## 2021-05-31 DIAGNOSIS — N183 Chronic kidney disease, stage 3 unspecified: Secondary | ICD-10-CM | POA: Diagnosis not present

## 2021-05-31 DIAGNOSIS — R5382 Chronic fatigue, unspecified: Secondary | ICD-10-CM | POA: Diagnosis not present

## 2021-05-31 NOTE — Telephone Encounter (Signed)
NUMBER NOT WORKING

## 2021-06-05 DIAGNOSIS — M329 Systemic lupus erythematosus, unspecified: Secondary | ICD-10-CM | POA: Diagnosis not present

## 2021-06-05 DIAGNOSIS — R5382 Chronic fatigue, unspecified: Secondary | ICD-10-CM | POA: Diagnosis not present

## 2021-06-05 DIAGNOSIS — N183 Chronic kidney disease, stage 3 unspecified: Secondary | ICD-10-CM | POA: Diagnosis not present

## 2021-06-05 DIAGNOSIS — M1990 Unspecified osteoarthritis, unspecified site: Secondary | ICD-10-CM | POA: Diagnosis not present

## 2021-06-05 DIAGNOSIS — Z7902 Long term (current) use of antithrombotics/antiplatelets: Secondary | ICD-10-CM | POA: Diagnosis not present

## 2021-06-05 DIAGNOSIS — I129 Hypertensive chronic kidney disease with stage 1 through stage 4 chronic kidney disease, or unspecified chronic kidney disease: Secondary | ICD-10-CM | POA: Diagnosis not present

## 2021-06-05 DIAGNOSIS — I69354 Hemiplegia and hemiparesis following cerebral infarction affecting left non-dominant side: Secondary | ICD-10-CM | POA: Diagnosis not present

## 2021-06-06 DIAGNOSIS — I69354 Hemiplegia and hemiparesis following cerebral infarction affecting left non-dominant side: Secondary | ICD-10-CM | POA: Diagnosis not present

## 2021-06-06 DIAGNOSIS — R5382 Chronic fatigue, unspecified: Secondary | ICD-10-CM | POA: Diagnosis not present

## 2021-06-06 DIAGNOSIS — N183 Chronic kidney disease, stage 3 unspecified: Secondary | ICD-10-CM | POA: Diagnosis not present

## 2021-06-06 DIAGNOSIS — Z7902 Long term (current) use of antithrombotics/antiplatelets: Secondary | ICD-10-CM | POA: Diagnosis not present

## 2021-06-06 DIAGNOSIS — I129 Hypertensive chronic kidney disease with stage 1 through stage 4 chronic kidney disease, or unspecified chronic kidney disease: Secondary | ICD-10-CM | POA: Diagnosis not present

## 2021-06-06 DIAGNOSIS — M329 Systemic lupus erythematosus, unspecified: Secondary | ICD-10-CM | POA: Diagnosis not present

## 2021-06-06 DIAGNOSIS — M1990 Unspecified osteoarthritis, unspecified site: Secondary | ICD-10-CM | POA: Diagnosis not present

## 2021-06-06 NOTE — Telephone Encounter (Signed)
Can we do order for the adjustable single point cane and include this in office notes from visit on 05/15/21

## 2021-06-08 ENCOUNTER — Other Ambulatory Visit: Payer: Self-pay | Admitting: Nurse Practitioner

## 2021-06-08 ENCOUNTER — Telehealth: Payer: Self-pay | Admitting: Family Medicine

## 2021-06-08 MED ORDER — ATENOLOL 25 MG PO TABS: 25.0000 mg | ORAL_TABLET | Freq: Every day | ORAL | 1 refills | Status: DC

## 2021-06-08 MED ORDER — ATORVASTATIN CALCIUM 40 MG PO TABS: 40.0000 mg | ORAL_TABLET | Freq: Every day | ORAL | 0 refills | Status: DC

## 2021-06-08 NOTE — Telephone Encounter (Signed)
Patient was hospitalized at [facility] from 04/30/21 through 05/09/21.  She had an office visit here on 8//30/22 for hospital follow up.

## 2021-06-08 NOTE — Telephone Encounter (Signed)
  Prescription Request  06/08/2021  Is this a "Controlled Substance" medicine? no Have you seen your PCP in the last 2 weeks? 05/15/20 If YES, route message to pool  -  If NO, patient needs to be scheduled for appointment.  What is the name of the medication or equipment? Lipitor and pt was in hospital and they increased her bp meds. She is not sure if she is supposed to keep taking that. If so, she needs a refill  Have you contacted your pharmacy to request a refill?   Which pharmacy would you like this sent to? [facility]   Patient notified that their request is being sent to the clinical staff for review and that they should receive a response within 2 business days.

## 2021-06-09 ENCOUNTER — Other Ambulatory Visit: Payer: Self-pay | Admitting: Nurse Practitioner

## 2021-06-09 DIAGNOSIS — Z8673 Personal history of transient ischemic attack (TIA), and cerebral infarction without residual deficits: Secondary | ICD-10-CM

## 2021-06-13 ENCOUNTER — Ambulatory Visit (INDEPENDENT_AMBULATORY_CARE_PROVIDER_SITE_OTHER): Payer: Medicare Other

## 2021-06-13 ENCOUNTER — Other Ambulatory Visit: Payer: Self-pay

## 2021-06-13 DIAGNOSIS — Z6834 Body mass index (BMI) 34.0-34.9, adult: Secondary | ICD-10-CM

## 2021-06-13 DIAGNOSIS — M1990 Unspecified osteoarthritis, unspecified site: Secondary | ICD-10-CM | POA: Diagnosis not present

## 2021-06-13 DIAGNOSIS — I69354 Hemiplegia and hemiparesis following cerebral infarction affecting left non-dominant side: Secondary | ICD-10-CM | POA: Diagnosis not present

## 2021-06-13 DIAGNOSIS — E669 Obesity, unspecified: Secondary | ICD-10-CM

## 2021-06-13 DIAGNOSIS — N183 Chronic kidney disease, stage 3 unspecified: Secondary | ICD-10-CM | POA: Diagnosis not present

## 2021-06-13 DIAGNOSIS — Z7902 Long term (current) use of antithrombotics/antiplatelets: Secondary | ICD-10-CM

## 2021-06-13 DIAGNOSIS — M329 Systemic lupus erythematosus, unspecified: Secondary | ICD-10-CM | POA: Diagnosis not present

## 2021-06-13 DIAGNOSIS — R5382 Chronic fatigue, unspecified: Secondary | ICD-10-CM | POA: Diagnosis not present

## 2021-06-13 DIAGNOSIS — I129 Hypertensive chronic kidney disease with stage 1 through stage 4 chronic kidney disease, or unspecified chronic kidney disease: Secondary | ICD-10-CM

## 2021-06-14 DIAGNOSIS — R5382 Chronic fatigue, unspecified: Secondary | ICD-10-CM | POA: Diagnosis not present

## 2021-06-14 DIAGNOSIS — Z7902 Long term (current) use of antithrombotics/antiplatelets: Secondary | ICD-10-CM | POA: Diagnosis not present

## 2021-06-14 DIAGNOSIS — N183 Chronic kidney disease, stage 3 unspecified: Secondary | ICD-10-CM | POA: Diagnosis not present

## 2021-06-14 DIAGNOSIS — M329 Systemic lupus erythematosus, unspecified: Secondary | ICD-10-CM | POA: Diagnosis not present

## 2021-06-14 DIAGNOSIS — I69354 Hemiplegia and hemiparesis following cerebral infarction affecting left non-dominant side: Secondary | ICD-10-CM | POA: Diagnosis not present

## 2021-06-14 DIAGNOSIS — I129 Hypertensive chronic kidney disease with stage 1 through stage 4 chronic kidney disease, or unspecified chronic kidney disease: Secondary | ICD-10-CM | POA: Diagnosis not present

## 2021-06-14 DIAGNOSIS — M1990 Unspecified osteoarthritis, unspecified site: Secondary | ICD-10-CM | POA: Diagnosis not present

## 2021-06-15 ENCOUNTER — Telehealth: Payer: Self-pay | Admitting: Family Medicine

## 2021-06-15 DIAGNOSIS — M329 Systemic lupus erythematosus, unspecified: Secondary | ICD-10-CM | POA: Diagnosis not present

## 2021-06-15 DIAGNOSIS — I69354 Hemiplegia and hemiparesis following cerebral infarction affecting left non-dominant side: Secondary | ICD-10-CM | POA: Diagnosis not present

## 2021-06-15 DIAGNOSIS — N183 Chronic kidney disease, stage 3 unspecified: Secondary | ICD-10-CM | POA: Diagnosis not present

## 2021-06-15 DIAGNOSIS — I129 Hypertensive chronic kidney disease with stage 1 through stage 4 chronic kidney disease, or unspecified chronic kidney disease: Secondary | ICD-10-CM | POA: Diagnosis not present

## 2021-06-15 DIAGNOSIS — M1990 Unspecified osteoarthritis, unspecified site: Secondary | ICD-10-CM | POA: Diagnosis not present

## 2021-06-15 DIAGNOSIS — Z7902 Long term (current) use of antithrombotics/antiplatelets: Secondary | ICD-10-CM | POA: Diagnosis not present

## 2021-06-15 DIAGNOSIS — R5382 Chronic fatigue, unspecified: Secondary | ICD-10-CM | POA: Diagnosis not present

## 2021-06-15 NOTE — Telephone Encounter (Signed)
[facility] calling to report pts bp being 158/90 - Pt took medicine for bp about 20 mins ago so she expects for it to come down.

## 2021-06-19 DIAGNOSIS — I69354 Hemiplegia and hemiparesis following cerebral infarction affecting left non-dominant side: Secondary | ICD-10-CM | POA: Diagnosis not present

## 2021-06-19 DIAGNOSIS — M1990 Unspecified osteoarthritis, unspecified site: Secondary | ICD-10-CM | POA: Diagnosis not present

## 2021-06-19 DIAGNOSIS — N183 Chronic kidney disease, stage 3 unspecified: Secondary | ICD-10-CM | POA: Diagnosis not present

## 2021-06-19 DIAGNOSIS — R5382 Chronic fatigue, unspecified: Secondary | ICD-10-CM | POA: Diagnosis not present

## 2021-06-19 DIAGNOSIS — Z7902 Long term (current) use of antithrombotics/antiplatelets: Secondary | ICD-10-CM | POA: Diagnosis not present

## 2021-06-19 DIAGNOSIS — M329 Systemic lupus erythematosus, unspecified: Secondary | ICD-10-CM | POA: Diagnosis not present

## 2021-06-19 DIAGNOSIS — I129 Hypertensive chronic kidney disease with stage 1 through stage 4 chronic kidney disease, or unspecified chronic kidney disease: Secondary | ICD-10-CM | POA: Diagnosis not present

## 2021-06-26 ENCOUNTER — Telehealth: Payer: Self-pay | Admitting: *Deleted

## 2021-06-26 DIAGNOSIS — R5382 Chronic fatigue, unspecified: Secondary | ICD-10-CM | POA: Diagnosis not present

## 2021-06-26 DIAGNOSIS — N183 Chronic kidney disease, stage 3 unspecified: Secondary | ICD-10-CM | POA: Diagnosis not present

## 2021-06-26 DIAGNOSIS — I69354 Hemiplegia and hemiparesis following cerebral infarction affecting left non-dominant side: Secondary | ICD-10-CM | POA: Diagnosis not present

## 2021-06-26 DIAGNOSIS — I129 Hypertensive chronic kidney disease with stage 1 through stage 4 chronic kidney disease, or unspecified chronic kidney disease: Secondary | ICD-10-CM | POA: Diagnosis not present

## 2021-06-26 DIAGNOSIS — Z7902 Long term (current) use of antithrombotics/antiplatelets: Secondary | ICD-10-CM | POA: Diagnosis not present

## 2021-06-26 DIAGNOSIS — M329 Systemic lupus erythematosus, unspecified: Secondary | ICD-10-CM | POA: Diagnosis not present

## 2021-06-26 DIAGNOSIS — M1990 Unspecified osteoarthritis, unspecified site: Secondary | ICD-10-CM | POA: Diagnosis not present

## 2021-06-26 NOTE — Telephone Encounter (Signed)
RTC to pt from her VM regarding cane her HH PT had recommended. Order was placed back on 06/09/21. I faxed this today w/ OV notes to [place]

## 2021-06-27 DIAGNOSIS — I69354 Hemiplegia and hemiparesis following cerebral infarction affecting left non-dominant side: Secondary | ICD-10-CM | POA: Diagnosis not present

## 2021-06-27 DIAGNOSIS — I129 Hypertensive chronic kidney disease with stage 1 through stage 4 chronic kidney disease, or unspecified chronic kidney disease: Secondary | ICD-10-CM | POA: Diagnosis not present

## 2021-06-27 DIAGNOSIS — M1990 Unspecified osteoarthritis, unspecified site: Secondary | ICD-10-CM | POA: Diagnosis not present

## 2021-06-27 DIAGNOSIS — M329 Systemic lupus erythematosus, unspecified: Secondary | ICD-10-CM | POA: Diagnosis not present

## 2021-06-27 DIAGNOSIS — R5382 Chronic fatigue, unspecified: Secondary | ICD-10-CM | POA: Diagnosis not present

## 2021-06-27 DIAGNOSIS — N183 Chronic kidney disease, stage 3 unspecified: Secondary | ICD-10-CM | POA: Diagnosis not present

## 2021-06-27 DIAGNOSIS — Z7902 Long term (current) use of antithrombotics/antiplatelets: Secondary | ICD-10-CM | POA: Diagnosis not present

## 2021-06-29 ENCOUNTER — Telehealth: Payer: Self-pay | Admitting: *Deleted

## 2021-06-29 NOTE — Telephone Encounter (Signed)
Pt rtc appt made for 07/16/21

## 2021-06-29 NOTE — Telephone Encounter (Signed)
Pt's number says it is not in service LMOVM to sister, Dr. Livia Snellen received note from [facility] about pt's BP readings and he would like her to make an appt for her to be seen.

## 2021-07-06 ENCOUNTER — Telehealth: Payer: Self-pay | Admitting: Family Medicine

## 2021-07-06 NOTE — Telephone Encounter (Signed)
Please see visit info FYI from [facility]

## 2021-07-09 ENCOUNTER — Other Ambulatory Visit: Payer: Self-pay | Admitting: Nurse Practitioner

## 2021-07-09 DIAGNOSIS — I639 Cerebral infarction, unspecified: Secondary | ICD-10-CM | POA: Diagnosis not present

## 2021-07-10 DIAGNOSIS — M329 Systemic lupus erythematosus, unspecified: Secondary | ICD-10-CM | POA: Diagnosis not present

## 2021-07-10 DIAGNOSIS — I69354 Hemiplegia and hemiparesis following cerebral infarction affecting left non-dominant side: Secondary | ICD-10-CM | POA: Diagnosis not present

## 2021-07-10 DIAGNOSIS — R5382 Chronic fatigue, unspecified: Secondary | ICD-10-CM | POA: Diagnosis not present

## 2021-07-10 DIAGNOSIS — Z7902 Long term (current) use of antithrombotics/antiplatelets: Secondary | ICD-10-CM | POA: Diagnosis not present

## 2021-07-10 DIAGNOSIS — I129 Hypertensive chronic kidney disease with stage 1 through stage 4 chronic kidney disease, or unspecified chronic kidney disease: Secondary | ICD-10-CM | POA: Diagnosis not present

## 2021-07-10 DIAGNOSIS — N183 Chronic kidney disease, stage 3 unspecified: Secondary | ICD-10-CM | POA: Diagnosis not present

## 2021-07-10 DIAGNOSIS — M1990 Unspecified osteoarthritis, unspecified site: Secondary | ICD-10-CM | POA: Diagnosis not present

## 2021-07-12 DIAGNOSIS — M329 Systemic lupus erythematosus, unspecified: Secondary | ICD-10-CM | POA: Diagnosis not present

## 2021-07-12 DIAGNOSIS — I129 Hypertensive chronic kidney disease with stage 1 through stage 4 chronic kidney disease, or unspecified chronic kidney disease: Secondary | ICD-10-CM | POA: Diagnosis not present

## 2021-07-12 DIAGNOSIS — R5382 Chronic fatigue, unspecified: Secondary | ICD-10-CM | POA: Diagnosis not present

## 2021-07-12 DIAGNOSIS — N183 Chronic kidney disease, stage 3 unspecified: Secondary | ICD-10-CM | POA: Diagnosis not present

## 2021-07-12 DIAGNOSIS — M1990 Unspecified osteoarthritis, unspecified site: Secondary | ICD-10-CM | POA: Diagnosis not present

## 2021-07-12 DIAGNOSIS — I69354 Hemiplegia and hemiparesis following cerebral infarction affecting left non-dominant side: Secondary | ICD-10-CM | POA: Diagnosis not present

## 2021-07-12 DIAGNOSIS — Z7902 Long term (current) use of antithrombotics/antiplatelets: Secondary | ICD-10-CM | POA: Diagnosis not present

## 2021-07-16 ENCOUNTER — Ambulatory Visit (INDEPENDENT_AMBULATORY_CARE_PROVIDER_SITE_OTHER): Payer: Medicare Other | Admitting: Family Medicine

## 2021-07-16 ENCOUNTER — Other Ambulatory Visit: Payer: Self-pay

## 2021-07-16 ENCOUNTER — Encounter: Payer: Self-pay | Admitting: Family Medicine

## 2021-07-16 VITALS — BP 163/88 | HR 86 | Temp 97.9°F | Ht 66.0 in | Wt 196.8 lb

## 2021-07-16 DIAGNOSIS — G4701 Insomnia due to medical condition: Secondary | ICD-10-CM | POA: Diagnosis not present

## 2021-07-16 DIAGNOSIS — R799 Abnormal finding of blood chemistry, unspecified: Secondary | ICD-10-CM | POA: Diagnosis not present

## 2021-07-16 DIAGNOSIS — I69398 Other sequelae of cerebral infarction: Secondary | ICD-10-CM | POA: Diagnosis not present

## 2021-07-16 DIAGNOSIS — R269 Unspecified abnormalities of gait and mobility: Secondary | ICD-10-CM | POA: Diagnosis not present

## 2021-07-16 MED ORDER — ATENOLOL 50 MG PO TABS: 50.0000 mg | ORAL_TABLET | Freq: Every day | ORAL | 3 refills | Status: DC

## 2021-07-16 MED ORDER — ATORVASTATIN CALCIUM 40 MG PO TABS: 40.0000 mg | ORAL_TABLET | Freq: Every day | ORAL | 3 refills | Status: DC

## 2021-07-16 MED ORDER — TRAZODONE HCL 150 MG PO TABS: ORAL_TABLET | ORAL | 5 refills | Status: DC

## 2021-07-16 NOTE — Progress Notes (Signed)
Subjective:  Patient ID: Rebekah [name], female    DOB: [birth year]  Age: 65 y.o. MRN: [id number]  CC: Medical Management of Chronic Issues   HPI Rebekah [name] presents for stroke in August. Just finished therapy. Left side weak. Walking with a cane. Balance is pretty good. Has been improving consistently and doing Rebekah [name] exercises at home.   Not able to sleep. Melatonin helped Rebekah [name] get to sleep. Has frequent awakenings though.   Depression screen [facility] 2/9 07/16/2021 05/15/2021 11/13/2018  Decreased Interest 1 0 3  Down, Depressed, Hopeless _0 PHQ - 2 Score _1 Altered sleeping 3 - 3  Tired, decreased energy 3 - 3  Change in appetite 0 - 3  Feeling bad or failure about yourself  0 - 1  Trouble concentrating 0 - 2  Moving slowly or fidgety/restless 0 - 2  Suicidal thoughts 0 - 0  PHQ-9 Score 8 - 19  Difficult doing work/chores Not difficult at all - -    History Rebekah [name] has a past medical history of Arthritis and Hypertension.   Rebekah [name] has a past surgical history that includes Cesarean section.   Rebekah [name] family history includes Cancer in Rebekah [name] father and mother; Diabetes in Rebekah [name] mother; Hypertension in Rebekah [name] brother, brother, mother, sister, sister, sister, sister, sister, and sister; Stroke in Rebekah [name] mother.Rebekah [name] reports that Rebekah [name] has never smoked. Rebekah [name] has never used smokeless tobacco. Rebekah [name] reports that Rebekah [name] does not drink alcohol and does not use drugs.    ROS Review of Systems  Constitutional: Negative.   HENT: Negative.    Eyes:  Negative for visual disturbance.  Respiratory:  Negative for shortness of breath.   Cardiovascular:  Negative for chest pain.  Gastrointestinal:  Negative for abdominal pain.  Musculoskeletal:  Negative for arthralgias.   Objective:  BP (!) 163/88   Pulse 86   Temp 97.9 F (36.6 C)   Ht _2  (1.676 m)   Wt 196 lb 12.8 oz (89.3 kg)   SpO2 98%   BMI 31.76 kg/m   BP Readings from Last 3 Encounters:  07/16/21 (!) 163/88  05/15/21 124/77   01/20/19 (!) 150/94    Wt Readings from Last 3 Encounters:  07/16/21 196 lb 12.8 oz (89.3 kg)  05/15/21 197 lb (89.4 kg)  01/26/19 214 lb (97.1 kg)     Physical Exam Constitutional:      General: Rebekah [name] is not in acute distress.    Appearance: Rebekah [name] is well-developed.  Cardiovascular:     Rate and Rhythm: Normal rate and regular rhythm.  Pulmonary:     Breath sounds: Normal breath sounds.  Musculoskeletal:        General: Normal range of motion.  Skin:    General: Skin is warm and dry.  Neurological:     Mental Status: Rebekah [name] is alert and oriented to person, place, and time.      Assessment & Plan:   Rebekah [name] was seen today for medical management of chronic issues.  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 -     traZODone (DESYREL) 150  MG tablet; Use from 1/3 to 1 tablet nightly as needed for sleep. -     atenolol (TENORMIN) 50 MG tablet; Take 1 tablet (50 mg total) by mouth daily.      I have changed Rebekah [name]'s atorvastatin and atenolol. I am also having Rebekah [name] start on traZODone. Additionally, I am having Rebekah [name] maintain Rebekah [name] megestrol and clopidogrel.  Allergies as of 07/16/2021   No Known Allergies      Medication List        Accurate as of July 16, 2021  8:49 PM. If you have any questions, ask your nurse or doctor.          atenolol 50 MG tablet Commonly known as: TENORMIN Take 1 tablet (50 mg total) by mouth daily. What changed:  medication strength how much to take Changed by: [name], MD   atorvastatin 40 MG tablet Commonly known as: LIPITOR Take 1 tablet (40 mg total) by mouth daily.   clopidogrel 75 MG  tablet Commonly known as: PLAVIX Take 1 tablet (75 mg total) by mouth daily.   megestrol 400 MG/10ML suspension Commonly known as: MEGACE Take 10 mLs (400 mg total) by mouth daily.   traZODone 150 MG tablet Commonly known as: DESYREL Use from 1/3 to 1 tablet nightly as needed for sleep. Started by: [name], MD         Follow-up: Return in about 6 weeks (around 08/27/2021).  [name], M.D.

## 2021-07-17 LAB — CBC WITH DIFFERENTIAL/PLATELET
Basophils Absolute: 0 x10E3/uL (ref 0.0–0.2)
Basos: 1 %
EOS (ABSOLUTE): 0.1 x10E3/uL (ref 0.0–0.4)
Eos: 1 %
Hematocrit: 31 % — ABNORMAL LOW (ref 34.0–46.6)
Hemoglobin: 9.8 g/dL — ABNORMAL LOW (ref 11.1–15.9)
Immature Grans (Abs): 0.1 x10E3/uL (ref 0.0–0.1)
Immature Granulocytes: 2 %
Lymphocytes Absolute: 0.9 x10E3/uL (ref 0.7–3.1)
Lymphs: 20 %
MCH: 26.7 pg (ref 26.6–33.0)
MCHC: 31.6 g/dL (ref 31.5–35.7)
MCV: 85 fL (ref 79–97)
Monocytes Absolute: 0.2 x10E3/uL (ref 0.1–0.9)
Monocytes: 4 %
Neutrophils Absolute: 3.2 x10E3/uL (ref 1.4–7.0)
Neutrophils: 72 %
Platelets: 532 x10E3/uL — ABNORMAL HIGH (ref 150–450)
RBC: 3.67 x10E6/uL — ABNORMAL LOW (ref 3.77–5.28)
RDW: 17.1 % — ABNORMAL HIGH (ref 11.7–15.4)
WBC: 4.4 x10E3/uL (ref 3.4–10.8)

## 2021-07-17 LAB — CMP14+EGFR
ALT: 12 IU/L (ref 0–32)
AST: 19 IU/L (ref 0–40)
Albumin/Globulin Ratio: 1.1 — ABNORMAL LOW (ref 1.2–2.2)
Albumin: 3.9 g/dL (ref 3.8–4.8)
Alkaline Phosphatase: 60 IU/L (ref 44–121)
BUN/Creatinine Ratio: 11 — ABNORMAL LOW (ref 12–28)
BUN: 17 mg/dL (ref 8–27)
Bilirubin Total: 0.2 mg/dL (ref 0.0–1.2)
CO2: 20 mmol/L (ref 20–29)
Calcium: 9.2 mg/dL (ref 8.7–10.3)
Chloride: 108 mmol/L — ABNORMAL HIGH (ref 96–106)
Creatinine, Ser: 1.51 mg/dL — ABNORMAL HIGH (ref 0.57–1.00)
Globulin, Total: 3.4 g/dL (ref 1.5–4.5)
Glucose: 93 mg/dL (ref 70–99)
Potassium: 4.3 mmol/L (ref 3.5–5.2)
Sodium: 140 mmol/L (ref 134–144)
Total Protein: 7.3 g/dL (ref 6.0–8.5)
eGFR: 38 mL/min/1.73 — ABNORMAL LOW

## 2021-07-19 LAB — TRANSFERRIN: Transferrin: 168 mg/dL — ABNORMAL LOW (ref 192–364)

## 2021-07-19 LAB — FERRITIN: Ferritin: 1051 ng/mL — ABNORMAL HIGH (ref 15–150)

## 2021-07-19 LAB — IRON AND TIBC
Iron Saturation: 14 % — ABNORMAL LOW (ref 15–55)
Iron: 30 ug/dL (ref 27–139)
Total Iron Binding Capacity: 210 ug/dL — ABNORMAL LOW (ref 250–450)
UIBC: 180 ug/dL (ref 118–369)

## 2021-07-19 LAB — SPECIMEN STATUS REPORT

## 2021-08-16 DIAGNOSIS — M7989 Other specified soft tissue disorders: Secondary | ICD-10-CM | POA: Diagnosis not present

## 2021-08-16 DIAGNOSIS — S60511A Abrasion of right hand, initial encounter: Secondary | ICD-10-CM | POA: Diagnosis not present

## 2021-08-16 DIAGNOSIS — S0993XA Unspecified injury of face, initial encounter: Secondary | ICD-10-CM | POA: Diagnosis not present

## 2021-08-16 DIAGNOSIS — Z79899 Other long term (current) drug therapy: Secondary | ICD-10-CM | POA: Diagnosis not present

## 2021-08-16 DIAGNOSIS — M1990 Unspecified osteoarthritis, unspecified site: Secondary | ICD-10-CM | POA: Diagnosis not present

## 2021-08-16 DIAGNOSIS — Z7982 Long term (current) use of aspirin: Secondary | ICD-10-CM | POA: Diagnosis not present

## 2021-08-16 DIAGNOSIS — Z7902 Long term (current) use of antithrombotics/antiplatelets: Secondary | ICD-10-CM | POA: Diagnosis not present

## 2021-08-16 DIAGNOSIS — W010XXA Fall on same level from slipping, tripping and stumbling without subsequent striking against object, initial encounter: Secondary | ICD-10-CM | POA: Diagnosis not present

## 2021-08-16 DIAGNOSIS — S0990XA Unspecified injury of head, initial encounter: Secondary | ICD-10-CM | POA: Diagnosis not present

## 2021-08-16 DIAGNOSIS — I10 Essential (primary) hypertension: Secondary | ICD-10-CM | POA: Diagnosis not present

## 2021-08-16 DIAGNOSIS — G9389 Other specified disorders of brain: Secondary | ICD-10-CM | POA: Diagnosis not present

## 2021-08-16 DIAGNOSIS — Z23 Encounter for immunization: Secondary | ICD-10-CM | POA: Diagnosis not present

## 2021-08-16 DIAGNOSIS — G319 Degenerative disease of nervous system, unspecified: Secondary | ICD-10-CM | POA: Diagnosis not present

## 2021-08-16 DIAGNOSIS — I639 Cerebral infarction, unspecified: Secondary | ICD-10-CM | POA: Diagnosis not present

## 2021-08-16 DIAGNOSIS — S01511A Laceration without foreign body of lip, initial encounter: Secondary | ICD-10-CM | POA: Diagnosis not present

## 2021-08-16 DIAGNOSIS — S60221A Contusion of right hand, initial encounter: Secondary | ICD-10-CM | POA: Diagnosis not present

## 2021-08-16 DIAGNOSIS — Z8673 Personal history of transient ischemic attack (TIA), and cerebral infarction without residual deficits: Secondary | ICD-10-CM | POA: Diagnosis not present

## 2021-09-26 ENCOUNTER — Ambulatory Visit (INDEPENDENT_AMBULATORY_CARE_PROVIDER_SITE_OTHER): Payer: Medicare Other | Admitting: Family Medicine

## 2021-09-26 ENCOUNTER — Encounter: Payer: Self-pay | Admitting: Family Medicine

## 2021-09-26 VITALS — BP 131/74 | HR 91 | Temp 98.2°F | Ht 66.0 in | Wt 180.8 lb

## 2021-09-26 DIAGNOSIS — R5383 Other fatigue: Secondary | ICD-10-CM | POA: Diagnosis not present

## 2021-09-26 DIAGNOSIS — I6930 Unspecified sequelae of cerebral infarction: Secondary | ICD-10-CM

## 2021-09-26 DIAGNOSIS — I10 Essential (primary) hypertension: Secondary | ICD-10-CM | POA: Diagnosis not present

## 2021-09-26 DIAGNOSIS — D509 Iron deficiency anemia, unspecified: Secondary | ICD-10-CM

## 2021-09-26 MED ORDER — ATENOLOL 25 MG PO TABS: ORAL_TABLET | ORAL | 0 refills | Status: DC

## 2021-09-26 MED ORDER — AMLODIPINE BESYLATE 5 MG PO TABS: 5.0000 mg | ORAL_TABLET | Freq: Every day | ORAL | 5 refills | Status: DC

## 2021-09-26 NOTE — Progress Notes (Signed)
Subjective:  Patient ID: Rebekah [name], female    DOB: [birth year]  Age: 66 y.o. MRN: [id number]  CC: Fatigue   HPI Rebekah [name] presents for decreased strength to finish tasks increasing over the last two weeks. Fatigue is so bad that walking from one room to the other is hard. No energy. Exhausted by mild exertion. Had CVA 5 months ago. Taking med for HTN as well.   Depression screen [facility] 2/9 09/26/2021 09/26/2021 07/16/2021  Decreased Interest 2 0 1  Down, Depressed, Hopeless 1 0 1  PHQ - 2 Score 3 0 2  Altered sleeping 0 - 3  Tired, decreased energy 3 - 3  Change in appetite 2 - 0  Feeling bad or failure about yourself  2 - 0  Trouble concentrating 1 - 0  Moving slowly or fidgety/restless 0 - 0  Suicidal thoughts 0 - 0  PHQ-9 Score 11 - 8  Difficult doing work/chores Not difficult at all - Not difficult at all    History Rebekah [name] has a past medical history of Arthritis and [name].   Rebekah [name] has a past surgical history that includes Cesarean section.   Rebekah [name] family history includes Cancer in Rebekah [name] father and mother; Diabetes in Rebekah [name] mother; [name] in Rebekah [name] brother, brother, mother, sister, sister, sister, sister, sister, and sister; Stroke in Rebekah [name] mother.Rebekah [name] reports that Rebekah [name] has never smoked. Rebekah [name] has never used smokeless tobacco. Rebekah [name] reports that Rebekah [name] does not drink alcohol and does not use drugs.    ROS Review of Systems  Constitutional: Negative.   HENT: Negative.    Eyes:  Negative for visual disturbance.  Respiratory:  Negative for shortness of breath.   Cardiovascular:  Negative for chest pain.  Gastrointestinal:  Negative for abdominal pain.  Musculoskeletal:  Negative for arthralgias.   Objective:  BP 131/74    Pulse 91    Temp 98.2 F (36.8 C)    Ht '5\' 6"'  (1.676 m)    Wt 180 lb 12.8 oz (82 kg)    SpO2 100%    BMI 29.18 kg/m   BP Readings from Last 3 Encounters:  09/26/21 131/74  07/16/21 (!) 163/88  05/15/21 124/77    Wt Readings from Last 3  Encounters:  09/26/21 180 lb 12.8 oz (82 kg)  07/16/21 196 lb 12.8 oz (89.3 kg)  05/15/21 197 lb (89.4 kg)     Physical Exam Constitutional:      General: Rebekah [name] is not in acute distress.    Appearance: Rebekah [name] is well-developed.  Cardiovascular:     Rate and Rhythm: Normal rate and regular rhythm.  Pulmonary:     Breath sounds: Normal breath sounds.  Musculoskeletal:        General: Normal range of motion.  Skin:    General: Skin is warm and dry.  Neurological:     Mental Status: Rebekah [name] is alert and oriented to person, place, and time.      Assessment & Plan:   Rebekah [name] was seen today for fatigue.  Diagnoses and all orders for this visit:  Late effect of cerebrovascular accident (CVA)  Fatigue, unspecified type -     CBC with Differential/Platelet -     CMP14+EGFR -     TSH + free T4 -     Brain natriuretic peptide  Iron deficiency anemia, unspecified iron deficiency anemia type -     CBC with Differential/Platelet  Benign essential HTN  Other orders -     amLODipine (NORVASC) 5 MG  tablet; Take 1 tablet (5 mg total) by mouth daily. For blood pressure -     atenolol (TENORMIN) 25 MG tablet; TAke one daily for one week, then 1/2 daily for one week. Then DC this medication.       I have discontinued Lei Dower. Jerger's megestrol. I have also changed Rebekah [name] atenolol. Additionally, I am having Rebekah [name] start on amLODipine. Lastly, I am having Rebekah [name] maintain Rebekah [name] clopidogrel, atorvastatin, and traZODone.  Allergies as of 09/26/2021   No Known Allergies      Medication List        Accurate as of September 26, 2021 11:59 PM. If you have any questions, ask your nurse or doctor.          STOP taking these medications    megestrol 400 MG/10ML suspension Commonly known as: MEGACE Stopped by: [name], MD       TAKE these medications    amLODipine 5 MG tablet Commonly known as: NORVASC Take 1 tablet (5 mg total) by mouth daily. For blood pressure Started by: [name], MD   atenolol 25 MG tablet Commonly known as: TENORMIN TAke one daily for one week, then 1/2 daily for one week. Then DC this medication. What changed:  medication strength how much to take how to take this when to take this additional instructions Changed by: [name], MD   atorvastatin 40 MG tablet Commonly known as: LIPITOR Take 1 tablet (40 mg total) by mouth daily.   clopidogrel 75 MG tablet Commonly known as: PLAVIX Take 1 tablet (75 mg total) by mouth daily.   traZODone 150 MG tablet Commonly known as: DESYREL Use from 1/3 to 1 tablet nightly as needed for sleep.         Follow-up: Return in about 1 month (around 10/27/2021).  [name], M.D.

## 2021-09-26 NOTE — Patient Instructions (Signed)

## 2021-09-27 ENCOUNTER — Telehealth: Payer: Self-pay | Admitting: Family Medicine

## 2021-09-27 LAB — CBC WITH DIFFERENTIAL/PLATELET
Basophils Absolute: 0 10*3/uL (ref 0.0–0.2)
Basos: 1 %
EOS (ABSOLUTE): 0.1 10*3/uL (ref 0.0–0.4)
Eos: 1 %
Hematocrit: 32.1 % — ABNORMAL LOW (ref 34.0–46.6)
Hemoglobin: 10.3 g/dL — ABNORMAL LOW (ref 11.1–15.9)
Immature Grans (Abs): 0 10*3/uL (ref 0.0–0.1)
Immature Granulocytes: 1 %
Lymphocytes Absolute: 0.6 10*3/uL — ABNORMAL LOW (ref 0.7–3.1)
Lymphs: 14 %
MCH: 26.9 pg (ref 26.6–33.0)
MCHC: 32.1 g/dL (ref 31.5–35.7)
MCV: 84 fL (ref 79–97)
Monocytes Absolute: 0.1 10*3/uL (ref 0.1–0.9)
Monocytes: 3 %
Neutrophils Absolute: 3.5 10*3/uL (ref 1.4–7.0)
Neutrophils: 80 %
Platelets: 500 10*3/uL — ABNORMAL HIGH (ref 150–450)
RBC: 3.83 x10E6/uL (ref 3.77–5.28)
RDW: 18.4 % — ABNORMAL HIGH (ref 11.7–15.4)
WBC: 4.4 10*3/uL (ref 3.4–10.8)

## 2021-09-27 LAB — CMP14+EGFR
ALT: 7 IU/L (ref 0–32)
AST: 16 IU/L (ref 0–40)
Albumin/Globulin Ratio: 1.4 (ref 1.2–2.2)
Albumin: 3.9 g/dL (ref 3.8–4.8)
Alkaline Phosphatase: 53 IU/L (ref 44–121)
BUN/Creatinine Ratio: 10 — ABNORMAL LOW (ref 12–28)
BUN: 13 mg/dL (ref 8–27)
Bilirubin Total: 0.6 mg/dL (ref 0.0–1.2)
CO2: 20 mmol/L (ref 20–29)
Calcium: 9.1 mg/dL (ref 8.7–10.3)
Chloride: 102 mmol/L (ref 96–106)
Creatinine, Ser: 1.27 mg/dL — ABNORMAL HIGH (ref 0.57–1.00)
Globulin, Total: 2.7 g/dL (ref 1.5–4.5)
Glucose: 99 mg/dL (ref 70–99)
Potassium: 4.4 mmol/L (ref 3.5–5.2)
Sodium: 135 mmol/L (ref 134–144)
Total Protein: 6.6 g/dL (ref 6.0–8.5)
eGFR: 47 mL/min/{1.73_m2} — ABNORMAL LOW (ref >59)

## 2021-09-27 LAB — TSH+FREE T4
Free T4: 1.2 ng/dL (ref 0.82–1.77)
TSH: 6.15 u[IU]/mL — ABNORMAL HIGH (ref 0.450–4.500)

## 2021-09-27 LAB — BRAIN NATRIURETIC PEPTIDE: BNP: 38 pg/mL (ref 0.0–100.0)

## 2021-09-27 NOTE — Telephone Encounter (Signed)
Pt called wanting to know if she is supposed to finish taking all of her Atenolol Rx before starting her Amlodipine Rx?  Also wants to know if Dr Livia Snellen was going to prescribe her anything for her weakness/low energy? Or is this change in her medicine supposed to help with that?

## 2021-09-27 NOTE — Telephone Encounter (Signed)
Check her med list. I wrote a taper for her to follow for the atenolol.

## 2021-09-28 NOTE — Progress Notes (Signed)
Hello Viera,  Your lab result is normal and/or stable.Some minor variations that are not significant are commonly marked abnormal, but do not represent any medical problem for you.  Best regards, [name], M.D.

## 2021-09-28 NOTE — Telephone Encounter (Signed)
Patient aware.

## 2021-09-29 ENCOUNTER — Encounter: Payer: Self-pay | Admitting: Family Medicine

## 2021-09-29 DIAGNOSIS — D509 Iron deficiency anemia, unspecified: Secondary | ICD-10-CM | POA: Insufficient documentation

## 2021-10-08 ENCOUNTER — Other Ambulatory Visit: Payer: Self-pay | Admitting: Family Medicine

## 2021-10-08 ENCOUNTER — Telehealth: Payer: Self-pay | Admitting: Family Medicine

## 2021-10-08 DIAGNOSIS — R630 Anorexia: Secondary | ICD-10-CM

## 2021-10-08 MED ORDER — MEGESTROL ACETATE 400 MG/10ML PO SUSP: 400.0000 mg | Freq: Every day | ORAL | 1 refills | Status: DC

## 2021-10-08 NOTE — Telephone Encounter (Signed)
Patient aware.

## 2021-10-08 NOTE — Telephone Encounter (Signed)
Please let the patient know that I sent their prescription to their pharmacy. Thanks, WS 

## 2021-10-16 ENCOUNTER — Telehealth: Payer: Self-pay | Admitting: Family Medicine

## 2021-10-16 NOTE — Telephone Encounter (Signed)
done

## 2021-10-31 ENCOUNTER — Ambulatory Visit: Payer: Medicare Other | Admitting: Family Medicine

## 2021-11-13 ENCOUNTER — Encounter: Payer: Self-pay | Admitting: Family Medicine

## 2021-11-13 ENCOUNTER — Ambulatory Visit (INDEPENDENT_AMBULATORY_CARE_PROVIDER_SITE_OTHER): Payer: Medicare Other | Admitting: Family Medicine

## 2021-11-13 VITALS — BP 138/74 | HR 135 | Temp 97.3°F | Ht 66.0 in | Wt 170.8 lb

## 2021-11-13 DIAGNOSIS — E039 Hypothyroidism, unspecified: Secondary | ICD-10-CM | POA: Diagnosis not present

## 2021-11-13 DIAGNOSIS — I10 Essential (primary) hypertension: Secondary | ICD-10-CM

## 2021-11-13 DIAGNOSIS — D509 Iron deficiency anemia, unspecified: Secondary | ICD-10-CM | POA: Diagnosis not present

## 2021-11-13 DIAGNOSIS — N184 Chronic kidney disease, stage 4 (severe): Secondary | ICD-10-CM

## 2021-11-13 DIAGNOSIS — D649 Anemia, unspecified: Secondary | ICD-10-CM

## 2021-11-13 DIAGNOSIS — R000 Tachycardia, unspecified: Secondary | ICD-10-CM | POA: Diagnosis not present

## 2021-11-13 MED ORDER — METOPROLOL SUCCINATE ER 50 MG PO TB24: 50.0000 mg | ORAL_TABLET | Freq: Every day | ORAL | 2 refills | Status: DC

## 2021-11-13 MED ORDER — LEVOTHYROXINE SODIUM 50 MCG PO TABS: 50.0000 ug | ORAL_TABLET | Freq: Every day | ORAL | 0 refills | Status: DC

## 2021-11-13 NOTE — Progress Notes (Signed)
Subjective:  Patient ID: Rebekah [name], female    DOB: [birth year]  Age: 66 y.o. MRN: [id number]  CC: Follow-up (1 month medication follow up just not feeling well and or what's going. Need something to boost her up, per pt feels like Rebekah [name] when Rebekah [name] had her stroke.//Pt having dental work next week, like a current copy of med list/)   HPI Rebekah [name] presents for Not feeling good. Feeling terrible. No strength. Onset 2 months ago. Felt to be the result of CVA in August 2022. Blood work done last month showed a mild increase in the TSH.   Depression screen [facility] 2/9 11/13/2021 09/26/2021 09/26/2021  Decreased Interest 0 2 0  Down, Depressed, Hopeless 0 1 0  PHQ - 2 Score 0 3 0  Altered sleeping 0 0 -  Tired, decreased energy 0 3 -  Change in appetite 0 2 -  Feeling bad or failure about yourself  0 2 -  Trouble concentrating 0 1 -  Moving slowly or fidgety/restless 0 0 -  Suicidal thoughts 0 0 -  PHQ-9 Score 0 11 -  Difficult doing work/chores - Not difficult at all -    History Rebekah [name] has a past medical history of Arthritis and Hypertension.   Rebekah [name] has a past surgical history that includes Cesarean section.   Her family history includes Cancer in her father and mother; Diabetes in her mother; Hypertension in her brother, brother, mother, sister, sister, sister, sister, sister, and sister; Stroke in her mother.Rebekah [name] reports that Rebekah [name] has never smoked. Rebekah [name] has never used smokeless tobacco. Rebekah [name] reports that Rebekah [name] does not drink alcohol and does not use drugs.    ROS Review of Systems  Constitutional:  Positive for fatigue.  HENT: Negative.  Negative for congestion.   Eyes:  Negative for visual disturbance.  Respiratory:  Negative for shortness of breath.   Cardiovascular:  Negative for chest pain.  Gastrointestinal:  Negative for abdominal pain, constipation, diarrhea, nausea and vomiting.  Genitourinary:  Negative for difficulty urinating.  Musculoskeletal:  Negative for  arthralgias and myalgias.  Neurological:  Positive for weakness. Negative for headaches.  Psychiatric/Behavioral:  Negative for sleep disturbance.    Objective:  BP 138/74    Pulse (!) 135    Temp (!) 97.3 F (36.3 C)    Ht '5\' 6"'  (1.676 m)    Wt 170 lb 12.8 oz (77.5 kg)    SpO2 100%    BMI 27.57 kg/m   BP Readings from Last 3 Encounters:  11/13/21 138/74  09/26/21 131/74  07/16/21 (!) 163/88    Wt Readings from Last 3 Encounters:  11/13/21 170 lb 12.8 oz (77.5 kg)  09/26/21 180 lb 12.8 oz (82 kg)  07/16/21 196 lb 12.8 oz (89.3 kg)     Physical Exam Vitals reviewed.  Constitutional:      General: Rebekah [name] is not in acute distress.    Appearance: Rebekah [name] is well-developed. Rebekah [name] is ill-appearing.  HENT:     Head: Normocephalic and atraumatic.  Eyes:     Conjunctiva/sclera: Conjunctivae normal.     Pupils: Pupils are equal, round, and reactive to light.  Neck:     Thyroid: No thyromegaly.  Cardiovascular:     Rate and Rhythm: Normal rate and regular rhythm.     Heart sounds: Normal heart sounds. No murmur heard. Pulmonary:     Effort: Pulmonary effort is normal. No respiratory distress.     Breath sounds: Normal breath sounds. No wheezing or  rales.  Abdominal:     General: Bowel sounds are normal. There is no distension.     Palpations: Abdomen is soft.     Tenderness: There is no abdominal tenderness.  Musculoskeletal:        General: Normal range of motion.     Cervical back: Normal range of motion and neck supple.  Lymphadenopathy:     Cervical: No cervical adenopathy.  Skin:    General: Skin is warm and dry.  Neurological:     Mental Status: Rebekah [name] is alert and oriented to person, place, and time.  Psychiatric:        Behavior: Behavior normal.        Thought Content: Thought content normal.        Judgment: Judgment normal.      Assessment & Plan:   Rebekah [name] was seen today for follow-up.  Diagnoses and all orders for this visit:  Chronic kidney disease (CKD),  stage IV (severe) (HCC)  Benign essential HTN  Iron deficiency anemia, unspecified iron deficiency anemia type  Tachycardia  Hypothyroidism, unspecified type -     CBC with Differential/Platelet -     CMP14+EGFR  Other orders -     metoprolol succinate (TOPROL-XL) 50 MG 24 hr tablet; Take 1 tablet (50 mg total) by mouth daily. For blood pressure control -     levothyroxine (SYNTHROID) 50 MCG tablet; Take 1 tablet (50 mcg total) by mouth daily. Take on an empty stomach       I have discontinued Rebekah [name]'s atenolol. I am also having her start on metoprolol succinate and levothyroxine. Additionally, I am having her maintain her clopidogrel, atorvastatin, traZODone, amLODipine, and megestrol.  Allergies as of 11/13/2021   No Known Allergies      Medication List        Accurate as of November 13, 2021  7:50 PM. If you have any questions, ask your nurse or doctor.          STOP taking these medications    atenolol 25 MG tablet Commonly known as: TENORMIN Stopped by: [name], MD       TAKE these medications    amLODipine 5 MG tablet Commonly known as: NORVASC Take 1 tablet (5 mg total) by mouth daily. For blood pressure   atorvastatin 40 MG tablet Commonly known as: LIPITOR Take 1 tablet (40 mg total) by mouth daily.   clopidogrel 75 MG tablet Commonly known as: PLAVIX Take 1 tablet (75 mg total) by mouth daily.   levothyroxine 50 MCG tablet Commonly known as: SYNTHROID Take 1 tablet (50 mcg total) by mouth daily. Take on an empty stomach Started by: [name], MD   megestrol 400 MG/10ML suspension Commonly known as: MEGACE Take 10 mLs (400 mg total) by mouth daily.   metoprolol succinate 50 MG 24 hr tablet Commonly known as: TOPROL-XL Take 1 tablet (50 mg total) by mouth daily. For blood pressure control Started by: [name], MD   traZODone 150 MG tablet Commonly known as: DESYREL Use from 1/3 to 1 tablet nightly as needed  for sleep.         Follow-up: Return in about 1 month (around 12/11/2021).  [name], M.D.

## 2021-11-14 NOTE — Addendum Note (Signed)
Addended by: Baldomero Lamy B on: 11/14/2021 11:27 AM   Modules accepted: Orders

## 2021-11-15 LAB — CBC WITH DIFFERENTIAL/PLATELET
Basophils Absolute: 0 10*3/uL (ref 0.0–0.2)
Basos: 0 %
EOS (ABSOLUTE): 0 10*3/uL (ref 0.0–0.4)
Eos: 0 %
Hematocrit: 27.7 % — ABNORMAL LOW (ref 34.0–46.6)
Hemoglobin: 8.7 g/dL — CL (ref 11.1–15.9)
Immature Grans (Abs): 0.1 10*3/uL (ref 0.0–0.1)
Immature Granulocytes: 2 %
Lymphocytes Absolute: 1.1 10*3/uL (ref 0.7–3.1)
Lymphs: 16 %
MCH: 25.4 pg — ABNORMAL LOW (ref 26.6–33.0)
MCHC: 31.4 g/dL — ABNORMAL LOW (ref 31.5–35.7)
MCV: 81 fL (ref 79–97)
Monocytes Absolute: 0.2 10*3/uL (ref 0.1–0.9)
Monocytes: 3 %
Neutrophils Absolute: 5.4 10*3/uL (ref 1.4–7.0)
Neutrophils: 79 %
Platelets: 629 10*3/uL — ABNORMAL HIGH (ref 150–450)
RBC: 3.43 x10E6/uL — ABNORMAL LOW (ref 3.77–5.28)
RDW: 17.2 % — ABNORMAL HIGH (ref 11.7–15.4)
WBC: 6.8 10*3/uL (ref 3.4–10.8)

## 2021-11-15 LAB — CMP14+EGFR
ALT: 7 IU/L (ref 0–32)
AST: 25 IU/L (ref 0–40)
Albumin/Globulin Ratio: 0.9 — ABNORMAL LOW (ref 1.2–2.2)
Albumin: 3.5 g/dL — ABNORMAL LOW (ref 3.8–4.8)
Alkaline Phosphatase: 72 IU/L (ref 44–121)
BUN/Creatinine Ratio: 12 (ref 12–28)
BUN: 17 mg/dL (ref 8–27)
Bilirubin Total: 0.4 mg/dL (ref 0.0–1.2)
CO2: 20 mmol/L (ref 20–29)
Calcium: 9 mg/dL (ref 8.7–10.3)
Chloride: 107 mmol/L — ABNORMAL HIGH (ref 96–106)
Creatinine, Ser: 1.39 mg/dL — ABNORMAL HIGH (ref 0.57–1.00)
Globulin, Total: 3.8 g/dL (ref 1.5–4.5)
Glucose: 95 mg/dL (ref 70–99)
Potassium: 4.6 mmol/L (ref 3.5–5.2)
Sodium: 139 mmol/L (ref 134–144)
Total Protein: 7.3 g/dL (ref 6.0–8.5)
eGFR: 42 mL/min/{1.73_m2} — ABNORMAL LOW (ref >59)

## 2021-11-16 LAB — FERRITIN: Ferritin: 972 ng/mL — ABNORMAL HIGH (ref 15–150)

## 2021-11-16 LAB — IRON AND TIBC
Iron Saturation: 14 % — ABNORMAL LOW (ref 15–55)
Iron: 22 ug/dL — ABNORMAL LOW (ref 27–139)
Total Iron Binding Capacity: 161 ug/dL — ABNORMAL LOW (ref 250–450)
UIBC: 139 ug/dL (ref 118–369)

## 2021-11-16 LAB — SPECIMEN STATUS REPORT

## 2021-11-20 ENCOUNTER — Ambulatory Visit: Payer: Medicare Other | Admitting: Family Medicine

## 2021-11-22 ENCOUNTER — Other Ambulatory Visit: Payer: Self-pay | Admitting: Family Medicine

## 2021-11-22 DIAGNOSIS — R7989 Other specified abnormal findings of blood chemistry: Secondary | ICD-10-CM

## 2021-11-23 DIAGNOSIS — M329 Systemic lupus erythematosus, unspecified: Secondary | ICD-10-CM | POA: Diagnosis not present

## 2021-11-23 DIAGNOSIS — M1711 Unilateral primary osteoarthritis, right knee: Secondary | ICD-10-CM | POA: Diagnosis not present

## 2021-11-23 DIAGNOSIS — M25561 Pain in right knee: Secondary | ICD-10-CM | POA: Diagnosis not present

## 2021-11-23 DIAGNOSIS — I10 Essential (primary) hypertension: Secondary | ICD-10-CM | POA: Diagnosis not present

## 2021-11-23 DIAGNOSIS — Z79899 Other long term (current) drug therapy: Secondary | ICD-10-CM | POA: Diagnosis not present

## 2021-11-23 DIAGNOSIS — Z7902 Long term (current) use of antithrombotics/antiplatelets: Secondary | ICD-10-CM | POA: Diagnosis not present

## 2021-11-23 DIAGNOSIS — Z8673 Personal history of transient ischemic attack (TIA), and cerebral infarction without residual deficits: Secondary | ICD-10-CM | POA: Diagnosis not present

## 2021-11-23 DIAGNOSIS — Z7982 Long term (current) use of aspirin: Secondary | ICD-10-CM | POA: Diagnosis not present

## 2021-11-29 DIAGNOSIS — M1711 Unilateral primary osteoarthritis, right knee: Secondary | ICD-10-CM | POA: Diagnosis not present

## 2021-11-29 DIAGNOSIS — R52 Pain, unspecified: Secondary | ICD-10-CM | POA: Diagnosis not present

## 2021-11-29 DIAGNOSIS — N183 Chronic kidney disease, stage 3 unspecified: Secondary | ICD-10-CM | POA: Diagnosis not present

## 2021-11-29 DIAGNOSIS — L932 Other local lupus erythematosus: Secondary | ICD-10-CM | POA: Diagnosis not present

## 2021-11-29 DIAGNOSIS — M25561 Pain in right knee: Secondary | ICD-10-CM | POA: Diagnosis not present

## 2021-11-29 DIAGNOSIS — M329 Systemic lupus erythematosus, unspecified: Secondary | ICD-10-CM | POA: Diagnosis not present

## 2021-11-29 DIAGNOSIS — D631 Anemia in chronic kidney disease: Secondary | ICD-10-CM | POA: Diagnosis not present

## 2021-11-30 DIAGNOSIS — R9431 Abnormal electrocardiogram [ECG] [EKG]: Secondary | ICD-10-CM | POA: Diagnosis not present

## 2021-11-30 DIAGNOSIS — R7989 Other specified abnormal findings of blood chemistry: Secondary | ICD-10-CM | POA: Diagnosis not present

## 2021-11-30 DIAGNOSIS — R52 Pain, unspecified: Secondary | ICD-10-CM | POA: Diagnosis not present

## 2021-11-30 DIAGNOSIS — M1711 Unilateral primary osteoarthritis, right knee: Secondary | ICD-10-CM | POA: Diagnosis not present

## 2021-11-30 DIAGNOSIS — Z8673 Personal history of transient ischemic attack (TIA), and cerebral infarction without residual deficits: Secondary | ICD-10-CM | POA: Diagnosis not present

## 2021-11-30 DIAGNOSIS — I129 Hypertensive chronic kidney disease with stage 1 through stage 4 chronic kidney disease, or unspecified chronic kidney disease: Secondary | ICD-10-CM | POA: Diagnosis not present

## 2021-11-30 DIAGNOSIS — R410 Disorientation, unspecified: Secondary | ICD-10-CM | POA: Diagnosis not present

## 2021-11-30 DIAGNOSIS — Z79899 Other long term (current) drug therapy: Secondary | ICD-10-CM | POA: Diagnosis not present

## 2021-11-30 DIAGNOSIS — G9389 Other specified disorders of brain: Secondary | ICD-10-CM | POA: Diagnosis not present

## 2021-11-30 DIAGNOSIS — M25461 Effusion, right knee: Secondary | ICD-10-CM | POA: Diagnosis not present

## 2021-11-30 DIAGNOSIS — M25561 Pain in right knee: Secondary | ICD-10-CM | POA: Diagnosis not present

## 2021-11-30 DIAGNOSIS — G319 Degenerative disease of nervous system, unspecified: Secondary | ICD-10-CM | POA: Diagnosis not present

## 2021-11-30 DIAGNOSIS — R9082 White matter disease, unspecified: Secondary | ICD-10-CM | POA: Diagnosis not present

## 2021-11-30 DIAGNOSIS — N183 Chronic kidney disease, stage 3 unspecified: Secondary | ICD-10-CM | POA: Diagnosis not present

## 2021-11-30 DIAGNOSIS — M329 Systemic lupus erythematosus, unspecified: Secondary | ICD-10-CM | POA: Diagnosis not present

## 2021-11-30 DIAGNOSIS — D631 Anemia in chronic kidney disease: Secondary | ICD-10-CM | POA: Diagnosis not present

## 2021-11-30 DIAGNOSIS — R944 Abnormal results of kidney function studies: Secondary | ICD-10-CM | POA: Diagnosis not present

## 2021-11-30 DIAGNOSIS — D649 Anemia, unspecified: Secondary | ICD-10-CM | POA: Diagnosis not present

## 2021-12-03 DIAGNOSIS — R109 Unspecified abdominal pain: Secondary | ICD-10-CM | POA: Diagnosis not present

## 2021-12-03 DIAGNOSIS — R1013 Epigastric pain: Secondary | ICD-10-CM | POA: Diagnosis not present

## 2021-12-03 DIAGNOSIS — D508 Other iron deficiency anemias: Secondary | ICD-10-CM | POA: Diagnosis not present

## 2021-12-03 DIAGNOSIS — R55 Syncope and collapse: Secondary | ICD-10-CM | POA: Diagnosis not present

## 2021-12-03 DIAGNOSIS — I69315 Cognitive social or emotional deficit following cerebral infarction: Secondary | ICD-10-CM | POA: Diagnosis not present

## 2021-12-03 DIAGNOSIS — R29704 NIHSS score 4: Secondary | ICD-10-CM | POA: Diagnosis not present

## 2021-12-03 DIAGNOSIS — D649 Anemia, unspecified: Secondary | ICD-10-CM | POA: Diagnosis not present

## 2021-12-03 DIAGNOSIS — R2689 Other abnormalities of gait and mobility: Secondary | ICD-10-CM | POA: Diagnosis not present

## 2021-12-03 DIAGNOSIS — G3189 Other specified degenerative diseases of nervous system: Secondary | ICD-10-CM | POA: Diagnosis not present

## 2021-12-03 DIAGNOSIS — I959 Hypotension, unspecified: Secondary | ICD-10-CM | POA: Diagnosis not present

## 2021-12-03 DIAGNOSIS — I69351 Hemiplegia and hemiparesis following cerebral infarction affecting right dominant side: Secondary | ICD-10-CM | POA: Diagnosis not present

## 2021-12-03 DIAGNOSIS — N179 Acute kidney failure, unspecified: Secondary | ICD-10-CM | POA: Diagnosis not present

## 2021-12-03 DIAGNOSIS — L89312 Pressure ulcer of right buttock, stage 2: Secondary | ICD-10-CM | POA: Diagnosis not present

## 2021-12-03 DIAGNOSIS — R451 Restlessness and agitation: Secondary | ICD-10-CM | POA: Diagnosis not present

## 2021-12-03 DIAGNOSIS — R778 Other specified abnormalities of plasma proteins: Secondary | ICD-10-CM | POA: Diagnosis not present

## 2021-12-03 DIAGNOSIS — R531 Weakness: Secondary | ICD-10-CM | POA: Diagnosis not present

## 2021-12-03 DIAGNOSIS — R404 Transient alteration of awareness: Secondary | ICD-10-CM | POA: Diagnosis not present

## 2021-12-03 DIAGNOSIS — I6523 Occlusion and stenosis of bilateral carotid arteries: Secondary | ICD-10-CM | POA: Diagnosis not present

## 2021-12-03 DIAGNOSIS — I69311 Memory deficit following cerebral infarction: Secondary | ICD-10-CM | POA: Diagnosis not present

## 2021-12-03 DIAGNOSIS — I639 Cerebral infarction, unspecified: Secondary | ICD-10-CM | POA: Diagnosis not present

## 2021-12-03 DIAGNOSIS — N390 Urinary tract infection, site not specified: Secondary | ICD-10-CM | POA: Diagnosis not present

## 2021-12-03 DIAGNOSIS — I6389 Other cerebral infarction: Secondary | ICD-10-CM | POA: Diagnosis not present

## 2021-12-03 DIAGNOSIS — E039 Hypothyroidism, unspecified: Secondary | ICD-10-CM | POA: Diagnosis not present

## 2021-12-03 DIAGNOSIS — I6782 Cerebral ischemia: Secondary | ICD-10-CM | POA: Diagnosis not present

## 2021-12-03 DIAGNOSIS — F32A Depression, unspecified: Secondary | ICD-10-CM | POA: Diagnosis not present

## 2021-12-03 DIAGNOSIS — I129 Hypertensive chronic kidney disease with stage 1 through stage 4 chronic kidney disease, or unspecified chronic kidney disease: Secondary | ICD-10-CM | POA: Diagnosis not present

## 2021-12-03 DIAGNOSIS — N184 Chronic kidney disease, stage 4 (severe): Secondary | ICD-10-CM | POA: Diagnosis not present

## 2021-12-03 DIAGNOSIS — L89152 Pressure ulcer of sacral region, stage 2: Secondary | ICD-10-CM | POA: Diagnosis not present

## 2021-12-03 DIAGNOSIS — N1832 Chronic kidney disease, stage 3b: Secondary | ICD-10-CM | POA: Diagnosis not present

## 2021-12-03 DIAGNOSIS — Z743 Need for continuous supervision: Secondary | ICD-10-CM | POA: Diagnosis not present

## 2021-12-03 DIAGNOSIS — I69398 Other sequelae of cerebral infarction: Secondary | ICD-10-CM | POA: Diagnosis not present

## 2021-12-03 DIAGNOSIS — R5383 Other fatigue: Secondary | ICD-10-CM | POA: Diagnosis not present

## 2021-12-03 DIAGNOSIS — K5900 Constipation, unspecified: Secondary | ICD-10-CM | POA: Diagnosis not present

## 2021-12-03 DIAGNOSIS — I082 Rheumatic disorders of both aortic and tricuspid valves: Secondary | ICD-10-CM | POA: Diagnosis not present

## 2021-12-03 DIAGNOSIS — N189 Chronic kidney disease, unspecified: Secondary | ICD-10-CM | POA: Diagnosis not present

## 2021-12-03 DIAGNOSIS — R000 Tachycardia, unspecified: Secondary | ICD-10-CM | POA: Diagnosis not present

## 2021-12-03 DIAGNOSIS — R195 Other fecal abnormalities: Secondary | ICD-10-CM | POA: Diagnosis not present

## 2021-12-03 DIAGNOSIS — I6930 Unspecified sequelae of cerebral infarction: Secondary | ICD-10-CM | POA: Diagnosis not present

## 2021-12-03 DIAGNOSIS — I951 Orthostatic hypotension: Secondary | ICD-10-CM | POA: Diagnosis not present

## 2021-12-03 DIAGNOSIS — R569 Unspecified convulsions: Secondary | ICD-10-CM | POA: Diagnosis not present

## 2021-12-03 DIAGNOSIS — M329 Systemic lupus erythematosus, unspecified: Secondary | ICD-10-CM | POA: Diagnosis not present

## 2021-12-03 DIAGNOSIS — L89322 Pressure ulcer of left buttock, stage 2: Secondary | ICD-10-CM | POA: Diagnosis not present

## 2021-12-03 DIAGNOSIS — D509 Iron deficiency anemia, unspecified: Secondary | ICD-10-CM | POA: Diagnosis not present

## 2021-12-11 ENCOUNTER — Ambulatory Visit: Payer: Medicare Other | Admitting: Family Medicine

## 2021-12-12 ENCOUNTER — Encounter: Payer: Self-pay | Admitting: Family Medicine

## 2021-12-13 DIAGNOSIS — I499 Cardiac arrhythmia, unspecified: Secondary | ICD-10-CM | POA: Diagnosis not present

## 2021-12-13 DIAGNOSIS — R29898 Other symptoms and signs involving the musculoskeletal system: Secondary | ICD-10-CM | POA: Diagnosis not present

## 2021-12-13 DIAGNOSIS — I10 Essential (primary) hypertension: Secondary | ICD-10-CM | POA: Diagnosis not present

## 2021-12-13 DIAGNOSIS — G459 Transient cerebral ischemic attack, unspecified: Secondary | ICD-10-CM | POA: Diagnosis not present

## 2021-12-13 DIAGNOSIS — R55 Syncope and collapse: Secondary | ICD-10-CM | POA: Diagnosis not present

## 2021-12-13 DIAGNOSIS — I69398 Other sequelae of cerebral infarction: Secondary | ICD-10-CM | POA: Diagnosis not present

## 2021-12-13 DIAGNOSIS — I639 Cerebral infarction, unspecified: Secondary | ICD-10-CM | POA: Diagnosis not present

## 2021-12-13 DIAGNOSIS — R8271 Bacteriuria: Secondary | ICD-10-CM | POA: Diagnosis not present

## 2021-12-13 DIAGNOSIS — F32A Depression, unspecified: Secondary | ICD-10-CM | POA: Diagnosis not present

## 2021-12-13 DIAGNOSIS — D508 Other iron deficiency anemias: Secondary | ICD-10-CM | POA: Diagnosis not present

## 2021-12-13 DIAGNOSIS — R569 Unspecified convulsions: Secondary | ICD-10-CM | POA: Diagnosis not present

## 2021-12-13 DIAGNOSIS — L89322 Pressure ulcer of left buttock, stage 2: Secondary | ICD-10-CM | POA: Diagnosis not present

## 2021-12-13 DIAGNOSIS — Z8673 Personal history of transient ischemic attack (TIA), and cerebral infarction without residual deficits: Secondary | ICD-10-CM | POA: Diagnosis not present

## 2021-12-13 DIAGNOSIS — L89152 Pressure ulcer of sacral region, stage 2: Secondary | ICD-10-CM | POA: Diagnosis not present

## 2021-12-13 DIAGNOSIS — I69311 Memory deficit following cerebral infarction: Secondary | ICD-10-CM | POA: Diagnosis not present

## 2021-12-13 DIAGNOSIS — D649 Anemia, unspecified: Secondary | ICD-10-CM | POA: Diagnosis not present

## 2021-12-13 DIAGNOSIS — Z7409 Other reduced mobility: Secondary | ICD-10-CM | POA: Diagnosis not present

## 2021-12-13 DIAGNOSIS — I69314 Frontal lobe and executive function deficit following cerebral infarction: Secondary | ICD-10-CM | POA: Diagnosis not present

## 2021-12-13 DIAGNOSIS — Z743 Need for continuous supervision: Secondary | ICD-10-CM | POA: Diagnosis not present

## 2021-12-13 DIAGNOSIS — R451 Restlessness and agitation: Secondary | ICD-10-CM | POA: Diagnosis not present

## 2021-12-13 DIAGNOSIS — R109 Unspecified abdominal pain: Secondary | ICD-10-CM | POA: Diagnosis not present

## 2021-12-13 DIAGNOSIS — D509 Iron deficiency anemia, unspecified: Secondary | ICD-10-CM | POA: Diagnosis not present

## 2021-12-13 DIAGNOSIS — K5900 Constipation, unspecified: Secondary | ICD-10-CM | POA: Diagnosis not present

## 2021-12-13 DIAGNOSIS — I69351 Hemiplegia and hemiparesis following cerebral infarction affecting right dominant side: Secondary | ICD-10-CM | POA: Diagnosis not present

## 2021-12-13 DIAGNOSIS — R2689 Other abnormalities of gait and mobility: Secondary | ICD-10-CM | POA: Diagnosis not present

## 2021-12-13 DIAGNOSIS — N189 Chronic kidney disease, unspecified: Secondary | ICD-10-CM | POA: Diagnosis not present

## 2021-12-13 DIAGNOSIS — M329 Systemic lupus erythematosus, unspecified: Secondary | ICD-10-CM | POA: Diagnosis not present

## 2021-12-13 DIAGNOSIS — I129 Hypertensive chronic kidney disease with stage 1 through stage 4 chronic kidney disease, or unspecified chronic kidney disease: Secondary | ICD-10-CM | POA: Diagnosis not present

## 2021-12-13 DIAGNOSIS — N183 Chronic kidney disease, stage 3 unspecified: Secondary | ICD-10-CM | POA: Diagnosis not present

## 2021-12-13 DIAGNOSIS — N184 Chronic kidney disease, stage 4 (severe): Secondary | ICD-10-CM | POA: Diagnosis not present

## 2021-12-13 DIAGNOSIS — I69315 Cognitive social or emotional deficit following cerebral infarction: Secondary | ICD-10-CM | POA: Diagnosis not present

## 2021-12-13 DIAGNOSIS — I951 Orthostatic hypotension: Secondary | ICD-10-CM | POA: Diagnosis not present

## 2021-12-13 DIAGNOSIS — R5383 Other fatigue: Secondary | ICD-10-CM | POA: Diagnosis not present

## 2021-12-13 DIAGNOSIS — E039 Hypothyroidism, unspecified: Secondary | ICD-10-CM | POA: Diagnosis not present

## 2021-12-13 DIAGNOSIS — I959 Hypotension, unspecified: Secondary | ICD-10-CM | POA: Diagnosis not present

## 2021-12-13 DIAGNOSIS — L89312 Pressure ulcer of right buttock, stage 2: Secondary | ICD-10-CM | POA: Diagnosis not present

## 2021-12-13 DIAGNOSIS — N390 Urinary tract infection, site not specified: Secondary | ICD-10-CM | POA: Diagnosis not present

## 2021-12-14 DIAGNOSIS — I69314 Frontal lobe and executive function deficit following cerebral infarction: Secondary | ICD-10-CM | POA: Diagnosis not present

## 2021-12-14 DIAGNOSIS — I10 Essential (primary) hypertension: Secondary | ICD-10-CM | POA: Diagnosis not present

## 2021-12-14 DIAGNOSIS — Z7409 Other reduced mobility: Secondary | ICD-10-CM | POA: Diagnosis not present

## 2021-12-14 DIAGNOSIS — R2689 Other abnormalities of gait and mobility: Secondary | ICD-10-CM | POA: Diagnosis not present

## 2021-12-15 DIAGNOSIS — I10 Essential (primary) hypertension: Secondary | ICD-10-CM | POA: Diagnosis not present

## 2021-12-15 DIAGNOSIS — I69314 Frontal lobe and executive function deficit following cerebral infarction: Secondary | ICD-10-CM | POA: Diagnosis not present

## 2021-12-15 DIAGNOSIS — Z7409 Other reduced mobility: Secondary | ICD-10-CM | POA: Diagnosis not present

## 2021-12-15 DIAGNOSIS — R2689 Other abnormalities of gait and mobility: Secondary | ICD-10-CM | POA: Diagnosis not present

## 2021-12-16 DIAGNOSIS — I10 Essential (primary) hypertension: Secondary | ICD-10-CM | POA: Diagnosis not present

## 2021-12-16 DIAGNOSIS — Z7409 Other reduced mobility: Secondary | ICD-10-CM | POA: Diagnosis not present

## 2021-12-16 DIAGNOSIS — R2689 Other abnormalities of gait and mobility: Secondary | ICD-10-CM | POA: Diagnosis not present

## 2021-12-16 DIAGNOSIS — I69314 Frontal lobe and executive function deficit following cerebral infarction: Secondary | ICD-10-CM | POA: Diagnosis not present

## 2021-12-31 ENCOUNTER — Ambulatory Visit: Payer: Medicare Other | Admitting: Family Medicine

## 2022-01-09 ENCOUNTER — Non-Acute Institutional Stay (SKILLED_NURSING_FACILITY): Payer: Medicare Other | Admitting: Adult Health

## 2022-01-09 ENCOUNTER — Encounter: Payer: Self-pay | Admitting: Adult Health

## 2022-01-09 ENCOUNTER — Encounter: Payer: Self-pay | Admitting: Family Medicine

## 2022-01-09 DIAGNOSIS — Z872 Personal history of diseases of the skin and subcutaneous tissue: Secondary | ICD-10-CM

## 2022-01-09 DIAGNOSIS — Z8673 Personal history of transient ischemic attack (TIA), and cerebral infarction without residual deficits: Secondary | ICD-10-CM

## 2022-01-09 DIAGNOSIS — D649 Anemia, unspecified: Secondary | ICD-10-CM | POA: Diagnosis not present

## 2022-01-09 DIAGNOSIS — E039 Hypothyroidism, unspecified: Secondary | ICD-10-CM

## 2022-01-09 DIAGNOSIS — N184 Chronic kidney disease, stage 4 (severe): Secondary | ICD-10-CM

## 2022-01-09 DIAGNOSIS — E785 Hyperlipidemia, unspecified: Secondary | ICD-10-CM

## 2022-01-09 DIAGNOSIS — I69398 Other sequelae of cerebral infarction: Secondary | ICD-10-CM | POA: Insufficient documentation

## 2022-01-09 DIAGNOSIS — K5909 Other constipation: Secondary | ICD-10-CM | POA: Diagnosis not present

## 2022-01-09 DIAGNOSIS — R29818 Other symptoms and signs involving the nervous system: Secondary | ICD-10-CM | POA: Diagnosis not present

## 2022-01-09 DIAGNOSIS — F339 Major depressive disorder, recurrent, unspecified: Secondary | ICD-10-CM

## 2022-01-09 DIAGNOSIS — I10 Essential (primary) hypertension: Secondary | ICD-10-CM | POA: Diagnosis not present

## 2022-01-09 DIAGNOSIS — R569 Unspecified convulsions: Secondary | ICD-10-CM

## 2022-01-09 DIAGNOSIS — K219 Gastro-esophageal reflux disease without esophagitis: Secondary | ICD-10-CM | POA: Diagnosis not present

## 2022-01-09 DIAGNOSIS — R4189 Other symptoms and signs involving cognitive functions and awareness: Secondary | ICD-10-CM

## 2022-01-09 DIAGNOSIS — D75839 Thrombocytosis, unspecified: Secondary | ICD-10-CM

## 2022-01-09 DIAGNOSIS — R630 Anorexia: Secondary | ICD-10-CM

## 2022-01-09 NOTE — Progress Notes (Signed)
?Location:  [place] ?Nursing Home Room Number: T/700/F ?Place of Service:  SNF (31) ? ? ?CODE STATUS: FULL ? ?No Known Allergies ? ?Chief Complaint  ?Patient presents with  ? Hospitalization Follow-up  ?  Patient is being seen for hospital follow up  ? ? ?HPI: ? ?She is a 66 year old woman who has been hospitalized from 12-13-21 through 01-08-22. Her medical history includes: previous cva; hypothyroidism; hypertension. She presented to the ED following abnormalities of gait adls and mobility. She had a syncopal episode. She was found to have old infarcts; age indeterminate cortical infarct posterior occipital lobe infarct and mild chronic small vessel disease. Acute cva due to ischemic left hemisphere cerebral infarctions. She was admitted to acute rehab to improve upon her independence with her adls.  She had a seizure on 12-16-21 was restarted on keppra. At this time she is here for short term rehab with her goal to return back home. 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 Benign essential; HTN ? ?Past Medical History:  ?Diagnosis Date  ? Arthritis   ? Rheumatoid  ? Hypertension   ? ? ?Past Surgical History:  ?Procedure Laterality Date  ? CESAREAN SECTION    ? ? ?Social History  ? ?Socioeconomic History  ? Marital status: Single  ?  Spouse name: Not on file  ? Number of children: Not on file  ? Years of education: Not on file  ? Highest education level: Not on file  ?Occupational History  ? Not on file  ?Tobacco Use  ? Smoking status: Never  ? Smokeless tobacco: Never  ?Vaping Use  ? Vaping Use: Never used  ?Substance and Sexual Activity  ? Alcohol use: No  ?  Alcohol/week: 0.0 standard drinks  ? Drug use: No  ? Sexual activity: Not on file  ?Other Topics Concern  ? Not on file  ?Social History Narrative  ? Lives with sister.    ? ?Social  Determinants of Health  ? ?Financial Resource Strain: Not on file  ?Food Insecurity: Not on file  ?Transportation Needs: Not on file  ?Physical Activity: Not on file  ?Stress: Not on file  ?Social Connections: Not on file  ?Intimate Partner Violence: Not on file  ? ?Family History  ?Problem Relation Age of Onset  ? Diabetes Mother   ? Hypertension Mother   ? Cancer Mother   ? Stroke Mother   ? Cancer Father   ?     Lung  ? Hypertension Sister   ? Hypertension Brother   ? Hypertension Sister   ? Hypertension Sister   ? Hypertension Sister   ? Hypertension Brother   ? Hypertension Sister   ? Hypertension Sister   ? ? ? ? ?VITAL SIGNS ?BP 124/68   Pulse 80   Temp 97.6 ?F (36.4 ?C)   Resp 20   Wt 142 lb (64.4 kg)   SpO2 96%   BMI 22.92 kg/m?  ? ?Outpatient Encounter Medications as of 01/09/2022  ?Medication Sig  ? amLODipine (NORVASC) 5 MG tablet Take 1 tablet (5 mg total) by mouth daily. For blood pressure  ? aspirin 81 MG chewable tablet Chew by mouth daily.  ? atenolol (TENORMIN) 25 MG tablet Take 12.5 mg by mouth daily.  ? atorvastatin (LIPITOR) 20 MG tablet Take 20 mg by mouth daily.  ? levETIRAcetam (  KEPPRA) 500 MG tablet Take 500 mg by mouth 2 (two) times daily.  ? levothyroxine (SYNTHROID) 50 MCG tablet Take 1 tablet (50 mcg total) by mouth daily. Take on an empty stomach  ? mirtazapine (REMERON) 7.5 MG tablet Take 7.5 mg by mouth at bedtime.  ? pantoprazole (PROTONIX) 40 MG tablet Take 40 mg by mouth 2 (two) times daily.  ? sennosides-docusate sodium (SENOKOT-S) 8.6-50 MG tablet Take 1 tablet by mouth 2 (two) times daily.  ? sertraline (ZOLOFT) 100 MG tablet Take 100 mg by mouth daily.  ? ticagrelor (BRILINTA) 90 MG TABS tablet Take 90 mg by mouth 2 (two) times daily.  ? traZODone (DESYREL) 50 MG tablet Take 50 mg by mouth at bedtime as needed for sleep.  ? ?No facility-administered encounter medications on file as of 01/09/2022.  ? ? ? ?SIGNIFICANT DIAGNOSTIC EXAMS ? ? ?LABS REVIEWED:  ? ?12-14-21; wbc 3.4;  hgb 8.9; hct 29.2; plt 723; glucose 80; bun 20; creat 1.36; k+ 5.2; na++ 136; ca 8.7  ?12-18-21: wbc 4.3; hgb 8.7; hct 28.8; plt 710; glucose 83; bun 20; creat 1.36;  k+ 4.1; na++ 141; ca 8.8  ? ?Review of Systems  ?Constitutional:  Negative for malaise/fatigue.  ?Respiratory:  Negative for cough and shortness of breath.   ?Cardiovascular:  Negative for chest pain, palpitations and leg swelling.  ?Gastrointestinal:  Negative for abdominal pain, constipation and heartburn.  ?Musculoskeletal:  Negative for back pain, joint pain and myalgias.  ?Skin: Negative.   ?Neurological:  Negative for dizziness.  ?Psychiatric/Behavioral:  The patient is not nervous/anxious.   ? ?Physical Exam ?Constitutional:   ?   General: She is not in acute distress. ?   Appearance: She is well-developed. She is not diaphoretic.  ?Neck:  ?   Thyroid: No thyromegaly.  ?Cardiovascular:  ?   Rate and Rhythm: Normal rate and regular rhythm.  ?   Pulses: Normal pulses.  ?   Heart sounds: Normal heart sounds.  ?Pulmonary:  ?   Effort: Pulmonary effort is normal. No respiratory distress.  ?   Breath sounds: Normal breath sounds.  ?Abdominal:  ?   General: Bowel sounds are normal. There is no distension.  ?   Palpations: Abdomen is soft.  ?   Tenderness: There is no abdominal tenderness.  ?Musculoskeletal:  ?   Cervical back: Neck supple.  ?   Right lower leg: No edema.  ?   Left lower leg: No edema.  ?   Comments: Is able to move all extremities   ?Lymphadenopathy:  ?   Cervical: No cervical adenopathy.  ?Skin: ?   General: Skin is warm and dry.  ?Neurological:  ?   Mental Status: She is alert. Mental status is at baseline.  ?Psychiatric:     ?   Mood and Affect: Mood normal.  ? ? ? ?ASSESSMENT/ PLAN: ? ?TODAY ? ?History of CVA; is neurologically stable will continue asa 81 mg daily bilinta 90 mg twice daily  ? ?2. Neurocognitive deficits: will continue ST as directed ? ?3. 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 ?4. Chronic kidney disease (CKD) stage 4 (severe) bun 20; creat 1.36 ? ?5. Benign essential; HTN: b/p 124/68 will continue norvasc 5 mg daily atenolol 12.5 mg daily  ? ?6. Acquired hypothyroidism: will continue synthroid 50 mcg daily  ? ?7. History of discoid lupus erythematous: will monitor  ? ?8.  GERD without  esophagitis: is stable will continue protonix 40 mg twice daily  ? ?9. Chronic constipation: will continue senna s twice daily  ? ?10. Major depression chronic recurrent: will continue zoloft 100 mg daily and has trazodone 50 mg nightly as needed through 01-21-22.  ? ?11. Anorexia: will continue remeron 7.5 mg nightly weight is 142 pounds ? ?12. Chronic anemia; hgb 8.7 will monitor she has received 3 iv doses of iron; and 1 unit prbc's.  ? ?13. Thrombocytosis: plt 710 is on brilinta and asa  ? ?14. Hyperlipidemia LDL Goal <70: will continue lipitor 20 mg daily  ? ?Will check cbc; cmp; tsh; iron studies.  ? ? ?Ok Edwards NP ?[facility]  ?call [phone]  ? ?

## 2022-01-10 ENCOUNTER — Encounter: Payer: Self-pay | Admitting: Internal Medicine

## 2022-01-10 ENCOUNTER — Non-Acute Institutional Stay (SKILLED_NURSING_FACILITY): Payer: Medicare Other | Admitting: Internal Medicine

## 2022-01-10 ENCOUNTER — Other Ambulatory Visit (HOSPITAL_COMMUNITY)
Admission: RE | Admit: 2022-01-10 | Discharge: 2022-01-10 | Disposition: A | Discharge status: Home / Self Care | Payer: Medicare Other | Source: Skilled Nursing Facility | Attending: Adult Health | Admitting: Adult Health

## 2022-01-10 DIAGNOSIS — Z8673 Personal history of transient ischemic attack (TIA), and cerebral infarction without residual deficits: Secondary | ICD-10-CM

## 2022-01-10 DIAGNOSIS — D509 Iron deficiency anemia, unspecified: Secondary | ICD-10-CM | POA: Diagnosis not present

## 2022-01-10 DIAGNOSIS — I10 Essential (primary) hypertension: Secondary | ICD-10-CM | POA: Insufficient documentation

## 2022-01-10 DIAGNOSIS — R569 Unspecified convulsions: Secondary | ICD-10-CM | POA: Diagnosis not present

## 2022-01-10 DIAGNOSIS — R29818 Other symptoms and signs involving the nervous system: Secondary | ICD-10-CM

## 2022-01-10 DIAGNOSIS — I69398 Other sequelae of cerebral infarction: Secondary | ICD-10-CM

## 2022-01-10 DIAGNOSIS — R5383 Other fatigue: Secondary | ICD-10-CM | POA: Insufficient documentation

## 2022-01-10 DIAGNOSIS — E039 Hypothyroidism, unspecified: Secondary | ICD-10-CM | POA: Diagnosis not present

## 2022-01-10 DIAGNOSIS — R4189 Other symptoms and signs involving cognitive functions and awareness: Secondary | ICD-10-CM

## 2022-01-10 LAB — IRON AND TIBC
Iron: 23 ug/dL — ABNORMAL LOW (ref 28–170)
Saturation Ratios: 17 % (ref 10.4–31.8)
TIBC: 135 ug/dL — ABNORMAL LOW (ref 250–450)
UIBC: 112 ug/dL

## 2022-01-10 LAB — COMPREHENSIVE METABOLIC PANEL
ALT: 10 U/L (ref 0–44)
AST: 19 U/L (ref 15–41)
Albumin: 2.3 g/dL — ABNORMAL LOW (ref 3.5–5.0)
Alkaline Phosphatase: 53 U/L (ref 38–126)
Anion gap: 7 (ref 5–15)
BUN: 19 mg/dL (ref 8–23)
CO2: 21 mmol/L — ABNORMAL LOW (ref 22–32)
Calcium: 8.5 mg/dL — ABNORMAL LOW (ref 8.9–10.3)
Chloride: 112 mmol/L — ABNORMAL HIGH (ref 98–111)
Creatinine, Ser: 1.28 mg/dL — ABNORMAL HIGH (ref 0.44–1.00)
GFR, Estimated: 46 mL/min — ABNORMAL LOW (ref >60)
Glucose, Bld: 76 mg/dL (ref 70–99)
Potassium: 3.6 mmol/L (ref 3.5–5.1)
Sodium: 140 mmol/L (ref 135–145)
Total Bilirubin: 0.4 mg/dL (ref 0.3–1.2)
Total Protein: 7 g/dL (ref 6.5–8.1)

## 2022-01-10 LAB — CBC
HCT: 25.8 % — ABNORMAL LOW (ref 36.0–46.0)
Hemoglobin: 8 g/dL — ABNORMAL LOW (ref 12.0–15.0)
MCH: 25.5 pg — ABNORMAL LOW (ref 26.0–34.0)
MCHC: 31 g/dL (ref 30.0–36.0)
MCV: 82.2 fL (ref 80.0–100.0)
Platelets: 602 10*3/uL — ABNORMAL HIGH (ref 150–400)
RBC: 3.14 MIL/uL — ABNORMAL LOW (ref 3.87–5.11)
RDW: 19.9 % — ABNORMAL HIGH (ref 11.5–15.5)
WBC: 4.5 10*3/uL (ref 4.0–10.5)
nRBC: 0 % (ref 0.0–0.2)

## 2022-01-10 LAB — LIPID PANEL
Cholesterol: 98 mg/dL (ref 0–200)
HDL: 20 mg/dL — ABNORMAL LOW (ref >40)
LDL Cholesterol: 48 mg/dL (ref 0–99)
Total CHOL/HDL Ratio: 4.9 RATIO
Triglycerides: 151 mg/dL — ABNORMAL HIGH (ref <150)
VLDL: 30 mg/dL (ref 0–40)

## 2022-01-10 LAB — T4, FREE: Free T4: 1 ng/dL (ref 0.61–1.12)

## 2022-01-10 LAB — TSH: TSH: 2.976 u[IU]/mL (ref 0.350–4.500)

## 2022-01-10 NOTE — Assessment & Plan Note (Addendum)
She cannot provide the date even the year correctly.  She gave the year as "3028" and the POTUS as [place].  She confabulates nonsensically about the reason for hospitalization. ?

## 2022-01-10 NOTE — Assessment & Plan Note (Signed)
BP controlled; no change in antihypertensive medications  

## 2022-01-10 NOTE — Patient Instructions (Signed)
See assessment and plan under each diagnosis in the problem list and acutely for this visit 

## 2022-01-10 NOTE — Assessment & Plan Note (Signed)
No seizure activity reported @ SNF. ?

## 2022-01-10 NOTE — Assessment & Plan Note (Signed)
Continue secondary prevention with aspirin, statin, and Brilinta. ?

## 2022-01-10 NOTE — Progress Notes (Signed)
? ?NURSING HOME LOCATION: [place] ?ROOM NUMBER: 157W ? ?CODE STATUS: Full code ? ?PCP: [name] MD ? ?This is a comprehensive admission note to this SNFperformed on this date less than 30 days from date of admission. ?Included are preadmission medical/surgical history; reconciled medication list; family history; social history and comprehensive review of systems.  ?Corrections and additions to the records were documented. Comprehensive physical exam was also performed. Additionally a clinical summary was entered for each active diagnosis pertinent to this admission in the Problem List to enhance continuity of care. ? ?HPI: She was hospitalized 3/30 - 01/08/2022 at [facility] and subsequently [facility], presenting with a syncopal episode which lasted several minutes.  CT of the head in the ED revealed multiple infarcts :small age-indeterminate cortical infarct, posterior occipital lobe infarct, old appearing inferior posterior left cerebellar infarct,& mild chronic small vessel ischemic changes.  The CVA was associated with cognitive-linguistic deficits and depression.  MoCA testing revealed a score of 11 out of 30. ?Zyprexa was weaned and sertraline and Remeron initiated to treat impaired mood and appetite.  Hypertension was treated with amlodipine and atenolol. ?Keppra was initiated for seizure activity on 4/2.  Keppra was temporarily stopped but she had a recurrent seizure on 4/10 prompting its reinitiation.  Prophylactic aspirin, statin, and Brilinta were continued post CVA. ?She was discharged to the SNF to continue rehab. ? ?Past medical and surgical history: Includes essential hypertension, dyslipidemia, hypothyroidism, GERD, RA, and depression. ?Surgeries include C-section. ? ?Social history: Nondrinker; non-smoker. ? ?Family history: Reviewed; extremely strong family history of hypertension. ?  ?Review of systems: Clinical  neurocognitive deficits made validity of responses questionable ;preventing ROS completion. Date given as "3028".  She states that she has had 2 strokes most recently  "in September of last year".  When I asked her what year that was her response was "99".  When I asked why she had been admitted to the hospital she stated that she had gone to the hospital a year ago but has been "in and out since".  She stated that most recent admission was for "a little blue spot between both of my butt's and pneumonia". ? ? ?Physical exam:  ?Pertinent or positive findings: She was sitting in the chair with her head on the bedside table.  She can raise her head.  Speech is slightly garbled.  As noted she confabulates.  The anterior upper teeth almost appear fang like.  Chin hirsutism is present.  She exhibits a gallop type cadence.  Pedal pulses are decreased.  Limbs are atrophic.  Strength testing to opposition is fair and seemingly equal. ? ?General appearance: no acute distress, increased work of breathing is present.   ?Lymphatic: No lymphadenopathy about the head, neck, axilla. ?Eyes: No conjunctival inflammation or lid edema is present. There is no scleral icterus. ?Ears:  External ear exam shows no significant lesions or deformities.   ?Nose:  External nasal examination shows no deformity or inflammation. Nasal mucosa are pink and moist without lesions, exudates ?Neck:  No thyromegaly, masses, tenderness noted.    ?Heart:  No murmur, click, rub.  ?Lungs: without wheezes, rhonchi, rales, rubs. ?Abdomen: Bowel sounds are normal.  Abdomen is soft and nontender with no organomegaly, hernias, masses. ?GU: Deferred  ?Extremities:  No cyanosis, clubbing, edema. ?Neurologic exam:  Balance, Rhomberg, finger to nose testing could not be completed due to clinical state ?Skin: Warm & dry w/o tenting. ?No significant lesions or  rash. ? ?See clinical summary under each active problem in the Problem List with associated updated therapeutic  plan ?1. ?

## 2022-01-11 DIAGNOSIS — I69328 Other speech and language deficits following cerebral infarction: Secondary | ICD-10-CM | POA: Diagnosis not present

## 2022-01-11 DIAGNOSIS — R2681 Unsteadiness on feet: Secondary | ICD-10-CM | POA: Diagnosis not present

## 2022-01-11 DIAGNOSIS — M6281 Muscle weakness (generalized): Secondary | ICD-10-CM | POA: Diagnosis not present

## 2022-01-11 DIAGNOSIS — I69398 Other sequelae of cerebral infarction: Secondary | ICD-10-CM | POA: Diagnosis not present

## 2022-01-11 DIAGNOSIS — R279 Unspecified lack of coordination: Secondary | ICD-10-CM | POA: Diagnosis not present

## 2022-01-11 DIAGNOSIS — I69321 Dysphasia following cerebral infarction: Secondary | ICD-10-CM | POA: Diagnosis not present

## 2022-01-13 DIAGNOSIS — I69328 Other speech and language deficits following cerebral infarction: Secondary | ICD-10-CM | POA: Diagnosis not present

## 2022-01-13 DIAGNOSIS — I69398 Other sequelae of cerebral infarction: Secondary | ICD-10-CM | POA: Diagnosis not present

## 2022-01-13 DIAGNOSIS — R2681 Unsteadiness on feet: Secondary | ICD-10-CM | POA: Diagnosis not present

## 2022-01-13 DIAGNOSIS — I69321 Dysphasia following cerebral infarction: Secondary | ICD-10-CM | POA: Diagnosis not present

## 2022-01-13 DIAGNOSIS — M6281 Muscle weakness (generalized): Secondary | ICD-10-CM | POA: Diagnosis not present

## 2022-01-13 DIAGNOSIS — R279 Unspecified lack of coordination: Secondary | ICD-10-CM | POA: Diagnosis not present

## 2022-01-14 DIAGNOSIS — I69328 Other speech and language deficits following cerebral infarction: Secondary | ICD-10-CM | POA: Diagnosis not present

## 2022-01-14 DIAGNOSIS — R279 Unspecified lack of coordination: Secondary | ICD-10-CM | POA: Diagnosis not present

## 2022-01-14 DIAGNOSIS — I69398 Other sequelae of cerebral infarction: Secondary | ICD-10-CM | POA: Diagnosis not present

## 2022-01-14 DIAGNOSIS — R2681 Unsteadiness on feet: Secondary | ICD-10-CM | POA: Diagnosis not present

## 2022-01-14 DIAGNOSIS — M6281 Muscle weakness (generalized): Secondary | ICD-10-CM | POA: Diagnosis not present

## 2022-01-14 DIAGNOSIS — I69321 Dysphasia following cerebral infarction: Secondary | ICD-10-CM | POA: Diagnosis not present

## 2022-01-15 ENCOUNTER — Encounter: Payer: Self-pay | Admitting: Adult Health

## 2022-01-15 ENCOUNTER — Non-Acute Institutional Stay (SKILLED_NURSING_FACILITY): Payer: Medicare Other | Admitting: Adult Health

## 2022-01-15 DIAGNOSIS — M6281 Muscle weakness (generalized): Secondary | ICD-10-CM | POA: Diagnosis not present

## 2022-01-15 DIAGNOSIS — R634 Abnormal weight loss: Secondary | ICD-10-CM | POA: Insufficient documentation

## 2022-01-15 DIAGNOSIS — I69321 Dysphasia following cerebral infarction: Secondary | ICD-10-CM | POA: Diagnosis not present

## 2022-01-15 DIAGNOSIS — R630 Anorexia: Secondary | ICD-10-CM | POA: Diagnosis not present

## 2022-01-15 DIAGNOSIS — R279 Unspecified lack of coordination: Secondary | ICD-10-CM | POA: Diagnosis not present

## 2022-01-15 DIAGNOSIS — I69328 Other speech and language deficits following cerebral infarction: Secondary | ICD-10-CM | POA: Diagnosis not present

## 2022-01-15 DIAGNOSIS — I69398 Other sequelae of cerebral infarction: Secondary | ICD-10-CM | POA: Diagnosis not present

## 2022-01-15 DIAGNOSIS — R2681 Unsteadiness on feet: Secondary | ICD-10-CM | POA: Diagnosis not present

## 2022-01-15 NOTE — Progress Notes (Signed)
?Location:  [place] ?Nursing Home Room Number: 157-W ?Place of Service:  SNF (31) ? ? ?CODE STATUS: Full Code  ? ?No Known Allergies ? ?Chief Complaint  ?Patient presents with  ? Acute Visit  ?  Poor appetite   ? ? ?HPI: ? ?She is losing weight: on 01-08-22: 142.2 pounds; current weight 139.2 pounds. She states that the food "won't leave her mouth" and that food "gets bigger in her mouth". She is being followed by speech therapy. There are no indications of aspiration present. She is taking ensure daily; her albumin is 2.3.  ? ?Past Medical History:  ?Diagnosis Date  ? Arthritis   ? Rheumatoid  ? Hypertension   ? ? ?Past Surgical History:  ?Procedure Laterality Date  ? CESAREAN SECTION    ? ? ?Social History  ? ?Socioeconomic History  ? Marital status: Single  ?  Spouse name: Not on file  ? Number of children: Not on file  ? Years of education: Not on file  ? Highest education level: Not on file  ?Occupational History  ? Not on file  ?Tobacco Use  ? Smoking status: Never  ? Smokeless tobacco: Never  ?Vaping Use  ? Vaping Use: Never used  ?Substance and Sexual Activity  ? Alcohol use: No  ?  Alcohol/week: 0.0 standard drinks  ? Drug use: No  ? Sexual activity: Not on file  ?Other Topics Concern  ? Not on file  ?Social History Narrative  ? Lives with sister.    ? ?Social Determinants of Health  ? ?Financial Resource Strain: Not on file  ?Food Insecurity: Not on file  ?Transportation Needs: Not on file  ?Physical Activity: Not on file  ?Stress: Not on file  ?Social Connections: Not on file  ?Intimate Partner Violence: Not on file  ? ?Family History  ?Problem Relation Age of Onset  ? Diabetes Mother   ? Hypertension Mother   ? Cancer Mother   ? Stroke Mother   ? Cancer Father   ?     Lung  ? Hypertension Sister   ? Hypertension Brother   ? Hypertension Sister   ? Hypertension Sister   ? Hypertension Sister   ? Hypertension Brother   ? Hypertension Sister   ? Hypertension Sister   ? ? ? ? ?VITAL SIGNS ?BP  130/70   Pulse 74   Temp 97.6 ?F (36.4 ?C)   Resp 20   Wt 139 lb 3.2 oz (63.1 kg)   SpO2 97%   BMI 22.47 kg/m?  ? ?Outpatient Encounter Medications as of 01/15/2022  ?Medication Sig  ? acetaminophen (TYLENOL) 325 MG tablet Take 650 mg by mouth every 6 (six) hours as needed.  ? amLODipine (NORVASC) 2.5 MG tablet Take 2.5 mg by mouth daily.  ? aspirin 81 MG chewable tablet Chew by mouth daily.  ? atenolol (TENORMIN) 25 MG tablet Take 12.5 mg by mouth daily.  ? Ensure Plus (ENSURE PLUS) LIQD Take 237 mLs by mouth 2 (two) times daily between meals.  ? levETIRAcetam (KEPPRA) 500 MG tablet Take 500 mg by mouth 2 (two) times daily.  ? levothyroxine (SYNTHROID) 50 MCG tablet Take 1 tablet (50 mcg total) by mouth daily. Take on an empty stomach  ? mirtazapine (REMERON) 7.5 MG tablet Take 7.5 mg by mouth at bedtime.  ? omega-3 acid ethyl esters (LOVAZA) 1 g capsule Take 1 g by mouth 2 (two) times daily.  ? pantoprazole (PROTONIX) 40 MG tablet Take 40 mg  by mouth 2 (two) times daily.  ? sennosides-docusate sodium (SENOKOT-S) 8.6-50 MG tablet Take 1 tablet by mouth 2 (two) times daily.  ? sertraline (ZOLOFT) 100 MG tablet Take 100 mg by mouth daily.  ? ticagrelor (BRILINTA) 90 MG TABS tablet Take 90 mg by mouth 2 (two) times daily.  ? traZODone (DESYREL) 50 MG tablet Take 50 mg by mouth at bedtime as needed for sleep.  ? UNABLE TO FIND Diet: dysphagia 3 diet with thin liquids  ? [DISCONTINUED] amLODipine (NORVASC) 5 MG tablet Take 1 tablet (5 mg total) by mouth daily. For blood pressure  ? [DISCONTINUED] atorvastatin (LIPITOR) 20 MG tablet Take 20 mg by mouth daily.  ? ?No facility-administered encounter medications on file as of 01/15/2022.  ? ? ? ?SIGNIFICANT DIAGNOSTIC EXAMS ? ?LABS REVIEWED:  ? ?12-14-21; wbc 3.4; hgb 8.9; hct 29.2; plt 723; glucose 80; bun 20; creat 1.36; k+ 5.2; na++ 136; ca 8.7  ?12-18-21: wbc 4.3; hgb 8.7; hct 28.8; plt 710; glucose 83; bun 20; creat 1.36;  k+ 4.1; na++ 141; ca 8.8  ? ?NO NEW LABS.   ? ?Review of Systems  ?Constitutional:  Negative for malaise/fatigue.  ?Respiratory:  Negative for cough and shortness of breath.   ?Cardiovascular:  Negative for chest pain, palpitations and leg swelling.  ?Gastrointestinal:  Negative for abdominal pain, constipation and heartburn.  ?Musculoskeletal:  Negative for back pain, joint pain and myalgias.  ?Skin: Negative.   ?Neurological:  Negative for dizziness.  ?Psychiatric/Behavioral:  The patient is not nervous/anxious.   ? ?Physical Exam ?Constitutional:   ?   General: She is not in acute distress. ?   Appearance: She is well-developed. She is not diaphoretic.  ?Neck:  ?   Thyroid: No thyromegaly.  ?Cardiovascular:  ?   Rate and Rhythm: Normal rate and regular rhythm.  ?   Pulses: Normal pulses.  ?   Heart sounds: Normal heart sounds.  ?Pulmonary:  ?   Effort: Pulmonary effort is normal. No respiratory distress.  ?   Breath sounds: Normal breath sounds.  ?Abdominal:  ?   General: Bowel sounds are normal. There is no distension.  ?   Palpations: Abdomen is soft.  ?   Tenderness: There is no abdominal tenderness.  ?Musculoskeletal:     ?   General: Normal range of motion.  ?   Cervical back: Neck supple.  ?   Right lower leg: No edema.  ?   Left lower leg: No edema.  ?Lymphadenopathy:  ?   Cervical: No cervical adenopathy.  ?Skin: ?   General: Skin is warm and dry.  ?Neurological:  ?   Mental Status: She is alert. Mental status is at baseline.  ?Psychiatric:     ?   Mood and Affect: Mood normal.  ? ? ? ? ?ASSESSMENT/ PLAN: ? ?TODAY ? ?Anorexia ?Weight loss ? ?Will stop lipitor ?Will begin ensure twice daily and will monitor her status.  ? ? ?Ok Edwards NP ?[facility]  ? call [phone]  ? ?

## 2022-01-16 DIAGNOSIS — I69398 Other sequelae of cerebral infarction: Secondary | ICD-10-CM | POA: Diagnosis not present

## 2022-01-16 DIAGNOSIS — M6281 Muscle weakness (generalized): Secondary | ICD-10-CM | POA: Diagnosis not present

## 2022-01-16 DIAGNOSIS — R279 Unspecified lack of coordination: Secondary | ICD-10-CM | POA: Diagnosis not present

## 2022-01-16 DIAGNOSIS — R2681 Unsteadiness on feet: Secondary | ICD-10-CM | POA: Diagnosis not present

## 2022-01-16 DIAGNOSIS — I69328 Other speech and language deficits following cerebral infarction: Secondary | ICD-10-CM | POA: Diagnosis not present

## 2022-01-16 DIAGNOSIS — I69321 Dysphasia following cerebral infarction: Secondary | ICD-10-CM | POA: Diagnosis not present

## 2022-01-17 DIAGNOSIS — M6281 Muscle weakness (generalized): Secondary | ICD-10-CM | POA: Diagnosis not present

## 2022-01-17 DIAGNOSIS — R279 Unspecified lack of coordination: Secondary | ICD-10-CM | POA: Diagnosis not present

## 2022-01-17 DIAGNOSIS — I69328 Other speech and language deficits following cerebral infarction: Secondary | ICD-10-CM | POA: Diagnosis not present

## 2022-01-17 DIAGNOSIS — I69398 Other sequelae of cerebral infarction: Secondary | ICD-10-CM | POA: Diagnosis not present

## 2022-01-17 DIAGNOSIS — R2681 Unsteadiness on feet: Secondary | ICD-10-CM | POA: Diagnosis not present

## 2022-01-17 DIAGNOSIS — I69321 Dysphasia following cerebral infarction: Secondary | ICD-10-CM | POA: Diagnosis not present

## 2022-01-18 DIAGNOSIS — I69398 Other sequelae of cerebral infarction: Secondary | ICD-10-CM | POA: Diagnosis not present

## 2022-01-18 DIAGNOSIS — R2681 Unsteadiness on feet: Secondary | ICD-10-CM | POA: Diagnosis not present

## 2022-01-18 DIAGNOSIS — I69321 Dysphasia following cerebral infarction: Secondary | ICD-10-CM | POA: Diagnosis not present

## 2022-01-18 DIAGNOSIS — R279 Unspecified lack of coordination: Secondary | ICD-10-CM | POA: Diagnosis not present

## 2022-01-18 DIAGNOSIS — M6281 Muscle weakness (generalized): Secondary | ICD-10-CM | POA: Diagnosis not present

## 2022-01-18 DIAGNOSIS — I69328 Other speech and language deficits following cerebral infarction: Secondary | ICD-10-CM | POA: Diagnosis not present

## 2022-01-20 DIAGNOSIS — I69328 Other speech and language deficits following cerebral infarction: Secondary | ICD-10-CM | POA: Diagnosis not present

## 2022-01-20 DIAGNOSIS — R2681 Unsteadiness on feet: Secondary | ICD-10-CM | POA: Diagnosis not present

## 2022-01-20 DIAGNOSIS — I69321 Dysphasia following cerebral infarction: Secondary | ICD-10-CM | POA: Diagnosis not present

## 2022-01-20 DIAGNOSIS — M6281 Muscle weakness (generalized): Secondary | ICD-10-CM | POA: Diagnosis not present

## 2022-01-20 DIAGNOSIS — R279 Unspecified lack of coordination: Secondary | ICD-10-CM | POA: Diagnosis not present

## 2022-01-20 DIAGNOSIS — I69398 Other sequelae of cerebral infarction: Secondary | ICD-10-CM | POA: Diagnosis not present

## 2022-01-21 ENCOUNTER — Ambulatory Visit: Payer: Medicare Other | Admitting: Family Medicine

## 2022-01-22 DIAGNOSIS — R2681 Unsteadiness on feet: Secondary | ICD-10-CM | POA: Diagnosis not present

## 2022-01-22 DIAGNOSIS — R279 Unspecified lack of coordination: Secondary | ICD-10-CM | POA: Diagnosis not present

## 2022-01-22 DIAGNOSIS — M6281 Muscle weakness (generalized): Secondary | ICD-10-CM | POA: Diagnosis not present

## 2022-01-22 DIAGNOSIS — I69321 Dysphasia following cerebral infarction: Secondary | ICD-10-CM | POA: Diagnosis not present

## 2022-01-22 DIAGNOSIS — I69328 Other speech and language deficits following cerebral infarction: Secondary | ICD-10-CM | POA: Diagnosis not present

## 2022-01-22 DIAGNOSIS — I69398 Other sequelae of cerebral infarction: Secondary | ICD-10-CM | POA: Diagnosis not present

## 2022-01-23 DIAGNOSIS — R279 Unspecified lack of coordination: Secondary | ICD-10-CM | POA: Diagnosis not present

## 2022-01-23 DIAGNOSIS — I69398 Other sequelae of cerebral infarction: Secondary | ICD-10-CM | POA: Diagnosis not present

## 2022-01-23 DIAGNOSIS — I69328 Other speech and language deficits following cerebral infarction: Secondary | ICD-10-CM | POA: Diagnosis not present

## 2022-01-23 DIAGNOSIS — I69321 Dysphasia following cerebral infarction: Secondary | ICD-10-CM | POA: Diagnosis not present

## 2022-01-23 DIAGNOSIS — M6281 Muscle weakness (generalized): Secondary | ICD-10-CM | POA: Diagnosis not present

## 2022-01-23 DIAGNOSIS — R2681 Unsteadiness on feet: Secondary | ICD-10-CM | POA: Diagnosis not present

## 2022-01-24 DIAGNOSIS — R2681 Unsteadiness on feet: Secondary | ICD-10-CM | POA: Diagnosis not present

## 2022-01-24 DIAGNOSIS — R279 Unspecified lack of coordination: Secondary | ICD-10-CM | POA: Diagnosis not present

## 2022-01-24 DIAGNOSIS — I69328 Other speech and language deficits following cerebral infarction: Secondary | ICD-10-CM | POA: Diagnosis not present

## 2022-01-24 DIAGNOSIS — M6281 Muscle weakness (generalized): Secondary | ICD-10-CM | POA: Diagnosis not present

## 2022-01-24 DIAGNOSIS — I69321 Dysphasia following cerebral infarction: Secondary | ICD-10-CM | POA: Diagnosis not present

## 2022-01-24 DIAGNOSIS — I69398 Other sequelae of cerebral infarction: Secondary | ICD-10-CM | POA: Diagnosis not present

## 2022-01-25 DIAGNOSIS — I69398 Other sequelae of cerebral infarction: Secondary | ICD-10-CM | POA: Diagnosis not present

## 2022-01-25 DIAGNOSIS — I69321 Dysphasia following cerebral infarction: Secondary | ICD-10-CM | POA: Diagnosis not present

## 2022-01-25 DIAGNOSIS — R279 Unspecified lack of coordination: Secondary | ICD-10-CM | POA: Diagnosis not present

## 2022-01-25 DIAGNOSIS — M6281 Muscle weakness (generalized): Secondary | ICD-10-CM | POA: Diagnosis not present

## 2022-01-25 DIAGNOSIS — I69328 Other speech and language deficits following cerebral infarction: Secondary | ICD-10-CM | POA: Diagnosis not present

## 2022-01-25 DIAGNOSIS — R2681 Unsteadiness on feet: Secondary | ICD-10-CM | POA: Diagnosis not present

## 2022-01-26 DIAGNOSIS — I69321 Dysphasia following cerebral infarction: Secondary | ICD-10-CM | POA: Diagnosis not present

## 2022-01-26 DIAGNOSIS — R279 Unspecified lack of coordination: Secondary | ICD-10-CM | POA: Diagnosis not present

## 2022-01-26 DIAGNOSIS — I69328 Other speech and language deficits following cerebral infarction: Secondary | ICD-10-CM | POA: Diagnosis not present

## 2022-01-26 DIAGNOSIS — I69398 Other sequelae of cerebral infarction: Secondary | ICD-10-CM | POA: Diagnosis not present

## 2022-01-26 DIAGNOSIS — R2681 Unsteadiness on feet: Secondary | ICD-10-CM | POA: Diagnosis not present

## 2022-01-26 DIAGNOSIS — M6281 Muscle weakness (generalized): Secondary | ICD-10-CM | POA: Diagnosis not present

## 2022-01-27 DIAGNOSIS — R279 Unspecified lack of coordination: Secondary | ICD-10-CM | POA: Diagnosis not present

## 2022-01-27 DIAGNOSIS — I69328 Other speech and language deficits following cerebral infarction: Secondary | ICD-10-CM | POA: Diagnosis not present

## 2022-01-27 DIAGNOSIS — I69398 Other sequelae of cerebral infarction: Secondary | ICD-10-CM | POA: Diagnosis not present

## 2022-01-27 DIAGNOSIS — M6281 Muscle weakness (generalized): Secondary | ICD-10-CM | POA: Diagnosis not present

## 2022-01-27 DIAGNOSIS — I10 Essential (primary) hypertension: Secondary | ICD-10-CM | POA: Diagnosis not present

## 2022-01-27 DIAGNOSIS — E039 Hypothyroidism, unspecified: Secondary | ICD-10-CM | POA: Diagnosis not present

## 2022-01-27 DIAGNOSIS — R2681 Unsteadiness on feet: Secondary | ICD-10-CM | POA: Diagnosis not present

## 2022-01-27 DIAGNOSIS — I69321 Dysphasia following cerebral infarction: Secondary | ICD-10-CM | POA: Diagnosis not present

## 2022-01-27 DIAGNOSIS — D649 Anemia, unspecified: Secondary | ICD-10-CM | POA: Diagnosis not present

## 2022-01-28 ENCOUNTER — Non-Acute Institutional Stay (SKILLED_NURSING_FACILITY): Payer: Medicare Other | Admitting: Adult Health

## 2022-01-28 ENCOUNTER — Encounter: Payer: Self-pay | Admitting: Adult Health

## 2022-01-28 ENCOUNTER — Other Ambulatory Visit: Payer: Self-pay | Admitting: Adult Health

## 2022-01-28 DIAGNOSIS — R634 Abnormal weight loss: Secondary | ICD-10-CM

## 2022-01-28 DIAGNOSIS — R630 Anorexia: Secondary | ICD-10-CM

## 2022-01-28 DIAGNOSIS — I69328 Other speech and language deficits following cerebral infarction: Secondary | ICD-10-CM | POA: Diagnosis not present

## 2022-01-28 DIAGNOSIS — R279 Unspecified lack of coordination: Secondary | ICD-10-CM | POA: Diagnosis not present

## 2022-01-28 DIAGNOSIS — R2681 Unsteadiness on feet: Secondary | ICD-10-CM | POA: Diagnosis not present

## 2022-01-28 DIAGNOSIS — I69321 Dysphasia following cerebral infarction: Secondary | ICD-10-CM | POA: Diagnosis not present

## 2022-01-28 DIAGNOSIS — I10 Essential (primary) hypertension: Secondary | ICD-10-CM | POA: Diagnosis not present

## 2022-01-28 DIAGNOSIS — M6281 Muscle weakness (generalized): Secondary | ICD-10-CM | POA: Diagnosis not present

## 2022-01-28 DIAGNOSIS — I69398 Other sequelae of cerebral infarction: Secondary | ICD-10-CM | POA: Diagnosis not present

## 2022-01-28 MED ORDER — DRONABINOL 2.5 MG PO CAPS: 2.5000 mg | ORAL_CAPSULE | Freq: Every day | ORAL | 0 refills | Status: AC

## 2022-01-28 NOTE — Progress Notes (Signed)
?Location:  [place] ?Nursing Home Room Number: 78 W ?Place of Service:  SNF (31) ?Provider:  Ok Edwards, NP ? ? ?CODE STATUS: FULL CODE ? ?No Known Allergies ? ?Chief Complaint  ?Patient presents with  ? Acute Visit  ?  Loss of appetite  ? ? ?HPI: ? ?She has had a loss of her appetite. She is losing weight from 142.2 pounds on 01-08-22 to her current weight of 133.4 pounds. She states that she is just not hungry; she is taking ensure most of the time. She denies any problems with swallowing or with food getting stuck in her throat. She is presently taking remeron 7.5 mg nightly without effect. She continues to have weakness.  ? ?Past Medical History:  ?Diagnosis Date  ? Arthritis   ? Rheumatoid  ? Hypertension   ? ? ?Past Surgical History:  ?Procedure Laterality Date  ? CESAREAN SECTION    ? ? ?Social History  ? ?Socioeconomic History  ? Marital status: Single  ?  Spouse name: Not on file  ? Number of children: Not on file  ? Years of education: Not on file  ? Highest education level: Not on file  ?Occupational History  ? Not on file  ?Tobacco Use  ? Smoking status: Never  ? Smokeless tobacco: Never  ?Vaping Use  ? Vaping Use: Never used  ?Substance and Sexual Activity  ? Alcohol use: No  ?  Alcohol/week: 0.0 standard drinks  ? Drug use: No  ? Sexual activity: Not on file  ?Other Topics Concern  ? Not on file  ?Social History Narrative  ? Lives with sister.    ? ?Social Determinants of Health  ? ?Financial Resource Strain: Not on file  ?Food Insecurity: Not on file  ?Transportation Needs: Not on file  ?Physical Activity: Not on file  ?Stress: Not on file  ?Social Connections: Not on file  ?Intimate Partner Violence: Not on file  ? ?Family History  ?Problem Relation Age of Onset  ? Diabetes Mother   ? Hypertension Mother   ? Cancer Mother   ? Stroke Mother   ? Cancer Father   ?     Lung  ? Hypertension Sister   ? Hypertension Brother   ? Hypertension Sister   ? Hypertension Sister   ? Hypertension  Sister   ? Hypertension Brother   ? Hypertension Sister   ? Hypertension Sister   ? ? ? ? ?VITAL SIGNS ?BP (!) 107/51   Pulse 61   Temp 98 ?F (36.7 ?C)   Ht '5\' 6"'$  (1.676 m)   Wt 133 lb 6.4 oz (60.5 kg)   BMI 21.53 kg/m?  ? ?Outpatient Encounter Medications as of 01/28/2022  ?Medication Sig  ? acetaminophen (TYLENOL) 325 MG tablet Take 650 mg by mouth every 6 (six) hours as needed.  ? amLODipine (NORVASC) 2.5 MG tablet Take 2.5 mg by mouth daily.  ? aspirin 81 MG chewable tablet Chew by mouth daily.  ? atenolol (TENORMIN) 25 MG tablet Take 12.5 mg by mouth daily.  ? Balsam Engineer, materials (VENELEX) OINT Apply topically. Special Instructions: Apply to sacrum, bilateral buttocks and coccyx qshift for prevention. ?Every Shift  ? dronabinol (MARINOL) 2.5 MG capsule Take 1 capsule (2.5 mg total) by mouth daily before lunch.  ? Ensure Plus (ENSURE PLUS) LIQD Take 237 mLs by mouth 2 (two) times daily between meals.  ? levETIRAcetam (KEPPRA) 500 MG tablet Take 500 mg by mouth 2 (two) times daily.  ?  levothyroxine (SYNTHROID) 50 MCG tablet Take 1 tablet (50 mcg total) by mouth daily. Take on an empty stomach  ? omega-3 acid ethyl esters (LOVAZA) 1 g capsule Take 1 g by mouth 2 (two) times daily.  ? pantoprazole (PROTONIX) 40 MG tablet Take 40 mg by mouth 2 (two) times daily.  ? sennosides-docusate sodium (SENOKOT-S) 8.6-50 MG tablet Take 1 tablet by mouth 2 (two) times daily.  ? sertraline (ZOLOFT) 100 MG tablet Take 100 mg by mouth daily.  ? ticagrelor (BRILINTA) 90 MG TABS tablet Take 90 mg by mouth 2 (two) times daily.  ? UNABLE TO FIND Diet: dysphagia 3 diet with thin liquids  ? ?No facility-administered encounter medications on file as of 01/28/2022.  ? ? ? ?SIGNIFICANT DIAGNOSTIC EXAMS ? ? ?LABS REVIEWED:  ? ?12-14-21; wbc 3.4; hgb 8.9; hct 29.2; plt 723; glucose 80; bun 20; creat 1.36; k+ 5.2; na++ 136; ca 8.7  ?12-18-21: wbc 4.3; hgb 8.7; hct 28.8; plt 710; glucose 83; bun 20; creat 1.36;  k+ 4.1; na++ 141; ca 8.8   ? ?TODAY ? ?01-10-22: wbc 4.5; hgb 8.0; hct 25.8; mcv 82.2 plt 602; glucose 76; bun 19; creat 1.28; k+ 3.6; na++ 140; ca 8.5; gfr 46; protein 7.0; albumin 2.3 tsh 2.976 free t4: 1.00; iron 23; tibc 135; chol 98; ldl 48; trig 151; hdl 20  ? ?Review of Systems  ?Constitutional:  Positive for malaise/fatigue.  ?     No appetite   ?Respiratory:  Negative for cough and shortness of breath.   ?Cardiovascular:  Negative for chest pain, palpitations and leg swelling.  ?Gastrointestinal:  Negative for abdominal pain, constipation and heartburn.  ?Musculoskeletal:  Negative for back pain, joint pain and myalgias.  ?Skin: Negative.   ?Neurological:  Negative for dizziness.  ?Psychiatric/Behavioral:  The patient is not nervous/anxious.   ? ?Physical Exam ?Constitutional:   ?   General: She is not in acute distress. ?   Appearance: She is well-developed. She is not diaphoretic.  ?Neck:  ?   Thyroid: No thyromegaly.  ?Cardiovascular:  ?   Rate and Rhythm: Normal rate and regular rhythm.  ?   Pulses: Normal pulses.  ?   Heart sounds: Normal heart sounds.  ?Pulmonary:  ?   Effort: Pulmonary effort is normal. No respiratory distress.  ?   Breath sounds: Normal breath sounds.  ?Abdominal:  ?   General: Bowel sounds are normal. There is no distension.  ?   Palpations: Abdomen is soft.  ?   Tenderness: There is no abdominal tenderness.  ?Musculoskeletal:     ?   General: Normal range of motion.  ?   Cervical back: Neck supple.  ?   Right lower leg: No edema.  ?   Left lower leg: No edema.  ?Lymphadenopathy:  ?   Cervical: No cervical adenopathy.  ?Skin: ?   General: Skin is warm and dry.  ?Neurological:  ?   Mental Status: She is alert. Mental status is at baseline.  ?Psychiatric:     ?   Mood and Affect: Mood normal.  ? ? ? ?ASSESSMENT/ PLAN: ? ?TODAY ? ?Benign essential HTN:  ?Abnormal weight loss ?Anorexia ? ?will stop norvasc at this time  ?Will stop remeron ?Will begin marinol 2.5 mg daily for 30 days  ?Her family has decided upon  no tube feeding.  ? ? ?Ok Edwards NP ?[facility]  ? call [phone]  ? ?

## 2022-01-29 DIAGNOSIS — I69328 Other speech and language deficits following cerebral infarction: Secondary | ICD-10-CM | POA: Diagnosis not present

## 2022-01-29 DIAGNOSIS — I69321 Dysphasia following cerebral infarction: Secondary | ICD-10-CM | POA: Diagnosis not present

## 2022-01-29 DIAGNOSIS — M6281 Muscle weakness (generalized): Secondary | ICD-10-CM | POA: Diagnosis not present

## 2022-01-29 DIAGNOSIS — R279 Unspecified lack of coordination: Secondary | ICD-10-CM | POA: Diagnosis not present

## 2022-01-29 DIAGNOSIS — I69398 Other sequelae of cerebral infarction: Secondary | ICD-10-CM | POA: Diagnosis not present

## 2022-01-29 DIAGNOSIS — R2681 Unsteadiness on feet: Secondary | ICD-10-CM | POA: Diagnosis not present

## 2022-01-30 DIAGNOSIS — I69398 Other sequelae of cerebral infarction: Secondary | ICD-10-CM | POA: Diagnosis not present

## 2022-01-30 DIAGNOSIS — I69321 Dysphasia following cerebral infarction: Secondary | ICD-10-CM | POA: Diagnosis not present

## 2022-01-30 DIAGNOSIS — R2681 Unsteadiness on feet: Secondary | ICD-10-CM | POA: Diagnosis not present

## 2022-01-30 DIAGNOSIS — I69328 Other speech and language deficits following cerebral infarction: Secondary | ICD-10-CM | POA: Diagnosis not present

## 2022-01-30 DIAGNOSIS — R279 Unspecified lack of coordination: Secondary | ICD-10-CM | POA: Diagnosis not present

## 2022-01-30 DIAGNOSIS — M6281 Muscle weakness (generalized): Secondary | ICD-10-CM | POA: Diagnosis not present

## 2022-01-31 DIAGNOSIS — R2681 Unsteadiness on feet: Secondary | ICD-10-CM | POA: Diagnosis not present

## 2022-01-31 DIAGNOSIS — I69328 Other speech and language deficits following cerebral infarction: Secondary | ICD-10-CM | POA: Diagnosis not present

## 2022-01-31 DIAGNOSIS — R279 Unspecified lack of coordination: Secondary | ICD-10-CM | POA: Diagnosis not present

## 2022-01-31 DIAGNOSIS — I69398 Other sequelae of cerebral infarction: Secondary | ICD-10-CM | POA: Diagnosis not present

## 2022-01-31 DIAGNOSIS — M6281 Muscle weakness (generalized): Secondary | ICD-10-CM | POA: Diagnosis not present

## 2022-01-31 DIAGNOSIS — I69321 Dysphasia following cerebral infarction: Secondary | ICD-10-CM | POA: Diagnosis not present

## 2022-02-01 DIAGNOSIS — I69321 Dysphasia following cerebral infarction: Secondary | ICD-10-CM | POA: Diagnosis not present

## 2022-02-01 DIAGNOSIS — I69398 Other sequelae of cerebral infarction: Secondary | ICD-10-CM | POA: Diagnosis not present

## 2022-02-01 DIAGNOSIS — M6281 Muscle weakness (generalized): Secondary | ICD-10-CM | POA: Diagnosis not present

## 2022-02-01 DIAGNOSIS — I69328 Other speech and language deficits following cerebral infarction: Secondary | ICD-10-CM | POA: Diagnosis not present

## 2022-02-01 DIAGNOSIS — R2681 Unsteadiness on feet: Secondary | ICD-10-CM | POA: Diagnosis not present

## 2022-02-01 DIAGNOSIS — R279 Unspecified lack of coordination: Secondary | ICD-10-CM | POA: Diagnosis not present

## 2022-02-02 DIAGNOSIS — M6281 Muscle weakness (generalized): Secondary | ICD-10-CM | POA: Diagnosis not present

## 2022-02-02 DIAGNOSIS — I69398 Other sequelae of cerebral infarction: Secondary | ICD-10-CM | POA: Diagnosis not present

## 2022-02-02 DIAGNOSIS — R279 Unspecified lack of coordination: Secondary | ICD-10-CM | POA: Diagnosis not present

## 2022-02-02 DIAGNOSIS — I69328 Other speech and language deficits following cerebral infarction: Secondary | ICD-10-CM | POA: Diagnosis not present

## 2022-02-02 DIAGNOSIS — R2681 Unsteadiness on feet: Secondary | ICD-10-CM | POA: Diagnosis not present

## 2022-02-02 DIAGNOSIS — I69321 Dysphasia following cerebral infarction: Secondary | ICD-10-CM | POA: Diagnosis not present

## 2022-02-05 ENCOUNTER — Encounter: Payer: Self-pay | Admitting: Family Medicine

## 2022-02-05 DIAGNOSIS — I69328 Other speech and language deficits following cerebral infarction: Secondary | ICD-10-CM | POA: Diagnosis not present

## 2022-02-05 DIAGNOSIS — M6281 Muscle weakness (generalized): Secondary | ICD-10-CM | POA: Diagnosis not present

## 2022-02-05 DIAGNOSIS — R279 Unspecified lack of coordination: Secondary | ICD-10-CM | POA: Diagnosis not present

## 2022-02-05 DIAGNOSIS — I69398 Other sequelae of cerebral infarction: Secondary | ICD-10-CM | POA: Diagnosis not present

## 2022-02-05 DIAGNOSIS — R2681 Unsteadiness on feet: Secondary | ICD-10-CM | POA: Diagnosis not present

## 2022-02-05 DIAGNOSIS — I69321 Dysphasia following cerebral infarction: Secondary | ICD-10-CM | POA: Diagnosis not present

## 2022-02-06 DIAGNOSIS — I69321 Dysphasia following cerebral infarction: Secondary | ICD-10-CM | POA: Diagnosis not present

## 2022-02-06 DIAGNOSIS — R2681 Unsteadiness on feet: Secondary | ICD-10-CM | POA: Diagnosis not present

## 2022-02-06 DIAGNOSIS — I69328 Other speech and language deficits following cerebral infarction: Secondary | ICD-10-CM | POA: Diagnosis not present

## 2022-02-06 DIAGNOSIS — R279 Unspecified lack of coordination: Secondary | ICD-10-CM | POA: Diagnosis not present

## 2022-02-06 DIAGNOSIS — I69398 Other sequelae of cerebral infarction: Secondary | ICD-10-CM | POA: Diagnosis not present

## 2022-02-06 DIAGNOSIS — M6281 Muscle weakness (generalized): Secondary | ICD-10-CM | POA: Diagnosis not present

## 2022-02-07 DIAGNOSIS — R2681 Unsteadiness on feet: Secondary | ICD-10-CM | POA: Diagnosis not present

## 2022-02-07 DIAGNOSIS — I69328 Other speech and language deficits following cerebral infarction: Secondary | ICD-10-CM | POA: Diagnosis not present

## 2022-02-07 DIAGNOSIS — M6281 Muscle weakness (generalized): Secondary | ICD-10-CM | POA: Diagnosis not present

## 2022-02-07 DIAGNOSIS — I69398 Other sequelae of cerebral infarction: Secondary | ICD-10-CM | POA: Diagnosis not present

## 2022-02-07 DIAGNOSIS — I69321 Dysphasia following cerebral infarction: Secondary | ICD-10-CM | POA: Diagnosis not present

## 2022-02-07 DIAGNOSIS — R279 Unspecified lack of coordination: Secondary | ICD-10-CM | POA: Diagnosis not present

## 2022-02-08 DIAGNOSIS — R2681 Unsteadiness on feet: Secondary | ICD-10-CM | POA: Diagnosis not present

## 2022-02-08 DIAGNOSIS — R279 Unspecified lack of coordination: Secondary | ICD-10-CM | POA: Diagnosis not present

## 2022-02-08 DIAGNOSIS — I69398 Other sequelae of cerebral infarction: Secondary | ICD-10-CM | POA: Diagnosis not present

## 2022-02-08 DIAGNOSIS — M6281 Muscle weakness (generalized): Secondary | ICD-10-CM | POA: Diagnosis not present

## 2022-02-08 DIAGNOSIS — I69321 Dysphasia following cerebral infarction: Secondary | ICD-10-CM | POA: Diagnosis not present

## 2022-02-08 DIAGNOSIS — I69328 Other speech and language deficits following cerebral infarction: Secondary | ICD-10-CM | POA: Diagnosis not present

## 2022-02-09 DIAGNOSIS — I69321 Dysphasia following cerebral infarction: Secondary | ICD-10-CM | POA: Diagnosis not present

## 2022-02-09 DIAGNOSIS — I69398 Other sequelae of cerebral infarction: Secondary | ICD-10-CM | POA: Diagnosis not present

## 2022-02-09 DIAGNOSIS — R279 Unspecified lack of coordination: Secondary | ICD-10-CM | POA: Diagnosis not present

## 2022-02-09 DIAGNOSIS — R2681 Unsteadiness on feet: Secondary | ICD-10-CM | POA: Diagnosis not present

## 2022-02-09 DIAGNOSIS — I69328 Other speech and language deficits following cerebral infarction: Secondary | ICD-10-CM | POA: Diagnosis not present

## 2022-02-09 DIAGNOSIS — M6281 Muscle weakness (generalized): Secondary | ICD-10-CM | POA: Diagnosis not present

## 2022-02-12 DIAGNOSIS — R2681 Unsteadiness on feet: Secondary | ICD-10-CM | POA: Diagnosis not present

## 2022-02-12 DIAGNOSIS — I69398 Other sequelae of cerebral infarction: Secondary | ICD-10-CM | POA: Diagnosis not present

## 2022-02-12 DIAGNOSIS — M6281 Muscle weakness (generalized): Secondary | ICD-10-CM | POA: Diagnosis not present

## 2022-02-12 DIAGNOSIS — R279 Unspecified lack of coordination: Secondary | ICD-10-CM | POA: Diagnosis not present

## 2022-02-12 DIAGNOSIS — I69321 Dysphasia following cerebral infarction: Secondary | ICD-10-CM | POA: Diagnosis not present

## 2022-02-12 DIAGNOSIS — I69328 Other speech and language deficits following cerebral infarction: Secondary | ICD-10-CM | POA: Diagnosis not present

## 2022-02-13 ENCOUNTER — Encounter: Payer: Self-pay | Admitting: Adult Health

## 2022-02-13 NOTE — Progress Notes (Signed)
Location:  [place] Room Number: 157-W Place of Service:  SNF (31)   CODE STATUS: Full Code   No Known Allergies  Chief Complaint  Patient presents with  . Medical Management of Chronic Issues    Routine visit. Discuss need for hep c screening, shingrix, colonoscopy, mammogram, PCV and DEXA or post pone if patient refuses.     HPI:    Past Medical History:  Diagnosis Date  . Arthritis    Rheumatoid  . Hypertension     Past Surgical History:  Procedure Laterality Date  . CESAREAN SECTION      Social History   Socioeconomic History  . Marital status: Single    Spouse name: Not on file  . Number of children: Not on file  . Years of education: Not on file  . Highest education level: Not on file  Occupational History  . Not on file  Tobacco Use  . Smoking status: Never  . Smokeless tobacco: Never  Vaping Use  . Vaping Use: Never used  Substance and Sexual Activity  . Alcohol use: No    Alcohol/week: 0.0 standard drinks  . Drug use: No  . Sexual activity: Not on file  Other Topics Concern  . Not on file  Social History Narrative   Lives with sister.     Social Determinants of Health   Financial Resource Strain: Not on file  Food Insecurity: Not on file  Transportation Needs: Not on file  Physical Activity: Not on file  Stress: Not on file  Social Connections: Not on file  Intimate Partner Violence: Not on file   Family History  Problem Relation Age of Onset  . Diabetes Mother   . Hypertension Mother   . Cancer Mother   . Stroke Mother   . Cancer Father        Lung  . Hypertension Sister   . Hypertension Brother   . Hypertension Sister   . Hypertension Sister   . Hypertension Sister   . Hypertension Brother   . Hypertension Sister   . Hypertension Sister       VITAL SIGNS BP (!) 106/58   Pulse 85   Temp 97.7 F (36.5 C)   Resp 20   Ht '5\' 6"'$  (1.676 m)   Wt 132 lb 6.4 oz (60.1 kg)   SpO2 98%   BMI 21.37  kg/m   Outpatient Encounter Medications as of 02/13/2022  Medication Sig  . acetaminophen (TYLENOL) 325 MG tablet Take 650 mg by mouth every 6 (six) hours as needed.  Marland Kitchen aspirin 81 MG chewable tablet Chew by mouth daily.  Marland Kitchen atenolol (TENORMIN) 25 MG tablet Take 12.5 mg by mouth daily.  Roseanne Kaufman Peru-Castor Oil (VENELEX) OINT Apply topically. Special Instructions: Apply to sacrum, bilateral buttocks and coccyx qshift for prevention. Every Shift  . dronabinol (MARINOL) 2.5 MG capsule Take 1 capsule (2.5 mg total) by mouth daily before lunch.  . Ensure Plus (ENSURE PLUS) LIQD Take 237 mLs by mouth 2 (two) times daily between meals.  . levETIRAcetam (KEPPRA) 750 MG tablet Take 750 mg by mouth 2 (two) times daily.  Marland Kitchen levothyroxine (SYNTHROID) 50 MCG tablet Take 1 tablet (50 mcg total) by mouth daily. Take on an empty stomach  . omega-3 acid ethyl esters (LOVAZA) 1 g capsule Take 1 g by mouth 2 (two) times daily.  . ondansetron (ZOFRAN-ODT) 4 MG disintegrating tablet Take 4 mg by mouth every 8 (eight) hours as needed  for nausea or vomiting.  . pantoprazole (PROTONIX) 40 MG tablet Take 40 mg by mouth 2 (two) times daily.  . sennosides-docusate sodium (SENOKOT-S) 8.6-50 MG tablet Take 1 tablet by mouth 2 (two) times daily.  . sertraline (ZOLOFT) 100 MG tablet Take 100 mg by mouth daily.  . ticagrelor (BRILINTA) 90 MG TABS tablet Take 90 mg by mouth 2 (two) times daily.  Marland Kitchen UNABLE TO FIND Diet: dysphagia 3 diet with thin liquids  . [DISCONTINUED] amLODipine (NORVASC) 2.5 MG tablet Take 2.5 mg by mouth daily.  . [DISCONTINUED] levETIRAcetam (KEPPRA) 500 MG tablet Take 500 mg by mouth 2 (two) times daily.   No facility-administered encounter medications on file as of 02/13/2022.     SIGNIFICANT DIAGNOSTIC EXAMS       ASSESSMENT/ PLAN:     Ok Edwards NP [facility]  Contact [phone] Monday through Friday 8am- 5pm  After hours call [phone]   This encounter was  created in error - please disregard.

## 2022-02-14 ENCOUNTER — Other Ambulatory Visit: Payer: Self-pay

## 2022-02-14 ENCOUNTER — Non-Acute Institutional Stay (SKILLED_NURSING_FACILITY): Payer: Medicare Other | Admitting: Adult Health

## 2022-02-14 ENCOUNTER — Telehealth: Payer: Self-pay

## 2022-02-14 ENCOUNTER — Ambulatory Visit (INDEPENDENT_AMBULATORY_CARE_PROVIDER_SITE_OTHER): Payer: Medicare Other | Admitting: Internal Medicine

## 2022-02-14 ENCOUNTER — Encounter: Payer: Self-pay | Admitting: Adult Health

## 2022-02-14 ENCOUNTER — Encounter: Payer: Self-pay | Admitting: Internal Medicine

## 2022-02-14 VITALS — BP 120/60 | HR 60 | Temp 97.7°F | Ht 66.0 in | Wt 130.8 lb

## 2022-02-14 DIAGNOSIS — I69398 Other sequelae of cerebral infarction: Secondary | ICD-10-CM

## 2022-02-14 DIAGNOSIS — R634 Abnormal weight loss: Secondary | ICD-10-CM | POA: Diagnosis not present

## 2022-02-14 DIAGNOSIS — N184 Chronic kidney disease, stage 4 (severe): Secondary | ICD-10-CM

## 2022-02-14 DIAGNOSIS — R279 Unspecified lack of coordination: Secondary | ICD-10-CM | POA: Diagnosis not present

## 2022-02-14 DIAGNOSIS — R569 Unspecified convulsions: Secondary | ICD-10-CM | POA: Diagnosis not present

## 2022-02-14 DIAGNOSIS — R4189 Other symptoms and signs involving cognitive functions and awareness: Secondary | ICD-10-CM

## 2022-02-14 DIAGNOSIS — Z8673 Personal history of transient ischemic attack (TIA), and cerebral infarction without residual deficits: Secondary | ICD-10-CM

## 2022-02-14 DIAGNOSIS — R112 Nausea with vomiting, unspecified: Secondary | ICD-10-CM

## 2022-02-14 DIAGNOSIS — D509 Iron deficiency anemia, unspecified: Secondary | ICD-10-CM

## 2022-02-14 DIAGNOSIS — M6281 Muscle weakness (generalized): Secondary | ICD-10-CM | POA: Diagnosis not present

## 2022-02-14 DIAGNOSIS — R195 Other fecal abnormalities: Secondary | ICD-10-CM

## 2022-02-14 DIAGNOSIS — R29818 Other symptoms and signs involving the nervous system: Secondary | ICD-10-CM

## 2022-02-14 DIAGNOSIS — R2681 Unsteadiness on feet: Secondary | ICD-10-CM | POA: Diagnosis not present

## 2022-02-14 MED ORDER — PEG 3350-KCL-NA BICARB-NACL 420 G PO SOLR: 4000.0000 mL | ORAL | 0 refills | Status: DC

## 2022-02-14 NOTE — Progress Notes (Signed)
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 There are no reports of uncontrolled pain. Her weight is 132 pound. She is presently taking marinol for her appetite. No reports of seizure activity.   Past Medical History:  Diagnosis Date   Arthritis    Rheumatoid   Hypertension     Past Surgical History:  Procedure Laterality Date   CESAREAN SECTION      Social History   Socioeconomic History   Marital status: Single    Spouse name: Not on file   Number of children: Not on file   Years of education: Not on file   Highest education level: Not on file  Occupational History   Not on file  Tobacco Use   Smoking status: Never   Smokeless tobacco: Never  Vaping Use   Vaping Use: Never used  Substance and Sexual Activity   Alcohol use: No    Alcohol/week: 0.0 standard drinks   Drug use: No   Sexual activity: Not on file  Other Topics Concern   Not on file  Social History Narrative   Lives with sister.     Social Determinants of Health   Financial Resource Strain: Not on file  Food Insecurity: Not on file  Transportation Needs: Not on file  Physical Activity: Not on file  Stress: Not on file  Social Connections: Not on file  Intimate Partner Violence: Not on file   Family History  Problem Relation Age of Onset   Diabetes Mother    Hypertension Mother    Cancer Mother     Stroke Mother    Colon cancer Father    Hypertension Sister    Hypertension Sister    Hypertension Sister    Hypertension Sister    Hypertension Sister    Hypertension Sister    Hypertension Brother    Hypertension Brother       VITAL SIGNS BP (!) 106/58   Pulse 85   Temp 97.7 F (36.5 C)   Resp 20   Ht '5\' 6"'$  (1.676 m)   Wt 132 lb 6.4 oz (60.1 kg)   SpO2 98%   BMI 21.37 kg/m   Outpatient Encounter Medications as of 02/14/2022  Medication Sig   acetaminophen (TYLENOL) 325 MG tablet Take 650 mg by mouth every 6 (six) hours as needed.   aspirin 81 MG chewable tablet Chew by mouth daily.   atenolol (TENORMIN) 25 MG tablet Take 12.5 mg by mouth daily.   Balsam [place]-Castor Oil (VENELEX) OINT Apply topically. Special Instructions: Apply to sacrum, bilateral buttocks and coccyx qshift for prevention. Every Shift   dronabinol (MARINOL) 2.5 MG capsule Take 1 capsule (2.5 mg total) by mouth daily before lunch.   Ensure Plus (ENSURE PLUS) LIQD Take 237 mLs by mouth 2 (two) times daily between meals.  levETIRAcetam (KEPPRA) 750 MG tablet Take 750 mg by mouth 2 (two) times daily.   levothyroxine (SYNTHROID) 50 MCG tablet Take 1 tablet (50 mcg total) by mouth daily. Take on an empty stomach   omega-3 acid ethyl esters (LOVAZA) 1 g capsule Take 1 g by mouth 2 (two) times daily.   ondansetron (ZOFRAN-ODT) 4 MG disintegrating tablet Take 4 mg by mouth every 8 (eight) hours as needed for nausea or vomiting.   pantoprazole (PROTONIX) 40 MG tablet Take 40 mg by mouth 2 (two) times daily.   sennosides-docusate sodium (SENOKOT-S) 8.6-50 MG tablet Take 1 tablet by mouth 2 (two) times daily.   sertraline (ZOLOFT) 100 MG tablet Take 100 mg by mouth daily.   ticagrelor (BRILINTA) 90 MG TABS tablet Take 90 mg by mouth 2 (two) times daily.   UNABLE TO FIND Diet: Regular diet with thin liquids   No facility-administered encounter medications on file as of 02/14/2022.     SIGNIFICANT DIAGNOSTIC  EXAMS  LABS REVIEWED:   12-14-21; wbc 3.4; hgb 8.9; hct 29.2; plt 723; glucose 80; bun 20; creat 1.36; k+ 5.2; na++ 136; ca 8.7  12-18-21: wbc 4.3; hgb 8.7; hct 28.8; plt 710; glucose 83; bun 20; creat 1.36;  k+ 4.1; na++ 141; ca 8.8   Review of Systems  Constitutional:  Negative for malaise/fatigue.  Respiratory:  Negative for cough and shortness of breath.   Cardiovascular:  Negative for chest pain, palpitations and leg swelling.  Gastrointestinal:  Negative for abdominal pain, constipation and heartburn.  Musculoskeletal:  Negative for back pain, joint pain and myalgias.  Skin: Negative.   Neurological:  Negative for dizziness.  Psychiatric/Behavioral:  The patient is not nervous/anxious.    Physical Exam Constitutional:      General: She is not in acute distress.    Appearance: She is well-developed. She is not diaphoretic.  Neck:     Thyroid: No thyromegaly.  Cardiovascular:     Rate and Rhythm: Normal rate and regular rhythm.     Pulses: Normal pulses.     Heart sounds: Normal heart sounds.  Pulmonary:     Effort: Pulmonary effort is normal. No respiratory distress.     Breath sounds: Normal breath sounds.  Abdominal:     General: Bowel sounds are normal. There is no distension.     Palpations: Abdomen is soft.     Tenderness: There is no abdominal tenderness.  Musculoskeletal:        General: Normal range of motion.     Cervical back: Neck supple.     Right lower leg: No edema.     Left lower leg: No edema.  Lymphadenopathy:     Cervical: No cervical adenopathy.  Skin:    General: Skin is warm and dry.  Neurological:     Mental Status: She is alert. Mental status is at baseline.  Psychiatric:        Mood and Affect: Mood normal.    ASSESSMENT/ PLAN:  TODAY  History of CVA: is neurologically stable will continue asa 81 mg daily bilinta 90  mg twice daily   2. Neurocognitive deficits: will continue to monitor  3. Seizure as late effect cerebrovascular  accident (CVA): no further seizure activity will continue keppra 500 mg twice daily   4. Chronic kidney disease (CKD) stage 4 (severe) bun 20; creat 1.36    PREVIOUS   5. Benign essential; HTN: b/p 124/68 will continue norvasc 5 mg daily atenolol 12.5 mg daily  6. Acquired hypothyroidism: will continue synthroid 50 mcg daily   7. History of discoid lupus erythematous: will monitor   8.  GERD without esophagitis: is stable will continue protonix 40 mg twice daily   9. Chronic constipation: will continue senna s twice daily   10. Major depression chronic recurrent: will continue zoloft 100 mg daily and has trazodone 50 mg nightly as needed through 01-21-22.   11. Anorexia: will continue remeron 7.5 mg nightly weight is 142 pounds  12. Chronic anemia; hgb 8.7 will monitor she has received 3 iv doses of iron; and 1 unit prbc's.   13. Thrombocytosis: plt 710 is on brilinta and asa   14. Hyperlipidemia LDL Goal <70: will continue lipitor 20 mg daily     Will check hgb/hct cmp; iron studies.   Ok Edwards NP [facility]  call [phone]

## 2022-02-14 NOTE — Telephone Encounter (Signed)
[facility], spoke to nurse Jocelyn Lamer, TCS/EGD scheduled for 02/21/22 at 2:00pm. Advised her pt will need to hold Brilinta for 5 days before procedure. Orders entered. Rx for prep sent to pharmacy.  PA for TCS/EGD submitted via [facility] website. PA# [id number], valid 02/21/22-05/22/22.

## 2022-02-14 NOTE — Progress Notes (Signed)
Primary Care Physician:  [name], MD Primary Gastroenterologist:  Dr. Abbey Chatters  Chief Complaint  Patient presents with   New Patient (Initial Visit)    Rebekah [name], heme pos stools, decreased intake    HPI:   Rebekah [name] is a 66 y.o. female who presents to clinic today by referral from her geriatrician Ok Edwards for evaluation.  Numerous GI complaints for me today.  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  Complains of weight loss of over 10 pounds.  Poor appetite.  "Just does not feel hungry ever."  Also with nausea and vomiting.  Typically vomits after every meal small amount.  No hematemesis.  No coffee-ground emesis.  States she has a history of ulcer many years ago.  Also with iron deficiency anemia status posttransfusion of 1 unit PRBCs as well as 3 doses of IV iron.  Heme positive stool.  No previous colonoscopy.  No previous upper endoscopy.  States her father died of colon cancer.  Has been trialed on Remeron and Marinol for anorexia.  Currently on pantoprazole twice daily for chronic GERD which is well controlled.  Senna as needed for constipation.  Past Medical History:  Diagnosis Date   Arthritis    Rheumatoid   Hypertension     Past Surgical History:  Procedure Laterality Date   CESAREAN SECTION      Current Outpatient Medications  Medication Sig Dispense Refill   acetaminophen (TYLENOL) 325 MG tablet Take 650 mg by mouth every 6 (six) hours as needed.     aspirin 81 MG chewable tablet Chew by mouth daily.     atenolol (TENORMIN) 25 MG tablet Take 12.5 mg by mouth daily.     Balsam [place]-Castor Oil (VENELEX) OINT Apply topically. Special Instructions: Apply to sacrum, bilateral buttocks and coccyx qshift for prevention. Every Shift     Ensure Plus (ENSURE PLUS) LIQD Take 237 mLs by mouth 2 (two) times daily between meals.     levETIRAcetam (KEPPRA) 750 MG tablet  Take 750 mg by mouth 2 (two) times daily.     omega-3 acid ethyl esters (LOVAZA) 1 g capsule Take 1 g by mouth 2 (two) times daily.     pantoprazole (PROTONIX) 40 MG tablet Take 40 mg by mouth 2 (two) times daily.     sennosides-docusate sodium (SENOKOT-S) 8.6-50 MG tablet Take 1 tablet by mouth 2 (two) times daily.     sertraline (ZOLOFT) 100 MG tablet Take 100 mg by mouth daily.     ticagrelor (BRILINTA) 90 MG TABS tablet Take 90 mg by mouth 2 (two) times daily.     dronabinol (MARINOL) 2.5 MG capsule Take 1 capsule (2.5 mg total) by mouth daily before lunch. 30 capsule 0   levothyroxine (SYNTHROID) 50 MCG tablet Take 1 tablet (50 mcg total) by mouth daily. Take on an empty stomach 90 tablet 0   ondansetron (ZOFRAN-ODT) 4 MG disintegrating tablet Take 4 mg by mouth every 8 (eight) hours as needed for nausea or vomiting.     UNABLE TO FIND Diet: dysphagia 3 diet with thin liquids     No current facility-administered medications for this visit.    Allergies as of 02/14/2022   (No Known Allergies)    Family History  Problem Relation Age of Onset   Diabetes Mother    Hypertension Mother    Cancer Mother    Stroke Mother    Cancer Father  Lung   Hypertension Sister    Hypertension Brother    Hypertension Sister    Hypertension Sister    Hypertension Sister    Hypertension Brother    Hypertension Sister    Hypertension Sister     Social History   Socioeconomic History   Marital status: Single    Spouse name: Not on file   Number of children: Not on file   Years of education: Not on file   Highest education level: Not on file  Occupational History   Not on file  Tobacco Use   Smoking status: Never   Smokeless tobacco: Never  Vaping Use   Vaping Use: Never used  Substance and Sexual Activity   Alcohol use: No    Alcohol/week: 0.0 standard drinks   Drug use: No   Sexual activity: Not on file  Other Topics Concern   Not on file  Social History Narrative    Lives with sister.     Social Determinants of Health   Financial Resource Strain: Not on file  Food Insecurity: Not on file  Transportation Needs: Not on file  Physical Activity: Not on file  Stress: Not on file  Social Connections: Not on file  Intimate Partner Violence: Not on file    Subjective: Review of Systems  Constitutional:  Positive for malaise/fatigue and weight loss. Negative for chills and fever.  HENT:  Negative for congestion and hearing loss.   Eyes:  Negative for blurred vision and double vision.  Respiratory:  Negative for cough and shortness of breath.   Cardiovascular:  Negative for chest pain and palpitations.  Gastrointestinal:  Positive for heartburn, nausea and vomiting. Negative for abdominal pain, blood in stool, constipation, diarrhea and melena.  Genitourinary:  Negative for dysuria and urgency.  Musculoskeletal:  Negative for joint pain and myalgias.  Skin:  Negative for itching and rash.  Neurological:  Negative for dizziness and headaches.  Psychiatric/Behavioral:  Negative for depression. The patient is not nervous/anxious.       Objective: BP 120/60   Pulse 60   Temp 97.7 F (36.5 C)   Ht '5\' 6"'$  (1.676 m)   Wt 130 lb 12.8 oz (59.3 kg)   BMI 21.11 kg/m  Physical Exam Constitutional:      Appearance: Normal appearance.  HENT:     Head: Normocephalic and atraumatic.  Eyes:     Extraocular Movements: Extraocular movements intact.     Conjunctiva/sclera: Conjunctivae normal.  Cardiovascular:     Rate and Rhythm: Normal rate and regular rhythm.  Pulmonary:     Effort: Pulmonary effort is normal.     Breath sounds: Normal breath sounds.  Abdominal:     General: Bowel sounds are normal.     Palpations: Abdomen is soft.  Musculoskeletal:        General: No swelling. Normal range of motion.     Cervical back: Normal range of motion and neck supple.  Skin:    General: Skin is warm and dry.     Coloration: Skin is not jaundiced.   Neurological:     General: No focal deficit present.     Mental Status: She is alert and oriented to person, place, and time.  Psychiatric:        Mood and Affect: Mood normal.        Behavior: Behavior normal.     Assessment: *Iron deficiency anemia *Nausea and vomiting *Weight loss *Heme positive stool *Family history of colon cancer in father  Plan: Will schedule for EGD to evaluate for peptic ulcer disease, esophagitis, gastritis, H. Pylori, duodenitis, or other. Will also evaluate for esophageal stricture, Schatzki's ring, esophageal web or other.   At the same time we will perform colonoscopy given her iron deficiency anemia and heme positive stool.  The risks including infection, bleed, or perforation as well as benefits, limitations, alternatives and imponderables have been reviewed with the patient. Potential for esophageal dilation, biopsy, etc. have also been reviewed.  Questions have been answered. All parties agreeable.  Continue on pantoprazole twice daily for now.  Avoid NSAIDs.  Further recommendations to follow.  Thank you Ok Edwards for the kind referral.  02/14/2022 9:24 AM   Disclaimer: This note was dictated with voice recognition software. Similar sounding words can inadvertently be transcribed and may not be corrected upon review.

## 2022-02-14 NOTE — Telephone Encounter (Signed)
[facility] to schedule TCS/EGD ASA 3 w/Dr. Abbey Chatters, call was transferred, then rang many times with no answer.

## 2022-02-14 NOTE — Patient Instructions (Signed)
We will schedule you for upper endoscopy and colonoscopy to further evaluate your poor appetite, weight loss, anemia, heme positive stool.  Continue on pantoprazole twice daily.  You will need to hold your Brilinta x5 days prior to procedure.  Further recommendations to follow.  It was very nice meeting both you today.  Dr. Abbey Chatters

## 2022-02-14 NOTE — Telephone Encounter (Signed)
Pre-op appt 02/19/22 at 2:15pm. Appt letter and procedure instructions faxed to [place].

## 2022-02-14 NOTE — H&P (View-Only) (Signed)
Primary Care Physician:  [name], MD Primary Gastroenterologist:  Dr. Abbey Chatters  Chief Complaint  Patient presents with   New Patient (Initial Visit)    Rebekah [name], heme pos stools, decreased intake    HPI:   Rebekah [name] is a 66 y.o. female who presents to clinic today by referral from her geriatrician Ok Edwards for evaluation.  Numerous GI complaints for me today.  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  Complains of weight loss of over 10 pounds.  Poor appetite.  "Just does not feel hungry ever."  Also with nausea and vomiting.  Typically vomits after every meal small amount.  No hematemesis.  No coffee-ground emesis.  States she has a history of ulcer many years ago.  Also with iron deficiency anemia status posttransfusion of 1 unit PRBCs as well as 3 doses of IV iron.  Heme positive stool.  No previous colonoscopy.  No previous upper endoscopy.  States her father died of colon cancer.  Has been trialed on Remeron and Marinol for anorexia.  Currently on pantoprazole twice daily for chronic GERD which is well controlled.  Senna as needed for constipation.  Past Medical History:  Diagnosis Date   Arthritis    Rheumatoid   Hypertension     Past Surgical History:  Procedure Laterality Date   CESAREAN SECTION      Current Outpatient Medications  Medication Sig Dispense Refill   acetaminophen (TYLENOL) 325 MG tablet Take 650 mg by mouth every 6 (six) hours as needed.     aspirin 81 MG chewable tablet Chew by mouth daily.     atenolol (TENORMIN) 25 MG tablet Take 12.5 mg by mouth daily.     Balsam [place]-Castor Oil (VENELEX) OINT Apply topically. Special Instructions: Apply to sacrum, bilateral buttocks and coccyx qshift for prevention. Every Shift     Ensure Plus (ENSURE PLUS) LIQD Take 237 mLs by mouth 2 (two) times daily between meals.     levETIRAcetam (KEPPRA) 750 MG tablet  Take 750 mg by mouth 2 (two) times daily.     omega-3 acid ethyl esters (LOVAZA) 1 g capsule Take 1 g by mouth 2 (two) times daily.     pantoprazole (PROTONIX) 40 MG tablet Take 40 mg by mouth 2 (two) times daily.     sennosides-docusate sodium (SENOKOT-S) 8.6-50 MG tablet Take 1 tablet by mouth 2 (two) times daily.     sertraline (ZOLOFT) 100 MG tablet Take 100 mg by mouth daily.     ticagrelor (BRILINTA) 90 MG TABS tablet Take 90 mg by mouth 2 (two) times daily.     dronabinol (MARINOL) 2.5 MG capsule Take 1 capsule (2.5 mg total) by mouth daily before lunch. 30 capsule 0   levothyroxine (SYNTHROID) 50 MCG tablet Take 1 tablet (50 mcg total) by mouth daily. Take on an empty stomach 90 tablet 0   ondansetron (ZOFRAN-ODT) 4 MG disintegrating tablet Take 4 mg by mouth every 8 (eight) hours as needed for nausea or vomiting.     UNABLE TO FIND Diet: dysphagia 3 diet with thin liquids     No current facility-administered medications for this visit.    Allergies as of 02/14/2022   (No Known Allergies)    Family History  Problem Relation Age of Onset   Diabetes Mother    Hypertension Mother    Cancer Mother    Stroke Mother    Cancer Father  Lung   Hypertension Sister    Hypertension Brother    Hypertension Sister    Hypertension Sister    Hypertension Sister    Hypertension Brother    Hypertension Sister    Hypertension Sister     Social History   Socioeconomic History   Marital status: Single    Spouse name: Not on file   Number of children: Not on file   Years of education: Not on file   Highest education level: Not on file  Occupational History   Not on file  Tobacco Use   Smoking status: Never   Smokeless tobacco: Never  Vaping Use   Vaping Use: Never used  Substance and Sexual Activity   Alcohol use: No    Alcohol/week: 0.0 standard drinks   Drug use: No   Sexual activity: Not on file  Other Topics Concern   Not on file  Social History Narrative    Lives with sister.     Social Determinants of Health   Financial Resource Strain: Not on file  Food Insecurity: Not on file  Transportation Needs: Not on file  Physical Activity: Not on file  Stress: Not on file  Social Connections: Not on file  Intimate Partner Violence: Not on file    Subjective: Review of Systems  Constitutional:  Positive for malaise/fatigue and weight loss. Negative for chills and fever.  HENT:  Negative for congestion and hearing loss.   Eyes:  Negative for blurred vision and double vision.  Respiratory:  Negative for cough and shortness of breath.   Cardiovascular:  Negative for chest pain and palpitations.  Gastrointestinal:  Positive for heartburn, nausea and vomiting. Negative for abdominal pain, blood in stool, constipation, diarrhea and melena.  Genitourinary:  Negative for dysuria and urgency.  Musculoskeletal:  Negative for joint pain and myalgias.  Skin:  Negative for itching and rash.  Neurological:  Negative for dizziness and headaches.  Psychiatric/Behavioral:  Negative for depression. The patient is not nervous/anxious.       Objective: BP 120/60   Pulse 60   Temp 97.7 F (36.5 C)   Ht '5\' 6"'$  (1.676 m)   Wt 130 lb 12.8 oz (59.3 kg)   BMI 21.11 kg/m  Physical Exam Constitutional:      Appearance: Normal appearance.  HENT:     Head: Normocephalic and atraumatic.  Eyes:     Extraocular Movements: Extraocular movements intact.     Conjunctiva/sclera: Conjunctivae normal.  Cardiovascular:     Rate and Rhythm: Normal rate and regular rhythm.  Pulmonary:     Effort: Pulmonary effort is normal.     Breath sounds: Normal breath sounds.  Abdominal:     General: Bowel sounds are normal.     Palpations: Abdomen is soft.  Musculoskeletal:        General: No swelling. Normal range of motion.     Cervical back: Normal range of motion and neck supple.  Skin:    General: Skin is warm and dry.     Coloration: Skin is not jaundiced.   Neurological:     General: No focal deficit present.     Mental Status: She is alert and oriented to person, place, and time.  Psychiatric:        Mood and Affect: Mood normal.        Behavior: Behavior normal.     Assessment: *Iron deficiency anemia *Nausea and vomiting *Weight loss *Heme positive stool *Family history of colon cancer in father  Plan: Will schedule for EGD to evaluate for peptic ulcer disease, esophagitis, gastritis, H. Pylori, duodenitis, or other. Will also evaluate for esophageal stricture, Schatzki's ring, esophageal web or other.   At the same time we will perform colonoscopy given her iron deficiency anemia and heme positive stool.  The risks including infection, bleed, or perforation as well as benefits, limitations, alternatives and imponderables have been reviewed with the patient. Potential for esophageal dilation, biopsy, etc. have also been reviewed.  Questions have been answered. All parties agreeable.  Continue on pantoprazole twice daily for now.  Avoid NSAIDs.  Further recommendations to follow.  Thank you Ok Edwards for the kind referral.  02/14/2022 9:24 AM   Disclaimer: This note was dictated with voice recognition software. Similar sounding words can inadvertently be transcribed and may not be corrected upon review.

## 2022-02-15 DIAGNOSIS — R2681 Unsteadiness on feet: Secondary | ICD-10-CM | POA: Diagnosis not present

## 2022-02-15 DIAGNOSIS — I69398 Other sequelae of cerebral infarction: Secondary | ICD-10-CM | POA: Diagnosis not present

## 2022-02-15 DIAGNOSIS — R279 Unspecified lack of coordination: Secondary | ICD-10-CM | POA: Diagnosis not present

## 2022-02-15 DIAGNOSIS — M6281 Muscle weakness (generalized): Secondary | ICD-10-CM | POA: Diagnosis not present

## 2022-02-15 NOTE — Telephone Encounter (Signed)
Received completed fax confirmation.

## 2022-02-15 NOTE — Telephone Encounter (Signed)
Received voicemail from Davie at [facility], asking if pt needs to hold Aspirin '81mg'$  prior to procedure. [phone].  Called Tammy back, informed her pt doesn't have to hold Aspirin prior to procedure.

## 2022-02-15 NOTE — Telephone Encounter (Signed)
Fax didn't go through x4 to [facility].  [facility], spoke to [place], she gave another fax# [phone]. Appt letter and procedure instructions sent.

## 2022-02-16 DIAGNOSIS — M6281 Muscle weakness (generalized): Secondary | ICD-10-CM | POA: Diagnosis not present

## 2022-02-16 DIAGNOSIS — R279 Unspecified lack of coordination: Secondary | ICD-10-CM | POA: Diagnosis not present

## 2022-02-16 DIAGNOSIS — I69398 Other sequelae of cerebral infarction: Secondary | ICD-10-CM | POA: Diagnosis not present

## 2022-02-16 DIAGNOSIS — R2681 Unsteadiness on feet: Secondary | ICD-10-CM | POA: Diagnosis not present

## 2022-02-18 ENCOUNTER — Non-Acute Institutional Stay (SKILLED_NURSING_FACILITY): Payer: Medicare Other | Admitting: Adult Health

## 2022-02-18 ENCOUNTER — Encounter: Payer: Self-pay | Admitting: Adult Health

## 2022-02-18 ENCOUNTER — Encounter (HOSPITAL_COMMUNITY): Payer: Self-pay

## 2022-02-18 ENCOUNTER — Other Ambulatory Visit (HOSPITAL_COMMUNITY)
Admission: RE | Admit: 2022-02-18 | Discharge: 2022-02-18 | Disposition: A | Discharge status: Home / Self Care | Payer: Medicare Other | Source: Skilled Nursing Facility | Attending: Adult Health | Admitting: Adult Health

## 2022-02-18 DIAGNOSIS — M6281 Muscle weakness (generalized): Secondary | ICD-10-CM | POA: Diagnosis not present

## 2022-02-18 DIAGNOSIS — E876 Hypokalemia: Secondary | ICD-10-CM | POA: Diagnosis not present

## 2022-02-18 DIAGNOSIS — D649 Anemia, unspecified: Secondary | ICD-10-CM

## 2022-02-18 DIAGNOSIS — I69398 Other sequelae of cerebral infarction: Secondary | ICD-10-CM | POA: Diagnosis not present

## 2022-02-18 DIAGNOSIS — R279 Unspecified lack of coordination: Secondary | ICD-10-CM | POA: Diagnosis not present

## 2022-02-18 DIAGNOSIS — M329 Systemic lupus erythematosus, unspecified: Secondary | ICD-10-CM | POA: Diagnosis not present

## 2022-02-18 DIAGNOSIS — R2681 Unsteadiness on feet: Secondary | ICD-10-CM | POA: Diagnosis not present

## 2022-02-18 LAB — COMPREHENSIVE METABOLIC PANEL
ALT: 7 U/L (ref 0–44)
AST: 15 U/L (ref 15–41)
Albumin: 1.9 g/dL — ABNORMAL LOW (ref 3.5–5.0)
Alkaline Phosphatase: 56 U/L (ref 38–126)
Anion gap: 4 — ABNORMAL LOW (ref 5–15)
BUN: 9 mg/dL (ref 8–23)
CO2: 23 mmol/L (ref 22–32)
Calcium: 7.6 mg/dL — ABNORMAL LOW (ref 8.9–10.3)
Chloride: 109 mmol/L (ref 98–111)
Creatinine, Ser: 0.8 mg/dL (ref 0.44–1.00)
GFR, Estimated: >60 mL/min (ref >60)
Glucose, Bld: 47 mg/dL — ABNORMAL LOW (ref 70–99)
Potassium: 3 mmol/L — ABNORMAL LOW (ref 3.5–5.1)
Sodium: 136 mmol/L (ref 135–145)
Total Bilirubin: 0.6 mg/dL (ref 0.3–1.2)
Total Protein: 5.8 g/dL — ABNORMAL LOW (ref 6.5–8.1)

## 2022-02-18 LAB — HEMOGLOBIN AND HEMATOCRIT, BLOOD
HCT: 24.7 % — ABNORMAL LOW (ref 36.0–46.0)
Hemoglobin: 7.7 g/dL — ABNORMAL LOW (ref 12.0–15.0)

## 2022-02-18 NOTE — Progress Notes (Unsigned)
Location:  [place] Room Number: S/157/W Place of Service:  SNF (31), Ok Edwards S.,NP  CODE STATUS: FULL  No Known Allergies  Chief Complaint  Patient presents with   Acute Visit    Patient is being see for a F/U to discuss lab results    HPI:    Past Medical History:  Diagnosis Date   Arthritis    Rheumatoid   Dysphagia    post CVA   Hypertension    Lupus (systemic lupus erythematosus) ([place])    Seizure ([place])    Stroke [facility])     Past Surgical History:  Procedure Laterality Date   CESAREAN SECTION      Social History   Socioeconomic History   Marital status: Single    Spouse name: Not on file   Number of children: Not on file   Years of education: Not on file   Highest education level: Not on file  Occupational History   Not on file  Tobacco Use   Smoking status: Never   Smokeless tobacco: Never  Vaping Use   Vaping Use: Never used  Substance and Sexual Activity   Alcohol use: No    Alcohol/week: 0.0 standard drinks   Drug use: No   Sexual activity: Not on file  Other Topics Concern   Not on file  Social History Narrative   Lives with sister.     Social Determinants of Health   Financial Resource Strain: Not on file  Food Insecurity: Not on file  Transportation Needs: Not on file  Physical Activity: Not on file  Stress: Not on file  Social Connections: Not on file  Intimate Partner Violence: Not on file   Family History  Problem Relation Age of Onset   Diabetes Mother    Hypertension Mother    Cancer Mother    Stroke Mother    Colon cancer Father    Hypertension Sister    Hypertension Sister    Hypertension Sister    Hypertension Sister    Hypertension Sister    Hypertension Sister    Hypertension Brother    Hypertension Brother       VITAL SIGNS BP 110/70   Pulse 75   Temp 98.5 F (36.9 C)   Resp (!) 22   Ht '5\' 6"'$  (1.676 m)   Wt 130 lb 3.2 oz (59.1 kg)   SpO2 98%   BMI 21.01 kg/m    Outpatient Encounter Medications as of 02/18/2022  Medication Sig   acetaminophen (TYLENOL) 325 MG tablet Take 650 mg by mouth every 6 (six) hours as needed.   aspirin 81 MG chewable tablet Chew by mouth daily.   atenolol (TENORMIN) 25 MG tablet Take 12.5 mg by mouth daily.   Balsam [place]-Castor Oil (VENELEX) OINT Apply topically. Special Instructions: Apply to sacrum, bilateral buttocks and coccyx qshift for prevention. Every Shift   dronabinol (MARINOL) 2.5 MG capsule Take 1 capsule (2.5 mg total) by mouth daily before lunch.   Ensure Plus (ENSURE PLUS) LIQD Take 237 mLs by mouth 2 (two) times daily between meals.   levETIRAcetam (KEPPRA) 750 MG tablet Take 750 mg by mouth 2 (two) times daily.   levothyroxine (SYNTHROID) 50 MCG tablet Take 1 tablet (50 mcg total) by mouth daily. Take on an empty stomach   omega-3 acid ethyl esters (LOVAZA) 1 g capsule Take 1 g by mouth 2 (two) times daily.   ondansetron (ZOFRAN-ODT) 4 MG disintegrating tablet Take 4 mg by mouth  every 8 (eight) hours as needed for nausea or vomiting.   pantoprazole (PROTONIX) 40 MG tablet Take 40 mg by mouth 2 (two) times daily.   polyethylene glycol-electrolytes (TRILYTE) 420 g solution Take 4,000 mLs by mouth as directed.   sennosides-docusate sodium (SENOKOT-S) 8.6-50 MG tablet Take 1 tablet by mouth 2 (two) times daily.   sertraline (ZOLOFT) 100 MG tablet Take 100 mg by mouth daily.   ticagrelor (BRILINTA) 90 MG TABS tablet Take 90 mg by mouth 2 (two) times daily.   UNABLE TO FIND Diet: Regular diet with thin liquids   No facility-administered encounter medications on file as of 02/18/2022.     SIGNIFICANT DIAGNOSTIC EXAMS   LABS REVIEWED:   12-14-21; wbc 3.4; hgb 8.9; hct 29.2; plt 723; glucose 80; bun 20; creat 1.36; k+ 5.2; na++ 136; ca 8.7  12-18-21: wbc 4.3; hgb 8.7; hct 28.8; plt 710; glucose 83; bun 20; creat 1.36;  k+ 4.1; na++ 141; ca 8.8   TODAY  01-10-22: wbc 4.5; hgb 8.0; hct 25.8; mcv 82.2 plt 603;  glucose 76; bun 19; creat 1.28; k+ 3.6; na++ 1140; ca 8.5; gfr 46; protein 7.0; albumin 2.3; chol 98; ldl 48; trig 151; hdl 20; tsh 2.976 free t4: 1.00; iron 23; tibc 135  Review of Systems  Constitutional:  Negative for malaise/fatigue.  Respiratory:  Negative for cough and shortness of breath.   Cardiovascular:  Negative for chest pain, palpitations and leg swelling.  Gastrointestinal:  Negative for abdominal pain, constipation and heartburn.  Musculoskeletal:  Negative for back pain, joint pain and myalgias.  Skin: Negative.   Neurological:  Negative for dizziness.  Psychiatric/Behavioral:  The patient is not nervous/anxious.     Physical Exam Constitutional:      General: She is not in acute distress.    Appearance: She is well-developed. She is not diaphoretic.  Neck:     Thyroid: No thyromegaly.  Cardiovascular:     Rate and Rhythm: Normal rate and regular rhythm.     Pulses: Normal pulses.     Heart sounds: Normal heart sounds.  Pulmonary:     Effort: Pulmonary effort is normal. No respiratory distress.     Breath sounds: Normal breath sounds.  Abdominal:     General: Bowel sounds are normal. There is no distension.     Palpations: Abdomen is soft.     Tenderness: There is no abdominal tenderness.  Musculoskeletal:        General: Normal range of motion.     Cervical back: Neck supple.     Right lower leg: No edema.     Left lower leg: No edema.  Lymphadenopathy:     Cervical: No cervical adenopathy.  Skin:    General: Skin is warm and dry.  Neurological:     Mental Status: She is alert. Mental status is at baseline.  Psychiatric:        Mood and Affect: Mood normal.      ASSESSMENT/ PLAN:  TODAY  Chronic anemia Hypokalemia Is due for colonoscopy on 02-21-22 Will give k+ 40 meq twice today Will repeat k+ level in the AM    Ok Edwards NP [facility]  call [phone]

## 2022-02-18 NOTE — Pre-Procedure Instructions (Signed)
I received a call from [facility] by [place] concerning pre-op. They were not aware of preop scheduled for 6/6. Patients son is next of kin and will be here with patient on 6/8 for procedure to sign for her. Since order is in for pre-op labs and EKG, they will do these there to save trip for patient. We went over prep and diet instructions as well as brief history via telephone. Also instructed on meds to take that morning as well. Understanding was verbalized.

## 2022-02-18 NOTE — Patient Instructions (Signed)
Rebekah [name]  02/18/2022     '@PREFPERIOPPHARMACY'$ @   Your procedure is scheduled on  02/21/2022.   Report to [facility] at  1200  P.M.   Call this number if you have problems the morning of surgery:  [phone]   Remember:  Follow the diet and prep  instructions given to you by the office.     Take these medicines the morning of surgery with A SIP OF WATER       atenolol, keppra, levothyroxine, zofran(if needed), zoloft.     Do not wear jewelry, make-up or nail polish.  Do not wear lotions, powders, or perfumes, or deodorant.  Do not shave 48 hours prior to surgery.  Men may shave face and neck.  Do not bring valuables to the hospital.  [facility] is not responsible for any belongings or valuables.  Contacts, dentures or bridgework may not be worn into surgery.  Leave your suitcase in the car.  After surgery it may be brought to your room.  For patients admitted to the hospital, discharge time will be determined by your treatment team.  Patients discharged the day of surgery will not be allowed to drive home and must have someone with them for 24 hours.    Special instructions:   DO NOT smoke tobacco or vape for 24 hours before your procedure.  Please read over the following fact sheets that you were given. Anesthesia Post-op Instructions and Care and Recovery After Surgery      Upper Endoscopy, Adult, Care After This sheet gives you information about how to care for yourself after your procedure. Your health care provider may also give you more specific instructions. If you have problems or questions, contact your health care provider. What can I expect after the procedure? After the procedure, it is common to have: A sore throat. Mild stomach pain or discomfort. Bloating. Nausea. Follow these instructions at home:  Follow instructions from your health care provider about what to eat or drink after your procedure. Return to your normal activities  as told by your health care provider. Ask your health care provider what activities are safe for you. Take over-the-counter and prescription medicines only as told by your health care provider. If you were given a sedative during the procedure, it can affect you for several hours. Do not drive or operate machinery until your health care provider says that it is safe. Keep all follow-up visits as told by your health care provider. This is important. Contact a health care provider if you have: A sore throat that lasts longer than one day. Trouble swallowing. Get help right away if: You vomit blood or your vomit looks like coffee grounds. You have: A fever. Bloody, black, or tarry stools. A severe sore throat or you cannot swallow. Difficulty breathing. Severe pain in your chest or abdomen. Summary After the procedure, it is common to have a sore throat, mild stomach discomfort, bloating, and nausea. If you were given a sedative during the procedure, it can affect you for several hours. Do not drive or operate machinery until your health care provider says that it is safe. Follow instructions from your health care provider about what to eat or drink after your procedure. Return to your normal activities as told by your health care provider. This information is not intended to replace advice given to you by your health care provider. Make sure you discuss any questions you have with  your health care provider. Document Revised: 07/09/2019 Document Reviewed: 02/02/2018 Elsevier Patient Education  [place]. Colonoscopy, Adult, Care After The following information offers guidance on how to care for yourself after your procedure. Your health care provider may also give you more specific instructions. If you have problems or questions, contact your health care provider. What can I expect after the procedure? After the procedure, it is common to have: A small amount of blood in your stool  for 24 hours after the procedure. Some gas. Mild cramping or bloating of your abdomen. Follow these instructions at home: Eating and drinking  Drink enough fluid to keep your urine pale yellow. Follow instructions from your health care provider about eating or drinking restrictions. Resume your normal diet as told by your health care provider. Avoid heavy or fried foods that are hard to digest. Activity Rest as told by your health care provider. Avoid sitting for a long time without moving. Get up to take short walks every 1-2 hours. This is important to improve blood flow and breathing. Ask for help if you feel weak or unsteady. Return to your normal activities as told by your health care provider. Ask your health care provider what activities are safe for you. Managing cramping and bloating  Try walking around when you have cramps or feel bloated. If directed, apply heat to your abdomen as told by your health care provider. Use the heat source that your health care provider recommends, such as a moist heat pack or a heating pad. Place a towel between your skin and the heat source. Leave the heat on for 20-30 minutes. Remove the heat if your skin turns bright red. This is especially important if you are unable to feel pain, heat, or cold. You have a greater risk of getting burned. General instructions If you were given a sedative during the procedure, it can affect you for several hours. Do not drive or operate machinery until your health care provider says that it is safe. For the first 24 hours after the procedure: Do not sign important documents. Do not drink alcohol. Do your regular daily activities at a slower pace than normal. Eat soft foods that are easy to digest. Take over-the-counter and prescription medicines only as told by your health care provider. Keep all follow-up visits. This is important. Contact a health care provider if: You have blood in your stool 2-3 days after  the procedure. Get help right away if: You have more than a small spotting of blood in your stool. You have large blood clots in your stool. You have swelling of your abdomen. You have nausea or vomiting. You have a fever. You have increasing pain in your abdomen that is not relieved with medicine. These symptoms may be an emergency. Get help right away. Call 911. Do not wait to see if the symptoms will go away. Do not drive yourself to the hospital. Summary After the procedure, it is common to have a small amount of blood in your stool. You may also have mild cramping and bloating of your abdomen. If you were given a sedative during the procedure, it can affect you for several hours. Do not drive or operate machinery until your health care provider says that it is safe. Get help right away if you have a lot of blood in your stool, nausea or vomiting, a fever, or increased pain in your abdomen. This information is not intended to replace advice given to you by your  health care provider. Make sure you discuss any questions you have with your health care provider. Document Revised: 04/25/2021 Document Reviewed: 04/25/2021 Elsevier Patient Education  [place] After This sheet gives you information about how to care for yourself after your procedure. Your health care provider may also give you more specific instructions. If you have problems or questions, contact your health care provider. What can I expect after the procedure? After the procedure, it is common to have: Tiredness. Forgetfulness about what happened after the procedure. Impaired judgment for important decisions. Nausea or vomiting. Some difficulty with balance. Follow these instructions at home: For the time period you were told by your health care provider:     Rest as needed. Do not participate in activities where you could fall or become injured. Do not drive or use  machinery. Do not drink alcohol. Do not take sleeping pills or medicines that cause drowsiness. Do not make important decisions or sign legal documents. Do not take care of children on your own. Eating and drinking Follow the diet that is recommended by your health care provider. Drink enough fluid to keep your urine pale yellow. If you vomit: Drink water, juice, or soup when you can drink without vomiting. Make sure you have little or no nausea before eating solid foods. General instructions Have a responsible adult stay with you for the time you are told. It is important to have someone help care for you until you are awake and alert. Take over-the-counter and prescription medicines only as told by your health care provider. If you have sleep apnea, surgery and certain medicines can increase your risk for breathing problems. Follow instructions from your health care provider about wearing your sleep device: Anytime you are sleeping, including during daytime naps. While taking prescription pain medicines, sleeping medicines, or medicines that make you drowsy. Avoid smoking. Keep all follow-up visits as told by your health care provider. This is important. Contact a health care provider if: You keep feeling nauseous or you keep vomiting. You feel light-headed. You are still sleepy or having trouble with balance after 24 hours. You develop a rash. You have a fever. You have redness or swelling around the IV site. Get help right away if: You have trouble breathing. You have new-onset confusion at home. Summary For several hours after your procedure, you may feel tired. You may also be forgetful and have poor judgment. Have a responsible adult stay with you for the time you are told. It is important to have someone help care for you until you are awake and alert. Rest as told. Do not drive or operate machinery. Do not drink alcohol or take sleeping pills. Get help right away if you have  trouble breathing, or if you suddenly become confused. This information is not intended to replace advice given to you by your health care provider. Make sure you discuss any questions you have with your health care provider. Document Revised: 08/07/2021 Document Reviewed: 08/05/2019 Elsevier Patient Education  [place].

## 2022-02-19 ENCOUNTER — Encounter (HOSPITAL_COMMUNITY)
Admission: RE | Admit: 2022-02-19 | Discharge: 2022-02-19 | Disposition: A | Discharge status: Home / Self Care | Payer: Medicare Other | Source: Ambulatory Visit | Attending: Internal Medicine | Admitting: Internal Medicine

## 2022-02-19 ENCOUNTER — Non-Acute Institutional Stay (SKILLED_NURSING_FACILITY): Payer: Medicare Other | Admitting: Adult Health

## 2022-02-19 ENCOUNTER — Encounter: Payer: Self-pay | Admitting: Adult Health

## 2022-02-19 ENCOUNTER — Other Ambulatory Visit (HOSPITAL_COMMUNITY)
Admission: RE | Admit: 2022-02-19 | Discharge: 2022-02-19 | Disposition: A | Discharge status: Home / Self Care | Payer: Medicare Other | Source: Ambulatory Visit | Attending: Internal Medicine | Admitting: Internal Medicine

## 2022-02-19 DIAGNOSIS — E876 Hypokalemia: Secondary | ICD-10-CM

## 2022-02-19 DIAGNOSIS — I69398 Other sequelae of cerebral infarction: Secondary | ICD-10-CM | POA: Diagnosis not present

## 2022-02-19 DIAGNOSIS — R279 Unspecified lack of coordination: Secondary | ICD-10-CM | POA: Diagnosis not present

## 2022-02-19 DIAGNOSIS — D649 Anemia, unspecified: Secondary | ICD-10-CM | POA: Insufficient documentation

## 2022-02-19 DIAGNOSIS — M6281 Muscle weakness (generalized): Secondary | ICD-10-CM | POA: Diagnosis not present

## 2022-02-19 DIAGNOSIS — D509 Iron deficiency anemia, unspecified: Secondary | ICD-10-CM | POA: Diagnosis not present

## 2022-02-19 DIAGNOSIS — I10 Essential (primary) hypertension: Secondary | ICD-10-CM

## 2022-02-19 DIAGNOSIS — Z01812 Encounter for preprocedural laboratory examination: Secondary | ICD-10-CM | POA: Diagnosis not present

## 2022-02-19 DIAGNOSIS — E43 Unspecified severe protein-calorie malnutrition: Secondary | ICD-10-CM | POA: Diagnosis not present

## 2022-02-19 DIAGNOSIS — R2681 Unsteadiness on feet: Secondary | ICD-10-CM | POA: Diagnosis not present

## 2022-02-19 HISTORY — DX: Unspecified convulsions: R56.9

## 2022-02-19 HISTORY — DX: Systemic lupus erythematosus, unspecified: M32.9

## 2022-02-19 HISTORY — DX: Dysphagia, unspecified: R13.10

## 2022-02-19 HISTORY — DX: Cerebral infarction, unspecified: I63.9

## 2022-02-19 LAB — CBC WITH DIFFERENTIAL/PLATELET
Abs Immature Granulocytes: 0.02 10*3/uL (ref 0.00–0.07)
Basophils Absolute: 0 10*3/uL (ref 0.0–0.1)
Basophils Relative: 1 %
Eosinophils Absolute: 0 10*3/uL (ref 0.0–0.5)
Eosinophils Relative: 1 %
HCT: 26 % — ABNORMAL LOW (ref 36.0–46.0)
Hemoglobin: 8.3 g/dL — ABNORMAL LOW (ref 12.0–15.0)
Immature Granulocytes: 1 %
Lymphocytes Relative: 22 %
Lymphs Abs: 0.7 10*3/uL (ref 0.7–4.0)
MCH: 26.3 pg (ref 26.0–34.0)
MCHC: 31.9 g/dL (ref 30.0–36.0)
MCV: 82.5 fL (ref 80.0–100.0)
Monocytes Absolute: 0.2 10*3/uL (ref 0.1–1.0)
Monocytes Relative: 5 %
Neutro Abs: 2.3 10*3/uL (ref 1.7–7.7)
Neutrophils Relative %: 70 %
Platelets: 414 10*3/uL — ABNORMAL HIGH (ref 150–400)
RBC: 3.15 MIL/uL — ABNORMAL LOW (ref 3.87–5.11)
RDW: 21.1 % — ABNORMAL HIGH (ref 11.5–15.5)
WBC: 3.2 10*3/uL — ABNORMAL LOW (ref 4.0–10.5)
nRBC: 0 % (ref 0.0–0.2)

## 2022-02-19 LAB — POTASSIUM: Potassium: 2.6 mmol/L — CL (ref 3.5–5.1)

## 2022-02-19 NOTE — Progress Notes (Unsigned)
Location:  [place] Room Number: 157 Place of Service:  SNF (31)   CODE STATUS: full   No Known Allergies  Chief Complaint  Patient presents with   Acute Visit    Hypokalemia     HPI:  Yesterday her k+ was 3.0 and received 40 meq of k+ twice. Today her k+ is 2.6. she is not taking any k+ depleting medications.   Past Medical History:  Diagnosis Date   Arthritis    Rheumatoid   Dysphagia    post CVA   Hypertension    Lupus (systemic lupus erythematosus) ([place])    Seizure ([place])    Stroke [facility])     Past Surgical History:  Procedure Laterality Date   CESAREAN SECTION      Social History   Socioeconomic History   Marital status: Single    Spouse name: Not on file   Number of children: Not on file   Years of education: Not on file   Highest education level: Not on file  Occupational History   Not on file  Tobacco Use   Smoking status: Never   Smokeless tobacco: Never  Vaping Use   Vaping Use: Never used  Substance and Sexual Activity   Alcohol use: No    Alcohol/week: 0.0 standard drinks   Drug use: No   Sexual activity: Not on file  Other Topics Concern   Not on file  Social History Narrative   Lives with sister.     Social Determinants of Health   Financial Resource Strain: Not on file  Food Insecurity: Not on file  Transportation Needs: Not on file  Physical Activity: Not on file  Stress: Not on file  Social Connections: Not on file  Intimate Partner Violence: Not on file   Family History  Problem Relation Age of Onset   Diabetes Mother    Hypertension Mother    Cancer Mother    Stroke Mother    Colon cancer Father    Hypertension Sister    Hypertension Sister    Hypertension Sister    Hypertension Sister    Hypertension Sister    Hypertension Sister    Hypertension Brother    Hypertension Brother       VITAL SIGNS BP 110/74   Pulse 70   Temp 98.5 F (36.9 C)   Resp 18   Ht '5\' 6"'$  (1.676 m)   Wt  130 lb 3.2 oz (59.1 kg)   BMI 21.01 kg/m   Outpatient Encounter Medications as of 02/19/2022  Medication Sig   acetaminophen (TYLENOL) 325 MG tablet Take 650 mg by mouth every 6 (six) hours as needed.   aspirin 81 MG chewable tablet Chew by mouth daily.   atenolol (TENORMIN) 25 MG tablet Take 12.5 mg by mouth daily.   Balsam [place]-Castor Oil (VENELEX) OINT Apply topically. Special Instructions: Apply to sacrum, bilateral buttocks and coccyx qshift for prevention. Every Shift   dronabinol (MARINOL) 2.5 MG capsule Take 1 capsule (2.5 mg total) by mouth daily before lunch.   Ensure Plus (ENSURE PLUS) LIQD Take 237 mLs by mouth 2 (two) times daily between meals.   levETIRAcetam (KEPPRA) 750 MG tablet Take 750 mg by mouth 2 (two) times daily.   levothyroxine (SYNTHROID) 50 MCG tablet Take 1 tablet (50 mcg total) by mouth daily. Take on an empty stomach   omega-3 acid ethyl esters (LOVAZA) 1 g capsule Take 1 g by mouth 2 (two) times daily.  ondansetron (ZOFRAN-ODT) 4 MG disintegrating tablet Take 4 mg by mouth every 8 (eight) hours as needed for nausea or vomiting.   pantoprazole (PROTONIX) 40 MG tablet Take 40 mg by mouth 2 (two) times daily.   polyethylene glycol-electrolytes (TRILYTE) 420 g solution Take 4,000 mLs by mouth as directed.   sennosides-docusate sodium (SENOKOT-S) 8.6-50 MG tablet Take 1 tablet by mouth 2 (two) times daily.   sertraline (ZOLOFT) 100 MG tablet Take 100 mg by mouth daily.   ticagrelor (BRILINTA) 90 MG TABS tablet Take 90 mg by mouth 2 (two) times daily.   UNABLE TO FIND Diet: Regular diet with thin liquids   No facility-administered encounter medications on file as of 02/19/2022.     SIGNIFICANT DIAGNOSTIC EXAMS  LABS REVIEWED:   12-14-21; wbc 3.4; hgb 8.9; hct 29.2; plt 723; glucose 80; bun 20; creat 1.36; k+ 5.2; na++ 136; ca 8.7  12-18-21: wbc 4.3; hgb 8.7; hct 28.8; plt 710; glucose 83; bun 20; creat 1.36;  k+ 4.1; na++ 141; ca 8.8   TODAY  01-10-22: wbc 4.5;  hgb 8.0; hct 25.8; mcv 82.2 plt 603; glucose 76; bun 19; creat 1.28; k+ 3.6; na++ 1140; ca 8.5; gfr 46; protein 7.0; albumin 2.3; chol 98; ldl 48; trig 151; hdl 20; tsh 2.976 free t4: 1.00; iron 23; tibc 135 02-18-22: hgb 7.7; hct 24.7; glucose 47; bun 9; creat 0.80; k+ 3.0; na++ 136; ca 7.6; gfr>60; protein 5.8; albumin 1.9 02-19-22: wbc 3.2; hgb 8.3; hct 26.0; mcv 82.5 plt 141; k+ 2.6   Review of Systems  Constitutional:  Negative for malaise/fatigue.  Respiratory:  Negative for cough and shortness of breath.   Cardiovascular:  Negative for chest pain, palpitations and leg swelling.  Gastrointestinal:  Negative for abdominal pain, constipation and heartburn.  Musculoskeletal:  Negative for back pain, joint pain and myalgias.  Skin: Negative.   Neurological:  Negative for dizziness.  Psychiatric/Behavioral:  The patient is not nervous/anxious.     Physical Exam Constitutional:      General: She is not in acute distress.    Appearance: She is well-developed. She is not diaphoretic.  Neck:     Thyroid: No thyromegaly.  Cardiovascular:     Rate and Rhythm: Normal rate and regular rhythm.     Heart sounds: Normal heart sounds.  Pulmonary:     Effort: Pulmonary effort is normal. No respiratory distress.     Breath sounds: Normal breath sounds.  Abdominal:     General: Bowel sounds are normal. There is no distension.     Palpations: Abdomen is soft.     Tenderness: There is no abdominal tenderness.  Musculoskeletal:        General: Normal range of motion.     Cervical back: Neck supple.     Right lower leg: No edema.     Left lower leg: No edema.  Lymphadenopathy:     Cervical: No cervical adenopathy.  Skin:    General: Skin is warm and dry.  Neurological:     Mental Status: She is alert and oriented to person, place, and time.     ASSESSMENT/ PLAN:  TODAY  Hypokalemia Protein calorie malnutrition severe  Will stop marinol as this medication is not effective; has failed  remeron therapy Will give k+ 40 meq four times today Will repeat k+ in the AM    Ok Edwards NP [facility]  call [phone]

## 2022-02-19 NOTE — Pre-Procedure Instructions (Signed)
Interoffice messaged Dr Abbey Chatters about lab results.

## 2022-02-19 NOTE — Pre-Procedure Instructions (Signed)
Dr Briant Cedar aware of labs. No orders given.

## 2022-02-20 ENCOUNTER — Encounter (HOSPITAL_COMMUNITY)
Admission: RE | Admit: 2022-02-20 | Discharge: 2022-02-20 | Disposition: A | Discharge status: Home / Self Care | Payer: Medicare Other | Source: Skilled Nursing Facility | Attending: Adult Health | Admitting: Adult Health

## 2022-02-20 DIAGNOSIS — E43 Unspecified severe protein-calorie malnutrition: Secondary | ICD-10-CM | POA: Insufficient documentation

## 2022-02-20 DIAGNOSIS — R279 Unspecified lack of coordination: Secondary | ICD-10-CM | POA: Diagnosis not present

## 2022-02-20 DIAGNOSIS — E876 Hypokalemia: Secondary | ICD-10-CM | POA: Diagnosis not present

## 2022-02-20 DIAGNOSIS — R2681 Unsteadiness on feet: Secondary | ICD-10-CM | POA: Diagnosis not present

## 2022-02-20 DIAGNOSIS — I69398 Other sequelae of cerebral infarction: Secondary | ICD-10-CM | POA: Diagnosis not present

## 2022-02-20 DIAGNOSIS — M6281 Muscle weakness (generalized): Secondary | ICD-10-CM | POA: Diagnosis not present

## 2022-02-20 LAB — POTASSIUM: Potassium: 4.7 mmol/L (ref 3.5–5.1)

## 2022-02-21 ENCOUNTER — Encounter (HOSPITAL_COMMUNITY): Payer: Self-pay

## 2022-02-21 ENCOUNTER — Ambulatory Visit (HOSPITAL_COMMUNITY): Payer: Medicare Other | Admitting: Anesthesiology

## 2022-02-21 ENCOUNTER — Ambulatory Visit (HOSPITAL_BASED_OUTPATIENT_CLINIC_OR_DEPARTMENT_OTHER): Payer: Medicare Other | Admitting: Anesthesiology

## 2022-02-21 ENCOUNTER — Ambulatory Visit (HOSPITAL_COMMUNITY)
Admission: RE | Admit: 2022-02-21 | Discharge: 2022-02-21 | Disposition: A | Discharge status: Home / Self Care | Payer: Medicare Other | Attending: Internal Medicine | Admitting: Internal Medicine

## 2022-02-21 ENCOUNTER — Encounter (HOSPITAL_COMMUNITY)
Admission: RE | Disposition: A | Discharge status: Home / Self Care | Payer: Self-pay | Source: Home / Self Care | Attending: Internal Medicine

## 2022-02-21 DIAGNOSIS — K635 Polyp of colon: Secondary | ICD-10-CM | POA: Diagnosis not present

## 2022-02-21 DIAGNOSIS — F32A Depression, unspecified: Secondary | ICD-10-CM | POA: Insufficient documentation

## 2022-02-21 DIAGNOSIS — I10 Essential (primary) hypertension: Secondary | ICD-10-CM | POA: Insufficient documentation

## 2022-02-21 DIAGNOSIS — K5730 Diverticulosis of large intestine without perforation or abscess without bleeding: Secondary | ICD-10-CM | POA: Diagnosis not present

## 2022-02-21 DIAGNOSIS — E039 Hypothyroidism, unspecified: Secondary | ICD-10-CM | POA: Diagnosis not present

## 2022-02-21 DIAGNOSIS — K219 Gastro-esophageal reflux disease without esophagitis: Secondary | ICD-10-CM | POA: Insufficient documentation

## 2022-02-21 DIAGNOSIS — R634 Abnormal weight loss: Secondary | ICD-10-CM | POA: Insufficient documentation

## 2022-02-21 DIAGNOSIS — K2970 Gastritis, unspecified, without bleeding: Secondary | ICD-10-CM

## 2022-02-21 DIAGNOSIS — B3781 Candidal esophagitis: Secondary | ICD-10-CM

## 2022-02-21 DIAGNOSIS — K648 Other hemorrhoids: Secondary | ICD-10-CM | POA: Insufficient documentation

## 2022-02-21 DIAGNOSIS — R195 Other fecal abnormalities: Secondary | ICD-10-CM | POA: Diagnosis not present

## 2022-02-21 DIAGNOSIS — E876 Hypokalemia: Secondary | ICD-10-CM

## 2022-02-21 DIAGNOSIS — D120 Benign neoplasm of cecum: Secondary | ICD-10-CM | POA: Insufficient documentation

## 2022-02-21 DIAGNOSIS — Z800 Family history of malignant neoplasm of digestive organs: Secondary | ICD-10-CM | POA: Diagnosis not present

## 2022-02-21 DIAGNOSIS — K6389 Other specified diseases of intestine: Secondary | ICD-10-CM

## 2022-02-21 DIAGNOSIS — D509 Iron deficiency anemia, unspecified: Secondary | ICD-10-CM | POA: Diagnosis not present

## 2022-02-21 DIAGNOSIS — R112 Nausea with vomiting, unspecified: Secondary | ICD-10-CM | POA: Diagnosis not present

## 2022-02-21 HISTORY — PX: POLYPECTOMY: SHX5525

## 2022-02-21 HISTORY — PX: ESOPHAGEAL BRUSHING: SHX6842

## 2022-02-21 HISTORY — PX: COLONOSCOPY WITH PROPOFOL: SHX5780

## 2022-02-21 HISTORY — PX: BIOPSY: SHX5522

## 2022-02-21 HISTORY — PX: ESOPHAGOGASTRODUODENOSCOPY (EGD) WITH PROPOFOL: SHX5813

## 2022-02-21 LAB — KOH PREP

## 2022-02-21 SURGERY — COLONOSCOPY WITH PROPOFOL
Anesthesia: General

## 2022-02-21 MED ORDER — LACTATED RINGERS IV SOLN: INTRAVENOUS | Status: DC

## 2022-02-21 MED ORDER — PHENYLEPHRINE HCL (PRESSORS) 10 MG/ML IV SOLN
INTRAVENOUS | Status: DC | PRN
  Administered 2022-02-21: 100 ug via INTRAVENOUS

## 2022-02-21 MED ORDER — PROPOFOL 10 MG/ML IV BOLUS
INTRAVENOUS | Status: DC | PRN
  Administered 2022-02-21: 20 mg via INTRAVENOUS
  Administered 2022-02-21: 60 mg via INTRAVENOUS

## 2022-02-21 MED ORDER — PROPOFOL 500 MG/50ML IV EMUL
INTRAVENOUS | Status: DC | PRN
  Administered 2022-02-21: 150 ug/kg/min via INTRAVENOUS

## 2022-02-21 NOTE — Interval H&P Note (Signed)
History and Physical Interval Note:  02/21/2022 12:57 PM  Rebekah [name]  has presented today for surgery, with the diagnosis of nausea/vomiting, heme positive stool, iron deficiency anemia, weight loss.  The various methods of treatment have been discussed with the patient and family. After consideration of risks, benefits and other options for treatment, the patient has consented to  Procedure(s) with comments: COLONOSCOPY WITH PROPOFOL (N/A) - 2:00pm ESOPHAGOGASTRODUODENOSCOPY (EGD) WITH PROPOFOL (N/A) as a surgical intervention.  The patient's history has been reviewed, patient examined, no change in status, stable for surgery.  I have reviewed the patient's chart and labs.  Questions were answered to the patient's satisfaction.     [name]

## 2022-02-21 NOTE — Discharge Instructions (Addendum)
EGD Discharge instructions Please read the instructions outlined below and refer to this sheet in the next few weeks. These discharge instructions provide you with general information on caring for yourself after you leave the hospital. Your doctor may also give you specific instructions. While your treatment has been planned according to the most current medical practices available, unavoidable complications occasionally occur. If you have any problems or questions after discharge, please call your doctor. ACTIVITY You may resume your regular activity but move at a slower pace for the next 24 hours.  Take frequent rest periods for the next 24 hours.  Walking will help expel (get rid of) the air and reduce the bloated feeling in your abdomen.  No driving for 24 hours (because of the anesthesia (medicine) used during the test).  You may shower.  Do not sign any important legal documents or operate any machinery for 24 hours (because of the anesthesia used during the test).  NUTRITION Drink plenty of fluids.  You may resume your normal diet.  Begin with a light meal and progress to your normal diet.  Avoid alcoholic beverages for 24 hours or as instructed by your caregiver.  MEDICATIONS You may resume your normal medications unless your caregiver tells you otherwise.  WHAT YOU CAN EXPECT TODAY You may experience abdominal discomfort such as a feeling of fullness or "gas" pains.  FOLLOW-UP Your doctor will discuss the results of your test with you.  SEEK IMMEDIATE MEDICAL ATTENTION IF ANY OF THE FOLLOWING OCCUR: Excessive nausea (feeling sick to your stomach) and/or vomiting.  Severe abdominal pain and distention (swelling).  Trouble swallowing.  Temperature over 101 F (37.8 C).  Rectal bleeding or vomiting of blood.     Colonoscopy Discharge Instructions  Read the instructions outlined below and refer to this sheet in the next few weeks. These discharge instructions provide you with  general information on caring for yourself after you leave the hospital. Your doctor may also give you specific instructions. While your treatment has been planned according to the most current medical practices available, unavoidable complications occasionally occur.   ACTIVITY You may resume your regular activity, but move at a slower pace for the next 24 hours.  Take frequent rest periods for the next 24 hours.  Walking will help get rid of the air and reduce the bloated feeling in your belly (abdomen).  No driving for 24 hours (because of the medicine (anesthesia) used during the test).   Do not sign any important legal documents or operate any machinery for 24 hours (because of the anesthesia used during the test).  NUTRITION Drink plenty of fluids.  You may resume your normal diet as instructed by your doctor.  Begin with a light meal and progress to your normal diet. Heavy or fried foods are harder to digest and may make you feel sick to your stomach (nauseated).  Avoid alcoholic beverages for 24 hours or as instructed.  MEDICATIONS You may resume your normal medications unless your doctor tells you otherwise.  WHAT YOU CAN EXPECT TODAY Some feelings of bloating in the abdomen.  Passage of more gas than usual.  Spotting of blood in your stool or on the toilet paper.  IF YOU HAD POLYPS REMOVED DURING THE COLONOSCOPY: No aspirin products for 7 days or as instructed.  No alcohol for 7 days or as instructed.  Eat a soft diet for the next 24 hours.  FINDING OUT THE RESULTS OF YOUR TEST Not all test results are  available during your visit. If your test results are not back during the visit, make an appointment with your caregiver to find out the results. Do not assume everything is normal if you have not heard from your caregiver or the medical facility. It is important for you to follow up on all of your test results.  SEEK IMMEDIATE MEDICAL ATTENTION IF: You have more than a spotting of  blood in your stool.  Your belly is swollen (abdominal distention).  You are nauseated or vomiting.  You have a temperature over 101.  You have abdominal pain or discomfort that is severe or gets worse throughout the day.   Your EGD revealed mild amount inflammation in your stomach.  I took biopsies of this to rule out infection with a bacteria called H. pylori.  Await pathology results, my office will contact you.  Also appears that you have a yeast infection of your esophagus.  I took samples of this as well.  We will treat you for this if cytology positive.  Your colonoscopy revealed 1 polyp(s) which I removed successfully. Await pathology results, my office will contact you. I recommend repeating colonoscopy in 5 years for surveillance purposes.   Follow-up with GI in 3 to 4 months   I hope you have a great rest of your week!  Elon Alas. Abbey Chatters, D.O. Gastroenterology and Hepatology [facility]

## 2022-02-21 NOTE — Op Note (Signed)
[facility] Patient Name: Rebekah [name] Procedure Date: 02/21/2022 1:53 PM MRN: [id number] Date of Birth: 08-04-1956 Attending MD: Elon Alas. Abbey Chatters DO CSN: [id number] Age: 66 Admit Type: Outpatient Procedure:                Colonoscopy Indications:              Heme positive stool, Family history of colon                            cancer, Weight loss Providers:                Elon Alas. Abbey Chatters, DO, Charlsie Quest. Theda Sers RN, RN,                            Janeece Riggers, RN, Kristine L. Risa Grill, Technician Referring MD:              Medicines:                See the Anesthesia note for documentation of the                            administered medications Complications:            No immediate complications. Estimated Blood Loss:     Estimated blood loss was minimal. Procedure:                Pre-Anesthesia Assessment:                           - The anesthesia plan was to use monitored                            anesthesia care (MAC).                           After obtaining informed consent, the colonoscope                            was passed under direct vision. Throughout the                            procedure, the patient's blood pressure, pulse, and                            oxygen saturations were monitored continuously. The                            [id number]-HQ190L ([id number]) scope was introduced through                            the anus and advanced to the the terminal ileum,                            with identification of the appendiceal orifice and  IC valve. The colonoscopy was performed without                            difficulty. The patient tolerated the procedure                            well. The quality of the bowel preparation was                            evaluated using the BBPS [facility] Bowel Preparation                            Scale) with scores of: Right Colon = 3, Transverse                            Colon = 3  and Left Colon = 3 (entire mucosa seen                            well with no residual staining, small fragments of                            stool or opaque liquid). The total BBPS score                            equals 9. Scope In: 1:56:02 PM Scope Out: 2:06:02 PM Scope Withdrawal Time: 0 hours 7 minutes 7 seconds  Total Procedure Duration: 0 hours 10 minutes 0 seconds  Findings:      The perianal and digital rectal examinations were normal.      Non-bleeding internal hemorrhoids were found during endoscopy.      Multiple small-mouthed diverticula were found in the sigmoid colon.      A 8 mm polyp was found in the appendiceal orifice. The polyp was flat.       The polyp was removed with a cold snare. Resection and retrieval were       complete.      A diffuse area of mild melanosis was found in the entire colon.      The terminal ileum appeared normal. Impression:               - Non-bleeding internal hemorrhoids.                           - Diverticulosis in the sigmoid colon.                           - One 8 mm polyp at the appendiceal orifice,                            removed with a cold snare. Resected and retrieved.                           - Melanosis in the colon.                           -  The examined portion of the ileum was normal. Moderate Sedation:      Per Anesthesia Care Recommendation:           - Patient has a contact number available for                            emergencies. The signs and symptoms of potential                            delayed complications were discussed with the                            patient. Return to normal activities tomorrow.                            Written discharge instructions were provided to the                            patient.                           - Resume previous diet.                           - Continue present medications.                           - Await pathology results.                           -  Repeat colonoscopy in 5 years for surveillance.                           - Return to GI clinic in 4 months. Procedure Code(s):        --- Professional ---                           [phone], Colonoscopy, flexible; with removal of                            tumor(s), polyp(s), or other lesion(s) by snare                            technique Diagnosis Code(s):        --- Professional ---                           K64.8, Other hemorrhoids                           K63.5, Polyp of colon                           K63.89, Other specified diseases of intestine                           R19.5, Other fecal abnormalities  Z80.0, Family history of malignant neoplasm of                            digestive organs                           R63.4, Abnormal weight loss                           K57.30, Diverticulosis of large intestine without                            perforation or abscess without bleeding CPT copyright 2019 American [employer] Association. All rights reserved. The codes documented in this report are preliminary and upon coder review may  be revised to meet current compliance requirements. Elon Alas. Abbey Chatters, DO [name], DO 02/21/2022 2:11:38 PM This report has been signed electronically. Number of Addenda: 0

## 2022-02-21 NOTE — Op Note (Signed)
[facility] Patient Name: Rebekah [name] Procedure Date: 02/21/2022 1:30 PM MRN: [id number] Date of Birth: [birth year] Attending MD: Elon Alas. Abbey Chatters DO CSN: [id number] Age: 66 Admit Type: Outpatient Procedure:                Upper GI endoscopy Indications:              Nausea with vomiting, Weight loss Providers:                Elon Alas. Abbey Chatters, DO, Charlsie Quest. Theda Sers RN, RN,                            Janeece Riggers, RN, Kristine L. Risa Grill, Technician Referring MD:              Medicines:                See the Anesthesia note for documentation of the                            administered medications Complications:            No immediate complications. Estimated Blood Loss:     Estimated blood loss was minimal. Procedure:                Pre-Anesthesia Assessment:                           - The anesthesia plan was to use monitored                            anesthesia care (MAC).                           After obtaining informed consent, the endoscope was                            passed under direct vision. Throughout the                            procedure, the patient's blood pressure, pulse, and                            oxygen saturations were monitored continuously. The                            [id number]-H190 ([id number]) scope was introduced through the                            mouth, and advanced to the second part of duodenum.                            The upper GI endoscopy was accomplished without                            difficulty. The patient tolerated the procedure  well. Scope In: 1:47:07 PM Scope Out: 1:50:55 PM Total Procedure Duration: 0 hours 3 minutes 48 seconds  Findings:      Esophagitis with no bleeding was found in the middle third of the       esophagus. Cells for cytology were obtained by brushing.      Patchy mild inflammation characterized by erythema was found in the       gastric body and in the gastric  antrum. Biopsies were taken with a cold       forceps for Helicobacter pylori testing.      The duodenal bulb, first portion of the duodenum and second portion of       the duodenum were normal. Impression:               - Candidiasis esophagitis with no bleeding. Cells                            for cytology obtained.                           - Gastritis. Biopsied.                           - Normal duodenal bulb, first portion of the                            duodenum and second portion of the duodenum. Moderate Sedation:      Per Anesthesia Care Recommendation:           - Patient has a contact number available for                            emergencies. The signs and symptoms of potential                            delayed complications were discussed with the                            patient. Return to normal activities tomorrow.                            Written discharge instructions were provided to the                            patient.                           - Resume previous diet.                           - Continue present medications.                           - Await pathology results.                           - Use a proton pump inhibitor PO BID.                           -  Treat for candidal esophagitis if cytology                            positive. Procedure Code(s):        --- Professional ---                           [phone], Esophagogastroduodenoscopy, flexible,                            transoral; with biopsy, single or multiple Diagnosis Code(s):        --- Professional ---                           B37.81, Candidal esophagitis                           K29.70, Gastritis, unspecified, without bleeding                           R11.2, Nausea with vomiting, unspecified                           R63.4, Abnormal weight loss CPT copyright 2019 American Medical Association. All rights reserved. The codes documented in this report are preliminary and upon  coder review may  be revised to meet current compliance requirements. Elon Alas. Abbey Chatters, DO [place] Abbey Chatters, DO 02/21/2022 1:53:50 PM This report has been signed electronically. Number of Addenda: 0

## 2022-02-21 NOTE — Transfer of Care (Cosign Needed Addendum)
Immediate Anesthesia Transfer of Care Note  Patient: Rebekah [name]  Procedure(s) Performed: COLONOSCOPY WITH PROPOFOL ESOPHAGOGASTRODUODENOSCOPY (EGD) WITH PROPOFOL ESOPHAGEAL BRUSHING BIOPSY POLYPECTOMY  Patient Location: PACU  Anesthesia Type:General  Level of Consciousness: sedated and patient cooperative  Airway & Oxygen Therapy: Patient Spontanous Breathing  Post-op Assessment: Report given to RN, Post -op Vital signs reviewed and stable and Patient moving all extremities  Post vital signs: Reviewed and stable  Last Vitals:  Vitals Value Taken Time  BP 112/66   Temp 97.6   Pulse 80   Resp 15   SpO2 97     Last Pain:  Vitals:   02/21/22 1344  PainSc: 0-No pain         Complications: No notable events documented.

## 2022-02-21 NOTE — Anesthesia Preprocedure Evaluation (Signed)
Anesthesia Evaluation  Patient identified by MRN, date of birth, ID band Patient awake    Reviewed: Allergy & Precautions, H&P , NPO status , Patient's Chart, lab work & pertinent test results, reviewed documented beta blocker date and time   Airway Mallampati: II  TM Distance: >3 FB Neck ROM: full    Dental no notable dental hx.    Pulmonary neg pulmonary ROS,    Pulmonary exam normal breath sounds clear to auscultation       Cardiovascular Exercise Tolerance: Good hypertension, negative cardio ROS   Rhythm:regular Rate:Normal     Neuro/Psych Seizures -,  PSYCHIATRIC DISORDERS Depression CVA, Residual Symptoms    GI/Hepatic Neg liver ROS, GERD  Medicated,  Endo/Other  Hypothyroidism   Renal/GU CRFRenal disease  negative genitourinary   Musculoskeletal   Abdominal   Peds  Hematology  (+) Blood dyscrasia, anemia ,   Anesthesia Other Findings   Reproductive/Obstetrics negative OB ROS                             Anesthesia Physical Anesthesia Plan  ASA: 3  Anesthesia Plan: General   Post-op Pain Management:    Induction:   PONV Risk Score and Plan: Propofol infusion  Airway Management Planned:   Additional Equipment:   Intra-op Plan:   Post-operative Plan:   Informed Consent: I have reviewed the patients History and Physical, chart, labs and discussed the procedure including the risks, benefits and alternatives for the proposed anesthesia with the patient or authorized representative who has indicated his/her understanding and acceptance.     Dental Advisory Given  Plan Discussed with: CRNA  Anesthesia Plan Comments:         Anesthesia Quick Evaluation

## 2022-02-22 NOTE — Anesthesia Postprocedure Evaluation (Signed)
Anesthesia Post Note  Patient: LUV MISH  Procedure(s) Performed: COLONOSCOPY WITH PROPOFOL ESOPHAGOGASTRODUODENOSCOPY (EGD) WITH PROPOFOL ESOPHAGEAL BRUSHING BIOPSY POLYPECTOMY  Patient location during evaluation: Phase II Anesthesia Type: General Level of consciousness: awake Pain management: pain level controlled Vital Signs Assessment: post-procedure vital signs reviewed and stable Respiratory status: spontaneous breathing and respiratory function stable Cardiovascular status: blood pressure returned to baseline and stable Postop Assessment: no headache and no apparent nausea or vomiting Anesthetic complications: no Comments: Late entry   No notable events documented.   Last Vitals:  Vitals:   02/21/22 1330 02/21/22 1415  BP:  112/66  Pulse:  87  Resp: (!) 21 (!) 21  Temp:  36.4 C  SpO2:  99%    Last Pain:  Vitals:   02/21/22 1415  TempSrc: Oral  PainSc: 0-No pain                 Louann Sjogren

## 2022-02-25 ENCOUNTER — Other Ambulatory Visit (HOSPITAL_COMMUNITY): Admission: RE | Admit: 2022-02-25 | Payer: Medicare Other | Source: Skilled Nursing Facility | Admitting: Adult Health

## 2022-02-25 LAB — SURGICAL PATHOLOGY

## 2022-02-27 ENCOUNTER — Non-Acute Institutional Stay (SKILLED_NURSING_FACILITY): Payer: Medicare Other | Admitting: Adult Health

## 2022-02-27 ENCOUNTER — Encounter: Payer: Self-pay | Admitting: Adult Health

## 2022-02-27 DIAGNOSIS — D649 Anemia, unspecified: Secondary | ICD-10-CM | POA: Diagnosis not present

## 2022-02-27 DIAGNOSIS — Z0000 Encounter for general adult medical examination without abnormal findings: Secondary | ICD-10-CM

## 2022-02-27 DIAGNOSIS — E039 Hypothyroidism, unspecified: Secondary | ICD-10-CM | POA: Diagnosis not present

## 2022-02-27 DIAGNOSIS — I69398 Other sequelae of cerebral infarction: Secondary | ICD-10-CM | POA: Diagnosis not present

## 2022-02-27 DIAGNOSIS — I10 Essential (primary) hypertension: Secondary | ICD-10-CM | POA: Diagnosis not present

## 2022-02-27 NOTE — Patient Instructions (Signed)
   Ms. Culliton , Thank you for taking time to come for your Medicare Wellness Visit. I appreciate your ongoing commitment to your health goals. Please review the following plan we discussed and let me know if I can assist you in the future.   These are the goals we discussed:  Goals      Absence of Fall and Fall-Related Injury     Evidence-based guidance:  Assess fall risk using a validated tool when available. Consider balance and gait impairment, muscle weakness, diminished vision or hearing, environmental hazards, presence of urinary or bowel urgency and/or incontinence.  Communicate fall injury risk to interprofessional healthcare team.  Develop a fall prevention plan with the patient and family.  Promote use of personal vision and auditory aids.  Promote reorientation, appropriate sensory stimulation, and routines to decrease risk of fall when changes in mental status are present.  Assess assistance level required for safe and effective self-care; consider referral for home care.  Encourage physical activity, such as performance of self-care at highest level of ability, strength and balance exercise program, and provision of appropriate assistive devices; refer to rehabilitation therapy.  Refer to community-based fall prevention program where available.  If fall occurs, determine the cause and revise fall injury prevention plan.  Regularly review medication contribution to fall risk; consider risk related to polypharmacy and age.  Refer to pharmacist for consultation when concerns about medications are revealed.  Balance adequate pain management with potential for oversedation.  Provide guidance related to environmental modifications.  Consider supplementation with Vitamin D.   Notes:      DIET - INCREASE WATER INTAKE     General - Client will not be readmitted within 30 days (C-SNP)     Have 3 meals a day        This is a list of the screening recommended for you and due dates:   Health Maintenance  Topic Date Due   Hepatitis C Screening: USPSTF Recommendation to screen - Ages 61-79 yo.  Never done   Zoster (Shingles) Vaccine (1 of 2) Never done   Mammogram  Never done   Pneumonia Vaccine (1 - PCV) Never done   DEXA scan (bone density measurement)  Never done   COVID-19 Vaccine (4 - Booster for Pfizer series) 04/02/2022   Flu Shot  04/16/2022   Tetanus Vaccine  08/17/2031   Colon Cancer Screening  02/22/2032   HPV Vaccine  Aged Out

## 2022-02-27 NOTE — Progress Notes (Signed)
Subjective:   Rebekah [name] is a 66 y.o. female who presents for Medicare Annual (Subsequent) preventive examination.  Review of Systems    Review of Systems  Constitutional:  Negative for malaise/fatigue.  Respiratory:  Negative for cough and shortness of breath.   Cardiovascular:  Negative for chest pain, palpitations and leg swelling.  Gastrointestinal:  Negative for abdominal pain, constipation and heartburn.  Musculoskeletal:  Negative for back pain, joint pain and myalgias.  Skin: Negative.   Neurological:  Negative for dizziness.  Psychiatric/Behavioral:  The patient is not nervous/anxious.     Cardiac Risk Factors include: advanced age (>14mn, >>26women);sedentary lifestyle     Objective:    Today's Vitals   02/27/22 1444  BP: 95/65  Pulse: 70  Temp: 98.5 F (36.9 C)  Weight: 130 lb 3.2 oz (59.1 kg)  Height: '5\' 6"'$  (1.676 m)   Body mass index is 21.01 kg/m.     02/21/2022    1:18 PM 02/18/2022    4:38 PM 02/14/2022   12:18 PM 02/13/2022    8:05 AM 01/28/2022    2:42 PM 01/15/2022   12:20 PM 01/09/2022    8:59 AM  Advanced Directives  Does Patient Have a Medical Advance Directive? No No No No No No No  Does patient want to make changes to medical advance directive?  No - Patient declined No - Patient declined No - Patient declined     Would patient like information on creating a medical advance directive? No - Patient declined     No - Patient declined No - Patient declined    Current Medications (verified) Outpatient Encounter Medications as of 02/27/2022  Medication Sig   acetaminophen (TYLENOL) 325 MG tablet Take 650 mg by mouth every 6 (six) hours as needed.   aspirin 81 MG chewable tablet Chew by mouth daily.   atenolol (TENORMIN) 25 MG tablet Take 12.5 mg by mouth daily.   Balsam Peru-Castor Oil (VENELEX) OINT Apply topically. Special Instructions: Apply to sacrum, bilateral buttocks and coccyx qshift for prevention. Every Shift   dronabinol (MARINOL)  2.5 MG capsule Take 1 capsule (2.5 mg total) by mouth daily before lunch.   Ensure Plus (ENSURE PLUS) LIQD Take 237 mLs by mouth 2 (two) times daily between meals.   levETIRAcetam (KEPPRA) 750 MG tablet Take 750 mg by mouth 2 (two) times daily.   levothyroxine (SYNTHROID) 50 MCG tablet Take 1 tablet (50 mcg total) by mouth daily. Take on an empty stomach   omega-3 acid ethyl esters (LOVAZA) 1 g capsule Take 1 g by mouth 2 (two) times daily.   ondansetron (ZOFRAN-ODT) 4 MG disintegrating tablet Take 4 mg by mouth every 8 (eight) hours as needed for nausea or vomiting.   pantoprazole (PROTONIX) 40 MG tablet Take 40 mg by mouth 2 (two) times daily.   sennosides-docusate sodium (SENOKOT-S) 8.6-50 MG tablet Take 1 tablet by mouth 2 (two) times daily.   sertraline (ZOLOFT) 100 MG tablet Take 100 mg by mouth daily.   ticagrelor (BRILINTA) 90 MG TABS tablet Take 90 mg by mouth 2 (two) times daily.   UNABLE TO FIND Diet: Regular diet with thin liquids   No facility-administered encounter medications on file as of 02/27/2022.    Allergies (verified) Patient has no known allergies.   History: Past Medical History:  Diagnosis Date   Arthritis    Rheumatoid   Dysphagia    post CVA   Hypertension    Lupus (systemic lupus erythematosus) (  [place])    Seizure ([place])    Stroke [facility])    Past Surgical History:  Procedure Laterality Date   CESAREAN SECTION     Family History  Problem Relation Age of Onset   Diabetes Mother    Hypertension Mother    Cancer Mother    Stroke Mother    Colon cancer Father    Hypertension Sister    Hypertension Sister    Hypertension Sister    Hypertension Sister    Hypertension Sister    Hypertension Sister    Hypertension Brother    Hypertension Brother    Social History   Socioeconomic History   Marital status: Single    Spouse name: Not on file   Number of children: Not on file   Years of education: Not on file   Highest education level: Not on file   Occupational History   Not on file  Tobacco Use   Smoking status: Never   Smokeless tobacco: Never  Vaping Use   Vaping Use: Never used  Substance and Sexual Activity   Alcohol use: No    Alcohol/week: 0.0 standard drinks of alcohol   Drug use: No   Sexual activity: Not on file  Other Topics Concern   Not on file  Social History Narrative   Lives with sister.     Social Determinants of Health   Financial Resource Strain: Not on file  Food Insecurity: Not on file  Transportation Needs: Not on file  Physical Activity: Not on file  Stress: Not on file  Social Connections: Not on file    Tobacco Counseling Counseling given: Not Answered   Clinical Intake:  Pre-visit preparation completed: Yes  Pain : No/denies pain     Nutritional Status: BMI of 19-24  Normal Nutritional Risks: Unintentional weight loss Diabetes: No  How often do you need to have someone help you when you read instructions, pamphlets, or other written materials from your doctor or pharmacy?: [place] Needed?: No      Activities of Daily Living    02/27/2022    2:51 PM  In your present state of health, do you have any difficulty performing the following activities:  Hearing? 0  Vision? 0  Difficulty concentrating or making decisions? 0  Walking or climbing stairs? 1  Dressing or bathing? 1  Doing errands, shopping? 1  Preparing Food and eating ? Y  Using the Toilet? Y  In the past six months, have you accidently leaked urine? Y  Do you have problems with loss of bowel control? N  Managing your Medications? Y  Managing your Finances? Y  Housekeeping or managing your Housekeeping? Y    Patient Care Team: Gerlene Fee, NP as PCP - General (Geriatric Medicine) Minus Breeding, MD as PCP - Cardiology ([facility], [place] ([place])  Indicate any recent [place] you may have received from other than [facility] providers  in the past year (date may be approximate).     Assessment:   This is a routine wellness examination for Rebekah [name].  Hearing/Vision screen No results found.  Dietary issues and exercise activities discussed: Current Exercise Habits: The patient does not participate in regular exercise at present, Exercise limited by: None identified   Goals Addressed             This Visit's Progress    Absence of Fall and Fall-Related Injury       Evidence-based guidance:  Assess fall risk using a validated tool when available. Consider balance and gait impairment, muscle weakness, diminished vision or hearing, environmental hazards, presence of urinary or bowel urgency and/or incontinence.  Communicate fall injury risk to interprofessional healthcare team.  Develop a fall prevention plan with the patient and family.  Promote use of personal vision and auditory aids.  Promote reorientation, appropriate sensory stimulation, and routines to decrease risk of fall when changes in mental status are present.  Assess assistance level required for safe and effective self-care; consider referral for home care.  Encourage physical activity, such as performance of self-care at highest level of ability, strength and balance exercise program, and provision of appropriate assistive devices; refer to rehabilitation therapy.  Refer to community-based fall prevention program where available.  If fall occurs, determine the cause and revise fall injury prevention plan.  Regularly review medication contribution to fall risk; consider risk related to polypharmacy and age.  Refer to pharmacist for consultation when concerns about medications are revealed.  Balance adequate pain management with potential for oversedation.  Provide guidance related to environmental modifications.  Consider supplementation with Vitamin D.   Notes:      DIET - INCREASE WATER INTAKE       General - Client will not be readmitted within 30  days ([facility])       Have 3 meals a day         Depression Screen    02/27/2022    2:51 PM 02/18/2022    1:53 PM 11/13/2021    3:35 PM 09/26/2021   10:10 AM 09/26/2021   10:05 AM 07/16/2021    3:53 PM 05/15/2021   11:12 AM  PHQ 2/9 Scores  PHQ - 2 Score 0 0 0 3 0 2 1  PHQ- 9 Score   0 11  8     Fall Risk    02/27/2022    2:49 PM 02/18/2022    1:53 PM 11/13/2021    3:34 PM 09/26/2021   10:13 AM 09/26/2021   10:05 AM  [place] in the past year? 0 0 0 1 0  Number falls in past yr: 0 0 0 0   Injury with Fall? 0 0 0 1   Risk for fall due to : Impaired balance/gait;Impaired mobility Impaired balance/gait;Impaired mobility No Fall Risks History of fall(s)   Follow up    Falls evaluation completed     FALL RISK PREVENTION PERTAINING TO THE HOME:  Any stairs in or around the home? Yes  If so, are there any without handrails? No  Home free of loose throw rugs in walkways, pet beds, electrical cords, etc? Yes  Adequate lighting in your home to reduce risk of falls? Yes   ASSISTIVE DEVICES UTILIZED TO PREVENT FALLS:  Life alert? No  Use of a cane, walker or w/c? Yes  Grab bars in the bathroom? Yes  Shower chair or bench in shower? Yes  Elevated toilet seat or a handicapped toilet? Yes   TIMED UP AND GO:  Was the test performed? Yes .  Length of time to ambulate 10 feet: 10 sec.   Gait unsteady without use of assistive device, provider informed and interventions were implemented  Cognitive Function:    02/27/2022    2:52 PM  MMSE - Mini Mental State Exam  Orientation to time 5  Orientation to Place 5  Registration 3  Attention/ Calculation 5  Recall 3  Language- name 2 objects 2  Language- repeat 1  Language- follow 3 step command 3  Language- read & follow direction 1  Write a sentence 0  Copy design 0  Total score 28        02/27/2022    2:53 PM  6CIT Screen  What Year? 0 points  What month? 0 points  What time? 0 points  Count back from 20 2 points   Months in reverse 2 points  Repeat phrase 4 points  Total Score 8 points    Immunizations Immunization History  Administered Date(s) Administered   PFIZER(Purple Top)SARS-COV-2 Vaccination 05/10/2020, 05/31/2020   Pfizer Covid-19 Vaccine Bivalent Booster 12yr & up 02/05/2022   Tdap 08/16/2021    TDAP status: Due, Education has been provided regarding the importance of this vaccine. Advised may receive this vaccine at local pharmacy or Health Dept. Aware to provide a copy of the vaccination record if obtained from local pharmacy or Health Dept. Verbalized acceptance and understanding.  Flu Vaccine status: Up to date  Pneumococcal vaccine status: Due, Education has been provided regarding the importance of this vaccine. Advised may receive this vaccine at local pharmacy or Health Dept. Aware to provide a copy of the vaccination record if obtained from local pharmacy or Health Dept. Verbalized acceptance and understanding.  Covid-19 vaccine status: Completed vaccines  Qualifies for Shingles Vaccine? Yes   Zostavax completed No   Shingrix Completed?: No.    Education has been provided regarding the importance of this vaccine. Patient has been advised to call insurance company to determine out of pocket expense if they have not yet received this vaccine. Advised may also receive vaccine at local pharmacy or Health Dept. Verbalized acceptance and understanding.  Screening Tests Health Maintenance  Topic Date Due   Hepatitis C Screening  Never done   Zoster Vaccines- Shingrix (1 of 2) Never done   MAMMOGRAM  Never done   Pneumonia Vaccine 67 Years old (1 - PCV) Never done   DEXA SCAN  Never done   COVID-19 Vaccine (4 - Booster for Pfizer series) 04/02/2022   INFLUENZA VACCINE  04/16/2022   TETANUS/TDAP  08/17/2031   COLONOSCOPY (Pts 45-429yrInsurance coverage will need to be confirmed)  02/22/2032   HPV VACCINES  Aged Out    Health Maintenance  Health Maintenance Due  Topic  Date Due   Hepatitis C Screening  Never done   Zoster Vaccines- Shingrix (1 of 2) Never done   MAMMOGRAM  Never done   Pneumonia Vaccine [age]Years old (1 - PCV) Never done   DEXA SCAN  Never done    Colorectal cancer screening: Type of screening: Colonoscopy. Completed 02-21-22. Repeat every 10 years  Mammogram status: Ordered 02/27/22. Pt provided with contact info and advised to call to schedule appt.   Bone Density status: Ordered 02/27/22. Pt provided with contact info and advised to call to schedule appt.  Lung Cancer Screening: (Low Dose CT Chest recommended if Age 67-80ears, 30 pack-year currently smoking OR have quit w/in 15years.) does not qualify.   Lung Cancer Screening Referral: n/a  Additional Screening:  Hepatitis C Screening: does qualify; Completed ordered 02-27-22   Vision Screening: Recommended annual ophthalmology exams for early detection of glaucoma and other disorders of the eye. Is the patient up to date with their annual eye exam?  No  Who is the provider or what is the name of the office in which the patient attends annual eye exams?  If pt is not established with a  provider, would they like to be referred to a provider to establish care? No .   Dental Screening: Recommended annual dental exams for proper oral hygiene  Community Resource Referral / Chronic Care Management: CRR required this visit?  No   CCM required this visit?  No      Plan:     I have personally reviewed and noted the following in the patient's chart:   Medical and social history Use of alcohol, tobacco or illicit drugs  Current medications and supplements including opioid prescriptions.  Functional ability and status Nutritional status Physical activity Advanced directives List of other physicians Hospitalizations, surgeries, and ER visits in previous 12 months Vitals Screenings to include cognitive, depression, and falls Referrals and appointments  In addition, I have  reviewed and discussed with patient certain preventive protocols, quality metrics, and best practice recommendations. A written personalized care plan for preventive services as well as general preventive health recommendations were provided to patient.     Gerlene Fee, NP   02/27/2022   Nurse Notes: this test was performed at [facility] where she is a long resident of this facility

## 2022-02-28 ENCOUNTER — Other Ambulatory Visit (HOSPITAL_COMMUNITY): Payer: Self-pay | Admitting: Adult Health

## 2022-02-28 ENCOUNTER — Encounter (HOSPITAL_COMMUNITY): Payer: Self-pay | Admitting: Internal Medicine

## 2022-02-28 DIAGNOSIS — M85841 Other specified disorders of bone density and structure, right hand: Secondary | ICD-10-CM | POA: Diagnosis not present

## 2022-02-28 DIAGNOSIS — Z1231 Encounter for screening mammogram for malignant neoplasm of breast: Secondary | ICD-10-CM

## 2022-03-04 ENCOUNTER — Non-Acute Institutional Stay (SKILLED_NURSING_FACILITY): Payer: Medicare Other | Admitting: Adult Health

## 2022-03-04 ENCOUNTER — Encounter: Payer: Self-pay | Admitting: Adult Health

## 2022-03-04 ENCOUNTER — Other Ambulatory Visit: Payer: Self-pay | Admitting: Internal Medicine

## 2022-03-04 DIAGNOSIS — I69398 Other sequelae of cerebral infarction: Secondary | ICD-10-CM

## 2022-03-04 DIAGNOSIS — R569 Unspecified convulsions: Secondary | ICD-10-CM | POA: Diagnosis not present

## 2022-03-04 NOTE — Progress Notes (Unsigned)
Location:  [place] Room Number: 157-W Place of Service:  SNF (31)   CODE STATUS: Full Code  No Known Allergies  Chief Complaint  Patient presents with   Acute Visit    Seizures     HPI:    Past Medical History:  Diagnosis Date   Arthritis    Rheumatoid   Dysphagia    post CVA   Hypertension    Lupus (systemic lupus erythematosus) ([place])    Seizure ([place])    Stroke [facility])     Past Surgical History:  Procedure Laterality Date   BIOPSY  02/21/2022   Procedure: BIOPSY;  Surgeon: [name], DO;  Location: [facility] SU[facility];  Service: Endoscopy;;   CESAREAN SECTION     COLONOSCOPY WITH PROPOFOL N/A 02/21/2022   Procedure: COLONOSCOPY WITH PROPOFOL;  Surgeon: [name], DO;  Location: [facility];  Service: Endoscopy;  Laterality: N/A;  2:00pm   ESOPHAGEAL BRUSHING  02/21/2022   Procedure: ESOPHAGEAL BRUSHING;  Surgeon: [name], DO;  [facility];  Service: Endoscopy;;   ESOPHAGOGASTRODUODENOSCOPY (EGD) WITH PROPOFOL N/A 02/21/2022   Procedure: ESOPHAGOGASTRODUODENOSCOPY (EGD) WITH PROPOFOL;  Surgeon: [name], DO;  [facility];  Service: Endoscopy;  Laterality: N/A;   POLYPECTOMY  02/21/2022   Procedure: POLYPECTOMY;  Surgeon: [name], DO;  [facility];  Service: Endoscopy;;    Social History   Socioeconomic History   Marital status: Single    Spouse name: Not on file   Number of children: Not on file   Years of education: Not on file   Highest education level: Not on file  Occupational History   Not on file  Tobacco Use   Smoking status: Never   Smokeless tobacco: Never  Vaping Use   Vaping Use: Never used  Substance and Sexual Activity   Alcohol use: No    Alcohol/week: 0.0 standard drinks of alcohol   Drug use: No   Sexual activity: Not on file  Other Topics Concern   Not on file  Social History Narrative   Lives with sister.     Social Determinants of Health    Financial Resource Strain: Not on file  Food Insecurity: Not on file  Transportation Needs: Not on file  Physical Activity: Not on file  Stress: Not on file  Social Connections: Not on file  Intimate Partner Violence: Not on file   Family History  Problem Relation Age of Onset   Diabetes Mother    Hypertension Mother    Cancer Mother    Stroke Mother    Colon cancer Father    Hypertension Sister    Hypertension Sister    Hypertension Sister    Hypertension Sister    Hypertension Sister    Hypertension Sister    Hypertension Brother    Hypertension Brother       VITAL SIGNS BP (!) 99/55   Pulse 73   Temp 98.5 F (36.9 C)   Resp (!) 22   Ht '5\' 6"'$  (1.676 m)   Wt 130 lb 3.2 oz (59.1 kg)   SpO2 98%   BMI 21.01 kg/m   Outpatient Encounter Medications as of 03/04/2022  Medication Sig   acetaminophen (TYLENOL) 325 MG tablet Take 650 mg by mouth every 6 (six) hours as needed.   Amino Acids-Protein Hydrolys (FEEDING SUPPLEMENT, PRO-STAT SUGAR FREE 64,) LIQD Take 30 mLs by mouth 3 (three) times daily with meals.  aspirin 81 MG chewable tablet Chew by mouth daily.   atenolol (TENORMIN) 25 MG tablet Take 12.5 mg by mouth daily.   Balsam [place]-Castor Oil (VENELEX) OINT Apply topically. Special Instructions: Apply to sacrum, bilateral buttocks and coccyx qshift for prevention. Every Shift   Ensure Plus (ENSURE PLUS) LIQD Take 237 mLs by mouth 2 (two) times daily between meals.   fluconazole (DIFLUCAN) 100 MG tablet Take 100 mg by mouth daily. for esophageal thrush   levETIRAcetam (KEPPRA) 750 MG tablet Take 750 mg by mouth 2 (two) times daily.   levothyroxine (SYNTHROID) 50 MCG tablet Take 1 tablet (50 mcg total) by mouth daily. Take on an empty stomach   omega-3 acid ethyl esters (LOVAZA) 1 g capsule Take 1 g by mouth 2 (two) times daily.   ondansetron (ZOFRAN-ODT) 4 MG disintegrating tablet Take 4 mg by mouth every 8 (eight) hours as needed for nausea or vomiting.    pantoprazole (PROTONIX) 40 MG tablet Take 40 mg by mouth 2 (two) times daily.   sennosides-docusate sodium (SENOKOT-S) 8.6-50 MG tablet Take 1 tablet by mouth 2 (two) times daily.   sertraline (ZOLOFT) 100 MG tablet Take 100 mg by mouth daily.   ticagrelor (BRILINTA) 90 MG TABS tablet Take 90 mg by mouth 2 (two) times daily.   UNABLE TO FIND Diet: Regular diet with thin liquids   No facility-administered encounter medications on file as of 03/04/2022.     SIGNIFICANT DIAGNOSTIC EXAMS       ASSESSMENT/ PLAN:     Rebekah Edwards NP [facility]  Contact [phone] Monday through Friday 8am- 5pm  After hours call [phone]

## 2022-03-05 ENCOUNTER — Encounter: Payer: Self-pay | Admitting: Adult Health

## 2022-03-05 ENCOUNTER — Other Ambulatory Visit (HOSPITAL_COMMUNITY)
Admission: RE | Admit: 2022-03-05 | Discharge: 2022-03-05 | Disposition: A | Discharge status: Home / Self Care | Payer: Medicare Other | Source: Skilled Nursing Facility | Attending: Adult Health | Admitting: Adult Health

## 2022-03-05 ENCOUNTER — Non-Acute Institutional Stay (SKILLED_NURSING_FACILITY): Payer: Medicare Other | Admitting: Internal Medicine

## 2022-03-05 ENCOUNTER — Encounter: Payer: Self-pay | Admitting: Internal Medicine

## 2022-03-05 DIAGNOSIS — D509 Iron deficiency anemia, unspecified: Secondary | ICD-10-CM

## 2022-03-05 DIAGNOSIS — B3781 Candidal esophagitis: Secondary | ICD-10-CM | POA: Insufficient documentation

## 2022-03-05 DIAGNOSIS — R569 Unspecified convulsions: Secondary | ICD-10-CM | POA: Diagnosis not present

## 2022-03-05 DIAGNOSIS — K5790 Diverticulosis of intestine, part unspecified, without perforation or abscess without bleeding: Secondary | ICD-10-CM | POA: Diagnosis not present

## 2022-03-05 DIAGNOSIS — R0989 Other specified symptoms and signs involving the circulatory and respiratory systems: Secondary | ICD-10-CM | POA: Insufficient documentation

## 2022-03-05 DIAGNOSIS — K635 Polyp of colon: Secondary | ICD-10-CM | POA: Diagnosis not present

## 2022-03-05 DIAGNOSIS — I10 Essential (primary) hypertension: Secondary | ICD-10-CM | POA: Diagnosis not present

## 2022-03-05 NOTE — Patient Instructions (Signed)
See assessment and plan under each diagnosis in the problem list and acutely for this visit 

## 2022-03-05 NOTE — Progress Notes (Signed)
NURSING HOME LOCATION: [facility] ROOM NUMBER: 157 W CODE STATUS: Full code  PCP: Ok Edwards, NP  This is a nursing facility follow up visit of chronic medical diagnoses & to document compliance with Regulation 483.30 (c) in The [place] Manual Phase 2 which mandates caregiver visit ( visits can alternate among physician, PA or NP as per statutes) within 10 days of 30 days / 60 days/ 90 days post admission to SNF date    Interim medical record and care since last SNF visit was updated with review of diagnostic studies and change in clinical status since last visit were documented.  HPI: Rebekah [name] is a permanent resident this facility with medical diagnoses of rheumatoid arthritis, SLE, essential hypertension, protein/caloric malnutrition, dyslipidemia, history of chronic anemia, and history of stroke.  Staff reported seizure activity on 6/18 for which the NP saw her on 6/19.  Keppra level is pending. Diflucan has been extended 5 days for esophagitis due to candidiasis documented at EGD on 02/21/2022.  Gastritis was also documented.  Colonoscopy was also performed on that date and revealed diverticulosis in the sigmoid colon.  One 8 mm polyp was present at the appendiceal orifice which was removed with a cold snare.  The portion of the ileum examined was normal. Zofran 4 mg before meals has been ordered for nausea. Comprehensive labs were performed 6/5 and revealed albumin 1.9, total protein 5.8, potassium of 3, and GFR greater than 60.  H/H was 7.7/24.7 in the context of Brilinta therapy.  Repeat H/H on 6/6 revealed values of 8.3/26.  Indices were normochromic, normocytic.Hypokalemia  progressed to 2.6 but was corrected.  Review of systems: Her concern is "still trouble with my stomach.".  Rebekah [name] went on to elaborate "I do not want it to happen again. stopping eating.".  Rebekah [name] validates dyspepsia and some dysphagia manifested as food boli stopping at the upper chest.  Rebekah [name]  states that Rebekah [name] continues to throw up each day.  Despite the marked anemia; Rebekah [name] denies any bleeding dyscrasias.  Constitutional: No fever, significant weight change  Eyes: No redness, discharge, pain, vision change ENT/mouth: No nasal congestion,  purulent discharge, earache, change in hearing, sore throat  Cardiovascular: No chest pain, palpitations, paroxysmal nocturnal dyspnea, claudication, edema  Respiratory: No cough, sputum production, hemoptysis, DOE, significant snoring, apnea   Gastrointestinal: No abdominal pain, rectal bleeding, melena, change in bowels Genitourinary: No dysuria, hematuria, pyuria, incontinence, nocturia Musculoskeletal: No joint stiffness, joint swelling, weakness, pain Dermatologic: No rash, pruritus, change in appearance of skin Neurologic: No dizziness, headache, syncope, seizures, numbness, tingling Psychiatric: No significant anxiety, depression, insomnia, anorexia Endocrine: No change in hair/skin/nails, excessive thirst, excessive hunger, excessive urination  Hematologic/lymphatic: No significant bruising, lymphadenopathy, abnormal bleeding  Physical exam:  Pertinent or positive findings: Rebekah [name] is thin and appears suboptimally nourished.  Temporal wasting is suggested.  Initially Rebekah [name] was asleep but exhibited no snoring, hypopnea, or frank apnea.  Upon awakening Rebekah [name] was communicative and polite.  Eyebrows are decreased laterally.  There is no facial wrinkling.  The lacrimal glands are prominent especially on the left.  The anterior maxillary teeth are fractured.  There is suggestion of Candida of the posterior dorsal tongue.  There is slight splitting of the second heart sound.  The pulse is bounding over the aorta.  It is easily palpable.  There is no definite AAA.  Pedal pulses are also strong.  Rebekah [name] has clubbing of the nailbeds.  There is interosseous wasting of the  hands.  General appearance: no acute distress, increased work of breathing is present.    Lymphatic: No lymphadenopathy about the head, neck, axilla. Eyes: No conjunctival inflammation or lid edema is present. There is no scleral icterus. Ears:  External ear exam shows no significant lesions or deformities.   Nose:  External nasal examination shows no deformity or inflammation. Nasal mucosa are pink and moist without lesions, exudates Neck:  No thyromegaly, masses, tenderness noted.    Heart:  Normal rate and regular rhythm. S1 normal without gallop, murmur, click, rub .  Lungs: Chest clear to auscultation without wheezes, rhonchi, rales, rubs. Abdomen: Bowel sounds are normal. Abdomen is soft and nontender with no organomegaly, hernias, masses. GU: Deferred  Extremities:  No cyanosis, edema  Neurologic exam :Balance, Rhomberg, finger to nose testing could not be completed due to clinical state Skin: Warm & dry w/o tenting. No significant lesions or rash.  See summary under each active problem in the Problem List with associated updated therapeutic plan

## 2022-03-05 NOTE — Assessment & Plan Note (Signed)
NP has extended the Diflucan additional 5 days.

## 2022-03-05 NOTE — Assessment & Plan Note (Signed)
EGD and colonoscopy performed 6/8 revealed no etiology for the profound anemia.  She denies any active bleeding dyscrasias at this time.  H/H is relatively stable but monitor will continue.

## 2022-03-05 NOTE — Progress Notes (Signed)
Location:  [place] Room Number: [place] of Service:  SNF (31)   CODE STATUS: Full Code  No Known Allergies  Chief Complaint  Patient presents with   Acute Visit    Nausea and vomiting    HPI:    Past Medical History:  Diagnosis Date   Arthritis    Rheumatoid   Dysphagia    post CVA   Hypertension    Lupus (systemic lupus erythematosus) ([place])    Seizure ([place])    Stroke [facility])     Past Surgical History:  Procedure Laterality Date   BIOPSY  02/21/2022   Procedure: BIOPSY;  Surgeon: [name], DO;  Location: [facility] ENDO SU[facility];  Service: Endoscopy;;   CESAREAN SECTION     COLONOSCOPY WITH PROPOFOL N/A 02/21/2022   Procedure: COLONOSCOPY WITH PROPOFOL;  Surgeon: [name], DO;  Location: [facility] ENDO SU[facility];  Service: Endoscopy;  Laterality: N/A;  2:00pm   ESOPHAGEAL BRUSHING  02/21/2022   Procedure: ESOPHAGEAL BRUSHING;  Surgeon: [name], DO;  Location: [facility];  Service: Endoscopy;;   ESOPHAGOGASTRODUODENOSCOPY (EGD) WITH PROPOFOL N/A 02/21/2022   Procedure: ESOPHAGOGASTRODUODENOSCOPY (EGD) WITH PROPOFOL;  Surgeon: [name], DO;  Location: [facility]O [facility];  Service: Endoscopy;  Laterality: N/A;   POLYPECTOMY  02/21/2022   Procedure: POLYPECTOMY;  Surgeon: [name], DO;  Location: [facility];  Service: Endoscopy;;    Social History   Socioeconomic History   Marital status: Single    Spouse name: Not on file   Number of children: Not on file   Years of education: Not on file   Highest education level: Not on file  Occupational History   Not on file  Tobacco Use   Smoking status: Never   Smokeless tobacco: Never  Vaping Use   Vaping Use: Never used  Substance and Sexual Activity   Alcohol use: No    Alcohol/week: 0.0 standard drinks of alcohol   Drug use: No   Sexual activity: Not on file  Other Topics Concern   Not on file  Social History Narrative   Lives with sister.     Social Determinants  of Health   Financial Resource Strain: Not on file  Food Insecurity: Not on file  Transportation Needs: Not on file  Physical Activity: Not on file  Stress: Not on file  Social Connections: Not on file  Intimate Partner Violence: Not on file   Family History  Problem Relation Age of Onset   Diabetes Mother    Hypertension Mother    Cancer Mother    Stroke Mother    Colon cancer Father    Hypertension Sister    Hypertension Sister    Hypertension Sister    Hypertension Sister    Hypertension Sister    Hypertension Sister    Hypertension Brother    Hypertension Brother       VITAL SIGNS BP (!) 99/55   Pulse 73   Temp 98.5 F (36.9 C)   Resp (!) 22   Ht '5\' 6"'$  (1.676 m)   Wt 130 lb 3.2 oz (59.1 kg)   SpO2 98%   BMI 21.01 kg/m   Outpatient Encounter Medications as of 03/05/2022  Medication Sig   acetaminophen (TYLENOL) 325 MG tablet Take 650 mg by mouth every 6 (six) hours as needed.   Amino Acids-Protein Hydrolys (FEEDING SUPPLEMENT, PRO-STAT SUGAR FREE 64,) LIQD Take 30 mLs by mouth 3 (three) times daily with  meals.   aspirin 81 MG chewable tablet Chew by mouth daily.   atenolol (TENORMIN) 25 MG tablet Take 12.5 mg by mouth daily.   Balsam [place]-Castor Oil (VENELEX) OINT Apply topically. Special Instructions: Apply to sacrum, bilateral buttocks and coccyx qshift for prevention. Every Shift   Ensure Plus (ENSURE PLUS) LIQD Take 237 mLs by mouth 2 (two) times daily between meals.   fluconazole (DIFLUCAN) 100 MG tablet Take 100 mg by mouth daily. for esophageal thrush   levETIRAcetam (KEPPRA) 750 MG tablet Take 750 mg by mouth 2 (two) times daily.   levothyroxine (SYNTHROID) 50 MCG tablet Take 1 tablet (50 mcg total) by mouth daily. Take on an empty stomach   omega-3 acid ethyl esters (LOVAZA) 1 g capsule Take 1 g by mouth 2 (two) times daily.   ondansetron (ZOFRAN-ODT) 4 MG disintegrating tablet Take 4 mg by mouth every 8 (eight) hours as needed for nausea or vomiting.    pantoprazole (PROTONIX) 40 MG tablet Take 40 mg by mouth 2 (two) times daily.   sennosides-docusate sodium (SENOKOT-S) 8.6-50 MG tablet Take 1 tablet by mouth 2 (two) times daily.   sertraline (ZOLOFT) 100 MG tablet Take 100 mg by mouth daily.   ticagrelor (BRILINTA) 90 MG TABS tablet Take 90 mg by mouth 2 (two) times daily.   UNABLE TO FIND Diet: Regular diet with thin liquids   No facility-administered encounter medications on file as of 03/05/2022.     SIGNIFICANT DIAGNOSTIC EXAMS       ASSESSMENT/ PLAN:     Ok Edwards NP [facility]  Contact [phone] Monday through Friday 8am- 5pm  After hours call [phone]

## 2022-03-05 NOTE — Progress Notes (Signed)
This encounter was created in error - please disregard.

## 2022-03-05 NOTE — Assessment & Plan Note (Signed)
Blood pressures have been soft with a range of 94/58 up to a high of 110/70.  Beta-blocker will be discontinued if she is symptomatic.

## 2022-03-05 NOTE — Assessment & Plan Note (Addendum)
See exam 03/05/2022, aorta is easily palpable and exhibits a bounding pulse.  Clinically I cannot definitely rule out AAA.  I find no abdominal imaging in her past medical record.  Ultrasound will be performed.

## 2022-03-06 ENCOUNTER — Other Ambulatory Visit: Payer: Self-pay | Admitting: Adult Health

## 2022-03-06 ENCOUNTER — Other Ambulatory Visit (HOSPITAL_COMMUNITY): Payer: Self-pay | Admitting: Adult Health

## 2022-03-06 DIAGNOSIS — I714 Abdominal aortic aneurysm, without rupture, unspecified: Secondary | ICD-10-CM

## 2022-03-06 LAB — LEVETIRACETAM LEVEL: Levetiracetam Lvl: 42 ug/mL — ABNORMAL HIGH (ref 10.0–40.0)

## 2022-03-11 ENCOUNTER — Ambulatory Visit (HOSPITAL_COMMUNITY)
Admission: RE | Admit: 2022-03-11 | Discharge: 2022-03-11 | Disposition: A | Discharge status: Home / Self Care | Payer: Medicare Other | Source: Ambulatory Visit | Attending: Adult Health | Admitting: Adult Health

## 2022-03-11 ENCOUNTER — Ambulatory Visit (HOSPITAL_COMMUNITY): Payer: Medicare Other

## 2022-03-11 DIAGNOSIS — I714 Abdominal aortic aneurysm, without rupture, unspecified: Secondary | ICD-10-CM | POA: Insufficient documentation

## 2022-03-11 DIAGNOSIS — Z1231 Encounter for screening mammogram for malignant neoplasm of breast: Secondary | ICD-10-CM | POA: Insufficient documentation

## 2022-03-12 ENCOUNTER — Ambulatory Visit (HOSPITAL_COMMUNITY)
Admission: RE | Admit: 2022-03-12 | Discharge: 2022-03-12 | Disposition: A | Discharge status: Home / Self Care | Payer: Medicare Other | Source: Ambulatory Visit | Attending: Adult Health | Admitting: Adult Health

## 2022-03-12 ENCOUNTER — Other Ambulatory Visit (HOSPITAL_COMMUNITY): Payer: Self-pay | Admitting: Adult Health

## 2022-03-12 DIAGNOSIS — Z136 Encounter for screening for cardiovascular disorders: Secondary | ICD-10-CM | POA: Diagnosis not present

## 2022-03-12 DIAGNOSIS — I714 Abdominal aortic aneurysm, without rupture, unspecified: Secondary | ICD-10-CM | POA: Diagnosis not present

## 2022-03-12 DIAGNOSIS — Z1231 Encounter for screening mammogram for malignant neoplasm of breast: Secondary | ICD-10-CM | POA: Diagnosis not present

## 2022-03-14 DIAGNOSIS — M6281 Muscle weakness (generalized): Secondary | ICD-10-CM | POA: Diagnosis not present

## 2022-03-14 DIAGNOSIS — R262 Difficulty in walking, not elsewhere classified: Secondary | ICD-10-CM | POA: Diagnosis not present

## 2022-03-15 DIAGNOSIS — R262 Difficulty in walking, not elsewhere classified: Secondary | ICD-10-CM | POA: Diagnosis not present

## 2022-03-15 DIAGNOSIS — M6281 Muscle weakness (generalized): Secondary | ICD-10-CM | POA: Diagnosis not present

## 2022-03-16 DIAGNOSIS — R1312 Dysphagia, oropharyngeal phase: Secondary | ICD-10-CM | POA: Diagnosis not present

## 2022-03-16 DIAGNOSIS — M6281 Muscle weakness (generalized): Secondary | ICD-10-CM | POA: Diagnosis not present

## 2022-03-16 DIAGNOSIS — R262 Difficulty in walking, not elsewhere classified: Secondary | ICD-10-CM | POA: Diagnosis not present

## 2022-03-16 DIAGNOSIS — I69398 Other sequelae of cerebral infarction: Secondary | ICD-10-CM | POA: Diagnosis not present

## 2022-03-16 DIAGNOSIS — I69321 Dysphasia following cerebral infarction: Secondary | ICD-10-CM | POA: Diagnosis not present

## 2022-03-17 DIAGNOSIS — I69398 Other sequelae of cerebral infarction: Secondary | ICD-10-CM | POA: Diagnosis not present

## 2022-03-17 DIAGNOSIS — I69321 Dysphasia following cerebral infarction: Secondary | ICD-10-CM | POA: Diagnosis not present

## 2022-03-17 DIAGNOSIS — M6281 Muscle weakness (generalized): Secondary | ICD-10-CM | POA: Diagnosis not present

## 2022-03-17 DIAGNOSIS — R262 Difficulty in walking, not elsewhere classified: Secondary | ICD-10-CM | POA: Diagnosis not present

## 2022-03-17 DIAGNOSIS — R1312 Dysphagia, oropharyngeal phase: Secondary | ICD-10-CM | POA: Diagnosis not present

## 2022-03-18 ENCOUNTER — Non-Acute Institutional Stay (SKILLED_NURSING_FACILITY): Payer: Medicare Other | Admitting: Adult Health

## 2022-03-18 ENCOUNTER — Encounter: Payer: Self-pay | Admitting: Adult Health

## 2022-03-18 DIAGNOSIS — I69321 Dysphasia following cerebral infarction: Secondary | ICD-10-CM | POA: Diagnosis not present

## 2022-03-18 DIAGNOSIS — R262 Difficulty in walking, not elsewhere classified: Secondary | ICD-10-CM | POA: Diagnosis not present

## 2022-03-18 DIAGNOSIS — R1312 Dysphagia, oropharyngeal phase: Secondary | ICD-10-CM | POA: Diagnosis not present

## 2022-03-18 DIAGNOSIS — R569 Unspecified convulsions: Secondary | ICD-10-CM

## 2022-03-18 DIAGNOSIS — M6281 Muscle weakness (generalized): Secondary | ICD-10-CM | POA: Diagnosis not present

## 2022-03-18 DIAGNOSIS — I69398 Other sequelae of cerebral infarction: Secondary | ICD-10-CM | POA: Diagnosis not present

## 2022-03-18 NOTE — Progress Notes (Unsigned)
Location:  [place] Room Number: 157/W Place of Service:  SNF (31)   CODE STATUS: Full Code  No Known Allergies  Chief Complaint  Patient presents with   Acute Visit    Seizures     HPI:  Staff report that she has had a seizure over this past weekend. She did bite her tongue. She denies any pain in her tongue. She is presently taking keppra 750 mg twice daily.   Past Medical History:  Diagnosis Date   Arthritis    Rheumatoid   Dysphagia    post CVA   Hypertension    Lupus (systemic lupus erythematosus) ([place])    Seizure ([place])    Stroke [facility])     Past Surgical History:  Procedure Laterality Date   BIOPSY  02/21/2022   Procedure: BIOPSY;  Surgeon: [name], DO;  Location: [facility];  Service: Endoscopy;;   CESAREAN SECTION     COLONOSCOPY WITH PROPOFOL N/A 02/21/2022   Procedure: COLONOSCOPY WITH PROPOFOL;  Surgeon: [name], DO;  Location: [facility];  Service: Endoscopy;  Laterality: N/A;  2:00pm   ESOPHAGEAL BRUSHING  02/21/2022   Procedure: ESOPHAGEAL BRUSHING;  Surgeon: [name], DO;  [facility];  Service: Endoscopy;;   ESOPHAGOGASTRODUODENOSCOPY (EGD) WITH PROPOFOL N/A 02/21/2022   Procedure: ESOPHAGOGASTRODUODENOSCOPY (EGD) WITH PROPOFOL;  Surgeon: [name], DO;  Location: [facility];  Service: Endoscopy;  Laterality: N/A;   POLYPECTOMY  02/21/2022   Procedure: POLYPECTOMY;  Surgeon: [name], DO;  Location: [facility];  Service: Endoscopy;;    Social History   Socioeconomic History   Marital status: Single    Spouse name: Not on file   Number of children: Not on file   Years of education: Not on file   Highest education level: Not on file  Occupational History   Not on file  Tobacco Use   Smoking status: Never   Smokeless tobacco: Never  Vaping Use   Vaping Use: Never used  Substance and Sexual Activity   Alcohol use: No    Alcohol/week: 0.0 standard drinks of  alcohol   Drug use: No   Sexual activity: Not on file  Other Topics Concern   Not on file  Social History Narrative   Lives with sister.     Social Determinants of Health   Financial Resource Strain: Not on file  Food Insecurity: Not on file  Transportation Needs: Not on file  Physical Activity: Not on file  Stress: Not on file  Social Connections: Not on file  Intimate Partner Violence: Not on file   Family History  Problem Relation Age of Onset   Diabetes Mother    Hypertension Mother    Cancer Mother    Stroke Mother    Colon cancer Father    Hypertension Sister    Hypertension Sister    Hypertension Sister    Hypertension Sister    Hypertension Sister    Hypertension Sister    Hypertension Brother    Hypertension Brother       VITAL SIGNS BP (!) 135/56   Pulse 75   Temp (!) 97.3 F (36.3 C)   Resp 20   Ht '5\' 6"'$  (1.676 m)   Wt 130 lb 3.2 oz (59.1 kg)   SpO2 100%   BMI 21.01 kg/m   Outpatient Encounter Medications as of 03/18/2022  Medication Sig   acetaminophen (TYLENOL) 325 MG tablet Take  650 mg by mouth every 6 (six) hours as needed.   Amino Acids-Protein Hydrolys (FEEDING SUPPLEMENT, PRO-STAT SUGAR FREE 64,) LIQD Take 30 mLs by mouth 3 (three) times daily with meals.   aspirin 81 MG chewable tablet Chew by mouth daily.   atenolol (TENORMIN) 25 MG tablet Take 12.5 mg by mouth daily.   Balsam [place]-Castor Oil (VENELEX) OINT Apply topically. Special Instructions: Apply to sacrum, bilateral buttocks and coccyx qshift for prevention. Every Shift   Ensure Plus (ENSURE PLUS) LIQD Take 237 mLs by mouth 2 (two) times daily between meals.   lamoTRIgine (LAMICTAL) 25 MG tablet Take 25 mg by mouth daily.   levETIRAcetam (KEPPRA) 750 MG tablet Take 750 mg by mouth 2 (two) times daily.   levothyroxine (SYNTHROID) 50 MCG tablet Take 1 tablet (50 mcg total) by mouth daily. Take on an empty stomach   omega-3 acid ethyl esters (LOVAZA) 1 g capsule Take 1 g by mouth 2  (two) times daily.   ondansetron (ZOFRAN-ODT) 4 MG disintegrating tablet Take 4 mg by mouth every 8 (eight) hours as needed for nausea or vomiting.   pantoprazole (PROTONIX) 40 MG tablet Take 40 mg by mouth 2 (two) times daily.   sennosides-docusate sodium (SENOKOT-S) 8.6-50 MG tablet Take 1 tablet by mouth 2 (two) times daily.   sertraline (ZOLOFT) 100 MG tablet Take 100 mg by mouth daily.   ticagrelor (BRILINTA) 90 MG TABS tablet Take 90 mg by mouth 2 (two) times daily.   UNABLE TO FIND Diet: Regular diet with thin liquids   No facility-administered encounter medications on file as of 03/18/2022.     SIGNIFICANT DIAGNOSTIC EXAMS  LABS REVIEWED:   12-14-21; wbc 3.4; hgb 8.9; hct 29.2; plt 723; glucose 80; bun 20; creat 1.36; k+ 5.2; na++ 136; ca 8.7  12-18-21: wbc 4.3; hgb 8.7; hct 28.8; plt 710; glucose 83; bun 20; creat 1.36;  k+ 4.1; na++ 141; ca 8.8  01-10-22: wbc 4.5; hgb 8.0; hct 25.8; mcv 82.2 plt 603; glucose 76; bun 19; creat 1.28; k+ 3.6; na++ 1140; ca 8.5; gfr 46; protein 7.0; albumin 2.3; chol 98; ldl 48; trig 151; hdl 20; tsh 2.976 free t4: 1.00; iron 23; tibc 135 02-18-22: hgb 7.7; hct 24.7; glucose 47; bun 9; creat 0.80; k+ 3.0; na++ 136; ca 7.6; gfr>60; protein 5.8; albumin 1.9 02-19-22: wbc 3.2; hgb 8.3; hct 26.0; mcv 82.5 plt 141; k+ 2.6  02-20-22: k+ 4.7   TODAY  03-05-22: keppra level 42.0 (10-40 normal)  Review of Systems  Constitutional:  Negative for malaise/fatigue.  Respiratory:  Negative for cough and shortness of breath.   Cardiovascular:  Negative for chest pain, palpitations and leg swelling.  Gastrointestinal:  Negative for abdominal pain, constipation and heartburn.  Musculoskeletal:  Negative for back pain, joint pain and myalgias.  Skin: Negative.   Neurological:  Negative for dizziness.  Psychiatric/Behavioral:  The patient is not nervous/anxious.     Physical Exam Constitutional:      General: She is not in acute distress.    Appearance: She is  well-developed. She is not diaphoretic.  Neck:     Thyroid: No thyromegaly.  Cardiovascular:     Rate and Rhythm: Normal rate and regular rhythm.     Pulses: Normal pulses.     Heart sounds: Normal heart sounds.  Pulmonary:     Effort: Pulmonary effort is normal. No respiratory distress.     Breath sounds: Normal breath sounds.  Abdominal:     General: Bowel  sounds are normal. There is no distension.     Palpations: Abdomen is soft.     Tenderness: There is no abdominal tenderness.  Musculoskeletal:        General: Normal range of motion.     Cervical back: Neck supple.     Right lower leg: No edema.     Left lower leg: No edema.  Lymphadenopathy:     Cervical: No cervical adenopathy.  Skin:    General: Skin is warm and dry.  Neurological:     Mental Status: She is alert and oriented to person, place, and time.  Psychiatric:        Mood and Affect: Mood normal.       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    Ok Edwards NP [facility] Adult Medicine   call [phone]

## 2022-03-19 DIAGNOSIS — R262 Difficulty in walking, not elsewhere classified: Secondary | ICD-10-CM | POA: Diagnosis not present

## 2022-03-19 DIAGNOSIS — I69398 Other sequelae of cerebral infarction: Secondary | ICD-10-CM | POA: Diagnosis not present

## 2022-03-19 DIAGNOSIS — I69321 Dysphasia following cerebral infarction: Secondary | ICD-10-CM | POA: Diagnosis not present

## 2022-03-19 DIAGNOSIS — M6281 Muscle weakness (generalized): Secondary | ICD-10-CM | POA: Diagnosis not present

## 2022-03-19 DIAGNOSIS — R1312 Dysphagia, oropharyngeal phase: Secondary | ICD-10-CM | POA: Diagnosis not present

## 2022-03-20 DIAGNOSIS — R262 Difficulty in walking, not elsewhere classified: Secondary | ICD-10-CM | POA: Diagnosis not present

## 2022-03-20 DIAGNOSIS — I69398 Other sequelae of cerebral infarction: Secondary | ICD-10-CM | POA: Diagnosis not present

## 2022-03-20 DIAGNOSIS — R1312 Dysphagia, oropharyngeal phase: Secondary | ICD-10-CM | POA: Diagnosis not present

## 2022-03-20 DIAGNOSIS — I69321 Dysphasia following cerebral infarction: Secondary | ICD-10-CM | POA: Diagnosis not present

## 2022-03-20 DIAGNOSIS — M6281 Muscle weakness (generalized): Secondary | ICD-10-CM | POA: Diagnosis not present

## 2022-03-21 DIAGNOSIS — M6281 Muscle weakness (generalized): Secondary | ICD-10-CM | POA: Diagnosis not present

## 2022-03-21 DIAGNOSIS — R1312 Dysphagia, oropharyngeal phase: Secondary | ICD-10-CM | POA: Diagnosis not present

## 2022-03-21 DIAGNOSIS — I69398 Other sequelae of cerebral infarction: Secondary | ICD-10-CM | POA: Diagnosis not present

## 2022-03-21 DIAGNOSIS — R262 Difficulty in walking, not elsewhere classified: Secondary | ICD-10-CM | POA: Diagnosis not present

## 2022-03-21 DIAGNOSIS — I69321 Dysphasia following cerebral infarction: Secondary | ICD-10-CM | POA: Diagnosis not present

## 2022-03-25 DIAGNOSIS — M6281 Muscle weakness (generalized): Secondary | ICD-10-CM | POA: Diagnosis not present

## 2022-03-25 DIAGNOSIS — R1312 Dysphagia, oropharyngeal phase: Secondary | ICD-10-CM | POA: Diagnosis not present

## 2022-03-25 DIAGNOSIS — I69398 Other sequelae of cerebral infarction: Secondary | ICD-10-CM | POA: Diagnosis not present

## 2022-03-25 DIAGNOSIS — R262 Difficulty in walking, not elsewhere classified: Secondary | ICD-10-CM | POA: Diagnosis not present

## 2022-03-25 DIAGNOSIS — I69321 Dysphasia following cerebral infarction: Secondary | ICD-10-CM | POA: Diagnosis not present

## 2022-03-26 ENCOUNTER — Non-Acute Institutional Stay (SKILLED_NURSING_FACILITY): Payer: Medicare Other | Admitting: Adult Health

## 2022-03-26 ENCOUNTER — Encounter: Payer: Self-pay | Admitting: Adult Health

## 2022-03-26 ENCOUNTER — Other Ambulatory Visit (HOSPITAL_COMMUNITY)
Admission: RE | Admit: 2022-03-26 | Discharge: 2022-03-26 | Disposition: A | Discharge status: Home / Self Care | Payer: Medicare Other | Source: Skilled Nursing Facility | Attending: Adult Health | Admitting: Adult Health

## 2022-03-26 DIAGNOSIS — I69398 Other sequelae of cerebral infarction: Secondary | ICD-10-CM | POA: Diagnosis not present

## 2022-03-26 DIAGNOSIS — E876 Hypokalemia: Secondary | ICD-10-CM

## 2022-03-26 DIAGNOSIS — E039 Hypothyroidism, unspecified: Secondary | ICD-10-CM

## 2022-03-26 DIAGNOSIS — R1312 Dysphagia, oropharyngeal phase: Secondary | ICD-10-CM | POA: Diagnosis not present

## 2022-03-26 DIAGNOSIS — E43 Unspecified severe protein-calorie malnutrition: Secondary | ICD-10-CM

## 2022-03-26 DIAGNOSIS — R4189 Other symptoms and signs involving cognitive functions and awareness: Secondary | ICD-10-CM | POA: Diagnosis present

## 2022-03-26 DIAGNOSIS — R569 Unspecified convulsions: Secondary | ICD-10-CM

## 2022-03-26 DIAGNOSIS — M6281 Muscle weakness (generalized): Secondary | ICD-10-CM | POA: Diagnosis not present

## 2022-03-26 DIAGNOSIS — R262 Difficulty in walking, not elsewhere classified: Secondary | ICD-10-CM | POA: Diagnosis not present

## 2022-03-26 DIAGNOSIS — I69321 Dysphasia following cerebral infarction: Secondary | ICD-10-CM | POA: Diagnosis not present

## 2022-03-26 LAB — CBC WITH DIFFERENTIAL/PLATELET
Abs Immature Granulocytes: 0.03 10*3/uL (ref 0.00–0.07)
Basophils Absolute: 0 10*3/uL (ref 0.0–0.1)
Basophils Relative: 0 %
Eosinophils Absolute: 0 10*3/uL (ref 0.0–0.5)
Eosinophils Relative: 1 %
HCT: 26.3 % — ABNORMAL LOW (ref 36.0–46.0)
Hemoglobin: 8.2 g/dL — ABNORMAL LOW (ref 12.0–15.0)
Immature Granulocytes: 1 %
Lymphocytes Relative: 19 %
Lymphs Abs: 0.8 10*3/uL (ref 0.7–4.0)
MCH: 27.1 pg (ref 26.0–34.0)
MCHC: 31.2 g/dL (ref 30.0–36.0)
MCV: 86.8 fL (ref 80.0–100.0)
Monocytes Absolute: 0.2 10*3/uL (ref 0.1–1.0)
Monocytes Relative: 6 %
Neutro Abs: 3.1 10*3/uL (ref 1.7–7.7)
Neutrophils Relative %: 73 %
Platelets: 508 10*3/uL — ABNORMAL HIGH (ref 150–400)
RBC: 3.03 MIL/uL — ABNORMAL LOW (ref 3.87–5.11)
RDW: 18.8 % — ABNORMAL HIGH (ref 11.5–15.5)
WBC: 4.2 10*3/uL (ref 4.0–10.5)
nRBC: 0 % (ref 0.0–0.2)

## 2022-03-26 LAB — SEDIMENTATION RATE: Sed Rate: 82 mm/hr — ABNORMAL HIGH (ref 0–22)

## 2022-03-26 LAB — COMPREHENSIVE METABOLIC PANEL
ALT: 7 U/L (ref 0–44)
AST: 16 U/L (ref 15–41)
Albumin: 2.1 g/dL — ABNORMAL LOW (ref 3.5–5.0)
Alkaline Phosphatase: 62 U/L (ref 38–126)
Anion gap: 4 — ABNORMAL LOW (ref 5–15)
BUN: 9 mg/dL (ref 8–23)
CO2: 25 mmol/L (ref 22–32)
Calcium: 7.6 mg/dL — ABNORMAL LOW (ref 8.9–10.3)
Chloride: 107 mmol/L (ref 98–111)
Creatinine, Ser: 0.84 mg/dL (ref 0.44–1.00)
GFR, Estimated: >60 mL/min (ref >60)
Glucose, Bld: 78 mg/dL (ref 70–99)
Potassium: 2.4 mmol/L — CL (ref 3.5–5.1)
Sodium: 136 mmol/L (ref 135–145)
Total Bilirubin: 0.7 mg/dL (ref 0.3–1.2)
Total Protein: 6.5 g/dL (ref 6.5–8.1)

## 2022-03-26 LAB — TSH: TSH: 4.826 u[IU]/mL — ABNORMAL HIGH (ref 0.350–4.500)

## 2022-03-26 NOTE — Progress Notes (Unsigned)
Location:  [place] Room Number: 157-W Place of Service:  SNF (31)   CODE STATUS: DNR  No Known Allergies  Chief Complaint  Patient presents with   Acute Visit    Care plan meeting    HPI:  We have come together for her care plan meeting. Family present. She is giving mixed signals about her plan of care. She has told staff that she is wanting comfort care; but is saying differently to her family. She has not eaten a meal since 03-21-22; but her family is bringing in food. She is having nausea ;states when she see food she is unable to eat. Her k+ today is 2.4. she will need supplementation today. She was started on lamictal 25 mg twice daily to help with seizure control. More than likely this medication is causing her lack of appetite. She is having problems keeping her medications down. She has been treated for esophageal thrush. She will need a GI follow up. She is wanting to continue with her current plan of care. We have spent a great deal of time discussing her advanced directives. She does not want cpr done; she does not want a feeding tube.   Past Medical History:  Diagnosis Date   Arthritis    Rheumatoid   Dysphagia    post CVA   Hypertension    Lupus (systemic lupus erythematosus) ([place])    Seizure ([place])    Stroke [facility])     Past Surgical History:  Procedure Laterality Date   BIOPSY  02/21/2022   Procedure: BIOPSY;  Surgeon: [name], DO;  Location: [facility];  Service: Endoscopy;;   CESAREAN SECTION     COLONOSCOPY WITH PROPOFOL N/A 02/21/2022   Procedure: COLONOSCOPY WITH PROPOFOL;  Surgeon: [name], DO;  Location: [facility];  Service: Endoscopy;  Laterality: N/A;  2:00pm   ESOPHAGEAL BRUSHING  02/21/2022   Procedure: ESOPHAGEAL BRUSHING;  Surgeon: [name], DO;  Location: [facility];  Service: Endoscopy;;   ESOPHAGOGASTRODUODENOSCOPY (EGD) WITH PROPOFOL N/A 02/21/2022   Procedure: ESOPHAGOGASTRODUODENOSCOPY  (EGD) WITH PROPOFOL;  Surgeon: [name], DO;  Location: [facility];  Service: Endoscopy;  Laterality: N/A;   POLYPECTOMY  02/21/2022   Procedure: POLYPECTOMY;  Surgeon: [name], DO;  Location: [facility];  Service: Endoscopy;;    Social History   Socioeconomic History   Marital status: Single    Spouse name: Not on file   Number of children: Not on file   Years of education: Not on file   Highest education level: Not on file  Occupational History   Not on file  Tobacco Use   Smoking status: Never   Smokeless tobacco: Never  Vaping Use   Vaping Use: Never used  Substance and Sexual Activity   Alcohol use: No    Alcohol/week: 0.0 standard drinks of alcohol   Drug use: No   Sexual activity: Not on file  Other Topics Concern   Not on file  Social History Narrative   Lives with sister.     Social Determinants of Health   Financial Resource Strain: Not on file  Food Insecurity: Not on file  Transportation Needs: Not on file  Physical Activity: Not on file  Stress: Not on file  Social Connections: Not on file  Intimate Partner Violence: Not on file   Family History  Problem Relation Age of Onset   Diabetes Mother    Hypertension Mother  Cancer Mother    Stroke Mother    Colon cancer Father    Hypertension Sister    Hypertension Sister    Hypertension Sister    Hypertension Sister    Hypertension Sister    Hypertension Sister    Hypertension Brother    Hypertension Brother       VITAL SIGNS BP (!) 120/51   Pulse (!) 57   Temp 97.6 F (36.4 C)   Resp 16   Ht '5\' 6"'$  (1.676 m)   Wt 122 lb 6.4 oz (55.5 kg)   SpO2 95%   BMI 19.76 kg/m   Outpatient Encounter Medications as of 03/26/2022  Medication Sig   acetaminophen (TYLENOL) 325 MG tablet Take 650 mg by mouth every 6 (six) hours as needed.   aspirin 81 MG chewable tablet Chew by mouth daily.   atenolol (TENORMIN) 25 MG tablet Take 12.5 mg by mouth daily.   Balsam [place]-Castor Oil  (VENELEX) OINT Apply topically. Special Instructions: Apply to sacrum, bilateral buttocks and coccyx qshift for prevention. Every Shift   Ensure Plus (ENSURE PLUS) LIQD Take 237 mLs by mouth 2 (two) times daily between meals.   lacosamide (VIMPAT) 10 MG/ML oral solution Take 50 mg by mouth 2 (two) times daily. Unspecified convulsions   levETIRAcetam (KEPPRA) 750 MG tablet Take 750 mg by mouth 2 (two) times daily.   ondansetron (ZOFRAN-ODT) 4 MG disintegrating tablet Take 4 mg by mouth every 8 (eight) hours as needed for nausea or vomiting.   pantoprazole (PROTONIX) 40 MG tablet Take 40 mg by mouth 2 (two) times daily.   sennosides-docusate sodium (SENOKOT-S) 8.6-50 MG tablet Take 1 tablet by mouth 2 (two) times daily.   ticagrelor (BRILINTA) 90 MG TABS tablet Take 90 mg by mouth 2 (two) times daily.   UNABLE TO FIND Diet: Regular diet with thin liquids   [DISCONTINUED] Amino Acids-Protein Hydrolys (FEEDING SUPPLEMENT, PRO-STAT SUGAR FREE 64,) LIQD Take 30 mLs by mouth 3 (three) times daily with meals.   [DISCONTINUED] lamoTRIgine (LAMICTAL) 25 MG tablet Take 25 mg by mouth daily.   [DISCONTINUED] levothyroxine (SYNTHROID) 50 MCG tablet Take 1 tablet (50 mcg total) by mouth daily. Take on an empty stomach   [DISCONTINUED] omega-3 acid ethyl esters (LOVAZA) 1 g capsule Take 1 g by mouth 2 (two) times daily.   [DISCONTINUED] sertraline (ZOLOFT) 100 MG tablet Take 100 mg by mouth daily.   No facility-administered encounter medications on file as of 03/26/2022.     SIGNIFICANT DIAGNOSTIC EXAMS  LABS REVIEWED:   12-14-21; wbc 3.4; hgb 8.9; hct 29.2; plt 723; glucose 80; bun 20; creat 1.36; k+ 5.2; na++ 136; ca 8.7  12-18-21: wbc 4.3; hgb 8.7; hct 28.8; plt 710; glucose 83; bun 20; creat 1.36;  k+ 4.1; na++ 141; ca 8.8  01-10-22: wbc 4.5; hgb 8.0; hct 25.8; mcv 82.2 plt 603; glucose 76; bun 19; creat 1.28; k+ 3.6; na++ 1140; ca 8.5; gfr 46; protein 7.0; albumin 2.3; chol 98; ldl 48; trig 151; hdl 20;  tsh 2.976 free t4: 1.00; iron 23; tibc 135 02-18-22: hgb 7.7; hct 24.7; glucose 47; bun 9; creat 0.80; k+ 3.0; na++ 136; ca 7.6; gfr>60; protein 5.8; albumin 1.9 02-19-22: wbc 3.2; hgb 8.3; hct 26.0; mcv 82.5 plt 141; k+ 2.6  02-20-22: k+ 4.7  03-05-22: keppra level 42.0 (10-40 normal)  TODAY   03-26-22: wbc 4.2; hgb 8.2; hct 26.3; mcv 86.8 plt 508; glucose 78; bun 9; creat 0.84; k+ 2.4; na++ 136; ca  7.6; gfr>60; protein 6.5; albumin 2.1; tsh 4.826; sed rate: 82 ANA+   Review of Systems  Constitutional:  Negative for malaise/fatigue.  Respiratory:  Negative for cough and shortness of breath.   Cardiovascular:  Negative for chest pain, palpitations and leg swelling.  Gastrointestinal:  Positive for nausea and vomiting. Negative for abdominal pain, constipation and heartburn.  Musculoskeletal:  Negative for back pain, joint pain and myalgias.  Skin: Negative.   Neurological:  Negative for dizziness.  Psychiatric/Behavioral:  The patient is not nervous/anxious.    Physical Exam Constitutional:      General: She is not in acute distress.    Appearance: She is underweight. She is not diaphoretic.  Neck:     Thyroid: No thyromegaly.  Cardiovascular:     Rate and Rhythm: Normal rate and regular rhythm.     Pulses: Normal pulses.     Heart sounds: Normal heart sounds.  Pulmonary:     Effort: Pulmonary effort is normal. No respiratory distress.     Breath sounds: Normal breath sounds.  Abdominal:     General: Bowel sounds are normal. There is no distension.     Palpations: Abdomen is soft.     Tenderness: There is no abdominal tenderness.  Musculoskeletal:        General: Normal range of motion.     Cervical back: Neck supple.     Right lower leg: No edema.     Left lower leg: No edema.  Lymphadenopathy:     Cervical: No cervical adenopathy.  Skin:    General: Skin is warm and dry.  Neurological:     Mental Status: She is alert and oriented to person, place, and time.  Psychiatric:         Mood and Affect: Mood normal.       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 Adult Medicine  call [phone]

## 2022-03-27 ENCOUNTER — Other Ambulatory Visit: Payer: Self-pay | Admitting: Adult Health

## 2022-03-27 DIAGNOSIS — I69321 Dysphasia following cerebral infarction: Secondary | ICD-10-CM | POA: Diagnosis not present

## 2022-03-27 DIAGNOSIS — I69398 Other sequelae of cerebral infarction: Secondary | ICD-10-CM | POA: Diagnosis not present

## 2022-03-27 DIAGNOSIS — R262 Difficulty in walking, not elsewhere classified: Secondary | ICD-10-CM | POA: Diagnosis not present

## 2022-03-27 DIAGNOSIS — M6281 Muscle weakness (generalized): Secondary | ICD-10-CM | POA: Diagnosis not present

## 2022-03-27 DIAGNOSIS — R1312 Dysphagia, oropharyngeal phase: Secondary | ICD-10-CM | POA: Diagnosis not present

## 2022-03-27 LAB — ANA: Anti Nuclear Antibody (ANA): POSITIVE — AB

## 2022-03-27 MED ORDER — LACOSAMIDE 50 MG PO TABS: 50.0000 mg | ORAL_TABLET | Freq: Two times a day (BID) | ORAL | 0 refills | Status: DC

## 2022-03-28 ENCOUNTER — Encounter: Payer: Self-pay | Admitting: Internal Medicine

## 2022-03-28 ENCOUNTER — Non-Acute Institutional Stay (SKILLED_NURSING_FACILITY): Payer: Medicare Other | Admitting: Internal Medicine

## 2022-03-28 ENCOUNTER — Other Ambulatory Visit (HOSPITAL_COMMUNITY)
Admission: RE | Admit: 2022-03-28 | Discharge: 2022-03-28 | Disposition: A | Discharge status: Home / Self Care | Payer: Medicare Other | Source: Skilled Nursing Facility | Attending: Adult Health | Admitting: Adult Health

## 2022-03-28 DIAGNOSIS — E876 Hypokalemia: Secondary | ICD-10-CM | POA: Diagnosis not present

## 2022-03-28 DIAGNOSIS — Z872 Personal history of diseases of the skin and subcutaneous tissue: Secondary | ICD-10-CM | POA: Diagnosis not present

## 2022-03-28 DIAGNOSIS — R569 Unspecified convulsions: Secondary | ICD-10-CM

## 2022-03-28 DIAGNOSIS — E43 Unspecified severe protein-calorie malnutrition: Secondary | ICD-10-CM

## 2022-03-28 DIAGNOSIS — I69398 Other sequelae of cerebral infarction: Secondary | ICD-10-CM

## 2022-03-28 DIAGNOSIS — D649 Anemia, unspecified: Secondary | ICD-10-CM | POA: Diagnosis not present

## 2022-03-28 DIAGNOSIS — R262 Difficulty in walking, not elsewhere classified: Secondary | ICD-10-CM | POA: Diagnosis not present

## 2022-03-28 DIAGNOSIS — I69321 Dysphasia following cerebral infarction: Secondary | ICD-10-CM | POA: Diagnosis not present

## 2022-03-28 DIAGNOSIS — E039 Hypothyroidism, unspecified: Secondary | ICD-10-CM | POA: Diagnosis not present

## 2022-03-28 DIAGNOSIS — M6281 Muscle weakness (generalized): Secondary | ICD-10-CM | POA: Diagnosis not present

## 2022-03-28 DIAGNOSIS — R1312 Dysphagia, oropharyngeal phase: Secondary | ICD-10-CM | POA: Diagnosis not present

## 2022-03-28 LAB — POTASSIUM: Potassium: 4 mmol/L (ref 3.5–5.1)

## 2022-03-28 NOTE — Assessment & Plan Note (Signed)
TSH is minimally elevated; continue to monitor in 8-12 weeks.

## 2022-03-28 NOTE — Assessment & Plan Note (Addendum)
Vimpat has been added to the [place].  An abbreviated trial of low-dose glucocorticoids will be initiated as her sed rate is 82 and there is a possibility of lupus related vasculitis playing a role.  This also may help her appetite.

## 2022-03-28 NOTE — Patient Instructions (Signed)
See assessment and plan under each diagnosis in the problem list and acutely for this visit 

## 2022-03-28 NOTE — Assessment & Plan Note (Signed)
Repeat ANA is positive.  Sed rate is 82.  Short course of low-dose steroids as trial to treat any possible lupus associated vasculitis.

## 2022-03-28 NOTE — Assessment & Plan Note (Signed)
Staff reports no bleeding dyscrasias.  Normochromic, normocytic anemia is stable.

## 2022-03-28 NOTE — Assessment & Plan Note (Signed)
Albumin has dropped further to 2.1; total protein is low normal.  Short course of glucocorticoids will be initiated to treat any possible lupus associated vasculitis.  This may help improve appetite.

## 2022-03-28 NOTE — Progress Notes (Signed)
   NURSING HOME LOCATION:  [facility] ROOM NUMBER:  [id number] W  CODE STATUS:  DNR  PCP:  Ok Edwards NP  This is a nursing facility follow up visit for specific acute issue of recurrent seizures. Interim medical record and care since last SNF visit was updated with review of diagnostic studies and change in clinical status since last visit were documented.  HPI: She was working with PT for mobilization when she had seizure activity while sitting on the wheelchair.  This is manifested as a blank stare and tremor of the upper extremities, greater on the right.  There were no other manifestations of seizure activity such as stool or urinary incontinence.  During the absence type activity she was nonverbal.  After several minutes she did converse but could provide no meaningful history. She has been on Keppra and the level 6/20 was 42 with therapeutic levels of 10-40.  Vimpat has been initiated within the last 48 hours. Comprehensive labs were checked 7/11 and revealed a potassium of 2.4. Recheck pending following supllementation. Total protein was low normal at 6.5 and albumin reduced to 2.1.  Staff reports her appetite is poor which has prompted discontinuation of Lamictal. Normochromic, normocytic anemia is stable with H/H of 8.2/26.3. Sed rate was significantly elevated at 82; this is in the context of a history of RA as well as lupus. Mild hypothyroidism is present manifested by TSH of 4.826; prior value was 2.976 on 4/27.  Review of systems: Even after the absence seizure resolved she was essentially nonverbal with monosyllabic replies to all queries.  She stated that she was "okay."  Physical exam:  Pertinent or positive findings: She appears chronically ill.  She exhibits a wide-eyed stare despite being hypothyroid.  Eyebrows are absent.  She has fractures of the maxillary teeth.  Resting tachycardia is present.  Pedal pulses are decreased.  Strength could not be adequately  tested as she had difficulty following commands.  When I attempted to test strength in the right lower extremity, she raised the left leg.  She exhibited a course flexion type tremor of the right hand more so than the left.  General appearance: no acute distress, increased work of breathing is present.   Lymphatic: No lymphadenopathy about the head, neck, axilla. Eyes: No conjunctival inflammation or lid edema is present. There is no scleral icterus. Ears:  External ear exam shows no significant lesions or deformities.   Nose:  External nasal examination shows no deformity or inflammation. Nasal mucosa are pink and moist without lesions, exudates Neck:  No thyromegaly, masses, tenderness noted.    Heart:  No gallop, murmur, click, rub .  Lungs: Chest clear to auscultation without wheezes, rhonchi, rales, rubs. Abdomen: Bowel sounds are normal. Abdomen is soft and nontender with no organomegaly, hernias, masses. GU: Deferred  Extremities:  No cyanosis, clubbing, edema  Neurologic exam :Balance, Rhomberg, finger to nose testing could not be completed due to clinical state Skin: Warm & dry w/o tenting. No significant lesions or rash.  See summary under each active problem in the Problem List with associated updated therapeutic plan

## 2022-03-29 ENCOUNTER — Encounter: Payer: Self-pay | Admitting: Adult Health

## 2022-03-29 ENCOUNTER — Ambulatory Visit (INDEPENDENT_AMBULATORY_CARE_PROVIDER_SITE_OTHER): Payer: Medicare Other | Admitting: Gastroenterology

## 2022-03-29 ENCOUNTER — Telehealth: Payer: Self-pay | Admitting: Gastroenterology

## 2022-03-29 ENCOUNTER — Encounter: Payer: Self-pay | Admitting: Gastroenterology

## 2022-03-29 VITALS — BP 106/73 | HR 111 | Temp 97.3°F | Wt 122.4 lb

## 2022-03-29 DIAGNOSIS — E039 Hypothyroidism, unspecified: Secondary | ICD-10-CM | POA: Diagnosis not present

## 2022-03-29 DIAGNOSIS — I10 Essential (primary) hypertension: Secondary | ICD-10-CM | POA: Diagnosis not present

## 2022-03-29 DIAGNOSIS — R634 Abnormal weight loss: Secondary | ICD-10-CM | POA: Diagnosis not present

## 2022-03-29 DIAGNOSIS — R262 Difficulty in walking, not elsewhere classified: Secondary | ICD-10-CM | POA: Diagnosis not present

## 2022-03-29 DIAGNOSIS — M329 Systemic lupus erythematosus, unspecified: Secondary | ICD-10-CM | POA: Insufficient documentation

## 2022-03-29 DIAGNOSIS — R1312 Dysphagia, oropharyngeal phase: Secondary | ICD-10-CM | POA: Insufficient documentation

## 2022-03-29 DIAGNOSIS — D649 Anemia, unspecified: Secondary | ICD-10-CM | POA: Diagnosis not present

## 2022-03-29 DIAGNOSIS — I69321 Dysphasia following cerebral infarction: Secondary | ICD-10-CM | POA: Diagnosis not present

## 2022-03-29 DIAGNOSIS — M6281 Muscle weakness (generalized): Secondary | ICD-10-CM | POA: Diagnosis not present

## 2022-03-29 DIAGNOSIS — I69398 Other sequelae of cerebral infarction: Secondary | ICD-10-CM | POA: Diagnosis not present

## 2022-03-29 NOTE — Patient Instructions (Signed)
Please send copy of speech therapy swallowing evaluation if available to [phone].

## 2022-03-29 NOTE — Telephone Encounter (Signed)
Tammy, please request speech therapy records from [facility], looking for swallowing evaluation if available.

## 2022-03-29 NOTE — Progress Notes (Signed)
GI Office Note    Referring Provider: Gerlene Fee, NP Primary Care Physician:  Gerlene Fee, NP  Primary Gastroenterologist: Elon Alas. Abbey Chatters, DO   Chief Complaint   Chief Complaint  Patient presents with   Esophageal Candidiasis    History of Present Illness   Rebekah [name] is a 66 y.o. female presenting today for follow-up.  She was last seen in the office in June.  History of weight loss, poor appetite, nausea/vomiting, IDA with heme positive stool.  Other pertinent medical history including rheumatoid arthritis, chronic kidney disease, hypertension, lupus, previous strokes.  Patient presents with 2 sisters today.  Patient lived with her son up until her last stroke in 11/2021.  She was hospitalized for nearly a month at that time.  Prior to this, her sisters noted a decline in her weight and appetite since was first diagnosed with lupus in 2019 and having COVID in December 2020.  Lost her sense of taste.  In January 2020 she weighed 218 pounds.  She was hospitalized with pneumonia around that time and dropped down to 208 pounds.  She had her first stroke in August 2022.  In October 2022 she weighed 196 pounds.  January 2023 she weighed 180 pounds.  In February 2023 she weighed 170 pounds.  When she was admitted into [facility] center in April 2022 she weighed 142 pounds.  She has gradually lost weight and currently weighs 122 pounds.  Tried on Remeron and Marinol for anorexia.  Apparently she has been followed by speech at [facility], records not available.  In May, feeding tube discussed with family but opted against it.  According to note from March 26, 2022, she had not eaten a meal in 5 days, telling staff that she wanted comfort care but not the impression her family got from her.  Family bringing her food.  Issue of feeding tube addressed again but declined.     Today: States that when she sees food she loses her appetite.  She states she can chew food but it  seems to get bigger and hard to swallow.  Denies any associated painful swallowing or pain in the abdomen.  She says she is okay with swallowing liquids but her appetite is poor and makes it difficult.  She has regurgitated pills regularly.  According to family they are transitioning many of her medications to liquids.  Often has to spit food back out because she cannot get it to go down.  She reports bowel movements okay.  Patient still declines feeding tube.  Treated with Diflucan 100 mg daily for 20 days  EGD June 2023: - Candidiasis esophagitis with no bleeding. Cells for cytology obtained.  KOH positive for yeast. - Gastritis. Biopsied.  Unremarkable. - Normal duodenal bulb, first portion of the duodenum and second portion of the duodenum.  Colonoscopy June 2023: - Non-bleeding internal hemorrhoids. - Diverticulosis in the sigmoid colon. - One 8 mm polyp at the appendiceal orifice, removed with a cold snare. Resected and retrieved. - Melanosis in the colon. - The examined portion of the ileum was normal - Sessile serrated polyp, next colonoscopy in 5 years  Labs from March 2023 at [facility]: Iron 12, TIBC 148, iron saturations 8%, ferritin 1056.  Initial hemoglobin of 7.7, platelets 717,000 at time of admission.  Hemoglobin dropped down to 6.9.  At time of discharge hemoglobin 8.6, platelets 620,000.  Appears she received blood transfusion.  Medications   Current Outpatient Medications  Medication Sig Dispense Refill   acetaminophen (TYLENOL) 325 MG tablet Take 650 mg by mouth every 6 (six) hours as needed.     aspirin 81 MG chewable tablet Chew by mouth daily.     atenolol (TENORMIN) 25 MG tablet Take 12.5 mg by mouth daily.     Balsam [place]-Castor Oil (VENELEX) OINT Apply topically. Special Instructions: Apply to sacrum, bilateral buttocks and coccyx qshift for prevention. Every Shift     Ensure Plus (ENSURE PLUS) LIQD Take 237 mLs by mouth 2 (two) times daily between meals.      lacosamide (VIMPAT) 50 MG TABS tablet Take 1 tablet (50 mg total) by mouth 2 (two) times daily. 60 tablet 0   levETIRAcetam (KEPPRA) 750 MG tablet Take 750 mg by mouth 2 (two) times daily.     levothyroxine (SYNTHROID) 75 MCG tablet Take 75 mcg by mouth daily before breakfast.     ondansetron (ZOFRAN-ODT) 4 MG disintegrating tablet Take 4 mg by mouth every 8 (eight) hours as needed for nausea or vomiting.     pantoprazole (PROTONIX) 40 MG tablet Take 40 mg by mouth 2 (two) times daily.     predniSONE (DELTASONE) 10 MG tablet Take 10 mg by mouth daily with breakfast.     sennosides-docusate sodium (SENOKOT-S) 8.6-50 MG tablet Take 1 tablet by mouth 2 (two) times daily.     sertraline (ZOLOFT) 50 MG tablet Take 50 mg by mouth daily.     ticagrelor (BRILINTA) 90 MG TABS tablet Take 90 mg by mouth 2 (two) times daily.     UNABLE TO FIND Diet: Regular diet with thin liquids     No current facility-administered medications for this visit.    Allergies   Allergies as of 03/29/2022   (No Known Allergies)      Review of Systems   General: Negative for fever, chills, fatigue. Weak, in wheelchair, participating in therapy. See hpi ENT: Negative for hoarseness, difficulty swallowing , nasal congestion. CV: Negative for chest pain, angina, palpitations, dyspnea on exertion, peripheral edema.  Respiratory: Negative for dyspnea at rest, dyspnea on exertion, cough, sputum, wheezing.  GI: See history of present illness. GU:  Negative for dysuria, hematuria, urinary incontinence, urinary frequency, nocturnal urination.  Endo: see hpi     Physical Exam   BP 106/73   Pulse (!) 111   Temp (!) 97.3 F (36.3 C)   Wt 122 lb 6.4 oz (55.5 kg)   BMI 19.76 kg/m    General: Appears older than stated age.  Thin.  In wheelchair.  Hunched over but does sit up when questions asked.  Appropriate responses.  2 sisters present.    Eyes: No icterus. Mouth: Oropharyngeal mucosa moist and pink , no lesions  erythema or exudate. Lungs: Clear to auscultation bilaterally.  Heart: Regular rate and rhythm, no murmurs rubs or gallops.  Abdomen: Bowel sounds are normal, nontender, nondistended, no hepatosplenomegaly or masses,  no abdominal bruits or hernia , no rebound or guarding.  Exam limited, in wheelchair. Rectal: not performed  Extremities: No lower extremity edema. No clubbing or deformities. Neuro: Alert and oriented x to person and place Skin: Warm and dry, no jaundice.   Psych: Alert and cooperative, normal mood and affect.  Labs   Lab Results  Component Value Date   CREATININE 0.84 03/26/2022   BUN 9 03/26/2022   NA 136 03/26/2022   K 4.0 03/28/2022   CL 107 03/26/2022   CO2 25 03/26/2022   Lab Results  Component Value Date   ALT 7 03/26/2022   AST 16 03/26/2022   ALKPHOS 62 03/26/2022   BILITOT 0.7 03/26/2022   Lab Results  Component Value Date   WBC 4.2 03/26/2022   HGB 8.2 (L) 03/26/2022   HCT 26.3 (L) 03/26/2022   MCV 86.8 03/26/2022   PLT 508 (H) 03/26/2022   Lab Results  Component Value Date   IRON 23 (L) 01/10/2022   TIBC 135 (L) 01/10/2022   FERRITIN 972 (H) 11/13/2021   No results found for: "VITAMINB12" No results found for: "FOLATE" Lab Results  Component Value Date   TSH 4.826 (H) 03/26/2022  No results found for: "VITAMINB12" No results found for: "FOLATE"   Imaging Studies   US AORTA  Result Date: 20-Mar-2022 CLINICAL DATA:  Abdominal aortic aneurysm. EXAM: ULTRASOUND OF ABDOMINAL AORTA TECHNIQUE: Ultrasound examination of the abdominal aorta and proximal common iliac arteries was performed to evaluate for aneurysm. Additional color and Doppler images of the distal aorta were obtained to document patency. COMPARISON:  None Available. FINDINGS: Abdominal aortic measurements as follows: Proximal:  2.7 x 2.1 cm Mid:  1.9 x 1.8 cm Distal:  1.5 x 1.7 cm Patent: Yes, peak systolic velocity is 72.5 cm/s Right common iliac artery: 1.1 x 1.1 cm Left  common iliac artery: 1.1 x 1.1 cm IMPRESSION: 1. No abdominal aortic aneurysm. Electronically Signed   By: Titus Dubin M.D.   On: 20-Mar-2022 12:12    Assessment   Weight loss: likely multifactorial but at least in part due to anorexia and swallowing difficulties. She has lost nearly 100 pounds in the past 3.5 years. She describes oropharyngeal dysphagia. Recent EGD with candida esophagitis, treated with diflucan for 20 days, no change in symptoms. She is reportedly seeing speech therapy at [facility] but it is not clear as to extent of swallowing evaluation she has had. Cannot exclude underlying malignancy but appears patients weight loss due to nutritional deficits. Patient denies any postprandial abdominal pain making mesenteric ischemia less likely.   Iron deficiency anemia: recent Hgb stable. Recent EGD/colonoscopy as outlined. If further drop in Hgb or requires transfusions, would consider completing gi work up of anemia with small bowel capsule study. Previous serum iron low but ferritin significantly elevated in setting of lupus, currently on low dose prednisone. May benefit from rheumatology follow up.     PLAN   Continue pantoprazole '40mg'$  twice daily. Avoid nsaids. Request records regarding swallowing evaluation. If none done, then we will order.  Currently patient declines feeding tube.    Laureen Ochs. Bobby Rumpf, [place], [facility]

## 2022-04-01 ENCOUNTER — Other Ambulatory Visit (HOSPITAL_COMMUNITY)
Admission: RE | Admit: 2022-04-01 | Discharge: 2022-04-01 | Disposition: A | Discharge status: Home / Self Care | Payer: Medicare Other | Source: Skilled Nursing Facility | Attending: Adult Health | Admitting: Adult Health

## 2022-04-01 DIAGNOSIS — M6281 Muscle weakness (generalized): Secondary | ICD-10-CM | POA: Diagnosis not present

## 2022-04-01 DIAGNOSIS — R29818 Other symptoms and signs involving the nervous system: Secondary | ICD-10-CM | POA: Diagnosis not present

## 2022-04-01 DIAGNOSIS — R1312 Dysphagia, oropharyngeal phase: Secondary | ICD-10-CM | POA: Diagnosis not present

## 2022-04-01 DIAGNOSIS — I69398 Other sequelae of cerebral infarction: Secondary | ICD-10-CM | POA: Diagnosis not present

## 2022-04-01 DIAGNOSIS — I69321 Dysphasia following cerebral infarction: Secondary | ICD-10-CM | POA: Diagnosis not present

## 2022-04-01 DIAGNOSIS — R262 Difficulty in walking, not elsewhere classified: Secondary | ICD-10-CM | POA: Diagnosis not present

## 2022-04-01 LAB — RPR: RPR Ser Ql: NONREACTIVE

## 2022-04-01 LAB — HIV ANTIBODY (ROUTINE TESTING W REFLEX): HIV Screen 4th Generation wRfx: NONREACTIVE

## 2022-04-02 DIAGNOSIS — R1312 Dysphagia, oropharyngeal phase: Secondary | ICD-10-CM | POA: Diagnosis not present

## 2022-04-02 DIAGNOSIS — M6281 Muscle weakness (generalized): Secondary | ICD-10-CM | POA: Diagnosis not present

## 2022-04-02 DIAGNOSIS — R262 Difficulty in walking, not elsewhere classified: Secondary | ICD-10-CM | POA: Diagnosis not present

## 2022-04-02 DIAGNOSIS — I69321 Dysphasia following cerebral infarction: Secondary | ICD-10-CM | POA: Diagnosis not present

## 2022-04-02 DIAGNOSIS — I69398 Other sequelae of cerebral infarction: Secondary | ICD-10-CM | POA: Diagnosis not present

## 2022-04-02 NOTE — Telephone Encounter (Signed)
Requested records from [facility]

## 2022-04-02 NOTE — Telephone Encounter (Signed)
Routing to Manuela Schwartz to obtain requested records.

## 2022-04-03 DIAGNOSIS — R1312 Dysphagia, oropharyngeal phase: Secondary | ICD-10-CM | POA: Diagnosis not present

## 2022-04-03 DIAGNOSIS — M6281 Muscle weakness (generalized): Secondary | ICD-10-CM | POA: Diagnosis not present

## 2022-04-03 DIAGNOSIS — R262 Difficulty in walking, not elsewhere classified: Secondary | ICD-10-CM | POA: Diagnosis not present

## 2022-04-03 DIAGNOSIS — I69398 Other sequelae of cerebral infarction: Secondary | ICD-10-CM | POA: Diagnosis not present

## 2022-04-03 DIAGNOSIS — I69321 Dysphasia following cerebral infarction: Secondary | ICD-10-CM | POA: Diagnosis not present

## 2022-04-04 ENCOUNTER — Other Ambulatory Visit: Payer: Self-pay | Admitting: Adult Health

## 2022-04-04 DIAGNOSIS — I69321 Dysphasia following cerebral infarction: Secondary | ICD-10-CM | POA: Diagnosis not present

## 2022-04-04 DIAGNOSIS — R262 Difficulty in walking, not elsewhere classified: Secondary | ICD-10-CM | POA: Diagnosis not present

## 2022-04-04 DIAGNOSIS — I69398 Other sequelae of cerebral infarction: Secondary | ICD-10-CM | POA: Diagnosis not present

## 2022-04-04 DIAGNOSIS — R1312 Dysphagia, oropharyngeal phase: Secondary | ICD-10-CM | POA: Diagnosis not present

## 2022-04-04 DIAGNOSIS — M6281 Muscle weakness (generalized): Secondary | ICD-10-CM | POA: Diagnosis not present

## 2022-04-04 MED ORDER — LACOSAMIDE 50 MG PO TABS: 50.0000 mg | ORAL_TABLET | Freq: Two times a day (BID) | ORAL | 0 refills | Status: DC

## 2022-04-09 ENCOUNTER — Emergency Department (HOSPITAL_COMMUNITY): Payer: Medicare Other

## 2022-04-09 ENCOUNTER — Emergency Department (HOSPITAL_COMMUNITY)
Admission: EM | Admit: 2022-04-09 | Discharge: 2022-04-09 | Disposition: A | Discharge status: Home / Self Care | Payer: Medicare Other | Attending: Emergency Medicine | Admitting: Emergency Medicine

## 2022-04-09 DIAGNOSIS — R262 Difficulty in walking, not elsewhere classified: Secondary | ICD-10-CM | POA: Diagnosis not present

## 2022-04-09 DIAGNOSIS — Z79899 Other long term (current) drug therapy: Secondary | ICD-10-CM | POA: Diagnosis not present

## 2022-04-09 DIAGNOSIS — I959 Hypotension, unspecified: Secondary | ICD-10-CM | POA: Diagnosis not present

## 2022-04-09 DIAGNOSIS — W19XXXA Unspecified fall, initial encounter: Secondary | ICD-10-CM

## 2022-04-09 DIAGNOSIS — Z043 Encounter for examination and observation following other accident: Secondary | ICD-10-CM | POA: Diagnosis not present

## 2022-04-09 DIAGNOSIS — M160 Bilateral primary osteoarthritis of hip: Secondary | ICD-10-CM | POA: Diagnosis not present

## 2022-04-09 DIAGNOSIS — I10 Essential (primary) hypertension: Secondary | ICD-10-CM | POA: Insufficient documentation

## 2022-04-09 DIAGNOSIS — S5011XA Contusion of right forearm, initial encounter: Secondary | ICD-10-CM

## 2022-04-09 DIAGNOSIS — F0390 Unspecified dementia without behavioral disturbance: Secondary | ICD-10-CM | POA: Diagnosis not present

## 2022-04-09 DIAGNOSIS — R1312 Dysphagia, oropharyngeal phase: Secondary | ICD-10-CM | POA: Diagnosis not present

## 2022-04-09 DIAGNOSIS — R55 Syncope and collapse: Secondary | ICD-10-CM | POA: Diagnosis not present

## 2022-04-09 DIAGNOSIS — S0990XA Unspecified injury of head, initial encounter: Secondary | ICD-10-CM

## 2022-04-09 DIAGNOSIS — M47812 Spondylosis without myelopathy or radiculopathy, cervical region: Secondary | ICD-10-CM | POA: Diagnosis not present

## 2022-04-09 DIAGNOSIS — Z7982 Long term (current) use of aspirin: Secondary | ICD-10-CM | POA: Insufficient documentation

## 2022-04-09 DIAGNOSIS — R569 Unspecified convulsions: Secondary | ICD-10-CM | POA: Diagnosis not present

## 2022-04-09 DIAGNOSIS — E860 Dehydration: Secondary | ICD-10-CM | POA: Insufficient documentation

## 2022-04-09 DIAGNOSIS — I69321 Dysphasia following cerebral infarction: Secondary | ICD-10-CM | POA: Diagnosis not present

## 2022-04-09 DIAGNOSIS — W1839XA Other fall on same level, initial encounter: Secondary | ICD-10-CM | POA: Diagnosis not present

## 2022-04-09 DIAGNOSIS — I672 Cerebral atherosclerosis: Secondary | ICD-10-CM | POA: Diagnosis not present

## 2022-04-09 DIAGNOSIS — M19011 Primary osteoarthritis, right shoulder: Secondary | ICD-10-CM | POA: Diagnosis not present

## 2022-04-09 DIAGNOSIS — S0003XA Contusion of scalp, initial encounter: Secondary | ICD-10-CM | POA: Diagnosis not present

## 2022-04-09 DIAGNOSIS — M6281 Muscle weakness (generalized): Secondary | ICD-10-CM | POA: Diagnosis not present

## 2022-04-09 DIAGNOSIS — I69398 Other sequelae of cerebral infarction: Secondary | ICD-10-CM | POA: Diagnosis not present

## 2022-04-09 LAB — BASIC METABOLIC PANEL
Anion gap: 8 (ref 5–15)
BUN: 12 mg/dL (ref 8–23)
CO2: 22 mmol/L (ref 22–32)
Calcium: 8.2 mg/dL — ABNORMAL LOW (ref 8.9–10.3)
Chloride: 104 mmol/L (ref 98–111)
Creatinine, Ser: 1.17 mg/dL — ABNORMAL HIGH (ref 0.44–1.00)
GFR, Estimated: 51 mL/min — ABNORMAL LOW (ref >60)
Glucose, Bld: 101 mg/dL — ABNORMAL HIGH (ref 70–99)
Potassium: 3.5 mmol/L (ref 3.5–5.1)
Sodium: 134 mmol/L — ABNORMAL LOW (ref 135–145)

## 2022-04-09 LAB — CBC WITH DIFFERENTIAL/PLATELET
Abs Immature Granulocytes: 0.05 10*3/uL (ref 0.00–0.07)
Basophils Absolute: 0 10*3/uL (ref 0.0–0.1)
Basophils Relative: 0 %
Eosinophils Absolute: 0 10*3/uL (ref 0.0–0.5)
Eosinophils Relative: 0 %
HCT: 30.1 % — ABNORMAL LOW (ref 36.0–46.0)
Hemoglobin: 9.2 g/dL — ABNORMAL LOW (ref 12.0–15.0)
Immature Granulocytes: 1 %
Lymphocytes Relative: 8 %
Lymphs Abs: 0.5 10*3/uL — ABNORMAL LOW (ref 0.7–4.0)
MCH: 28 pg (ref 26.0–34.0)
MCHC: 30.6 g/dL (ref 30.0–36.0)
MCV: 91.8 fL (ref 80.0–100.0)
Monocytes Absolute: 0.3 10*3/uL (ref 0.1–1.0)
Monocytes Relative: 5 %
Neutro Abs: 5.4 10*3/uL (ref 1.7–7.7)
Neutrophils Relative %: 86 %
Platelets: 490 10*3/uL — ABNORMAL HIGH (ref 150–400)
RBC: 3.28 MIL/uL — ABNORMAL LOW (ref 3.87–5.11)
RDW: 20.1 % — ABNORMAL HIGH (ref 11.5–15.5)
WBC: 6.3 10*3/uL (ref 4.0–10.5)
nRBC: 0 % (ref 0.0–0.2)

## 2022-04-09 LAB — CK: Total CK: 72 U/L (ref 38–234)

## 2022-04-09 MED ORDER — SODIUM CHLORIDE 0.9 % IV BOLUS
500.0000 mL | Freq: Once | INTRAVENOUS | Status: AC
  Administered 2022-04-09: 500 mL via INTRAVENOUS

## 2022-04-09 NOTE — ED Triage Notes (Signed)
BIB EMS from SNF after being found in the floor. Pt has h/o seizures, and was asleep in floor. Pt possibly has right arm pain, but moves it freely without grimace of pain.

## 2022-04-09 NOTE — ED Notes (Signed)
Pt transported to [facility] by wheelchair with Pearla Dubonnet NT.

## 2022-04-09 NOTE — Discharge Instructions (Signed)
Work-up for the fall and possible seizure without evidence of anything significant.  CT head and neck was negative.  X-ray of right forearm right humerus negative for any bony abnormalities.  X-ray of both hips and pelvis negative.  Patient's had some renal insufficiency probably consistent with some dehydration received 500 cc normal saline bolus here.  Patient did not have an elevated CK so no concerns for rhabdomyolysis.  Patient has been alert and baseline here.  Cannot completely rule out a seizure.  But no evidence based on labs.  Patient does have a contusion to the right side of her scalp.

## 2022-04-09 NOTE — ED Notes (Signed)
[facility] contacted. Will send someone over as soon as they can.

## 2022-04-09 NOTE — ED Provider Notes (Addendum)
[facility] EMERGENCY DEPARTMENT Provider Note   CSN: [id number] Arrival date & time: 04/09/22  1422     History  Chief Complaint  Patient presents with   Rebekah [name] is a 66 y.o. female.  Patient brought in by EMS from nursing facility.  Patient is a patient at the [facility].  Patient is a DNR.  Patient was found on the floor patient was asleep at the time patient has a history of seizures but no seizure activity was noted.  Patient possibly has pain to the right arm a little bit of an abrasion there but moves it freely without any grimacing of pain.  Patient is alert but does not recall what happened.  No tongue biting no incontinence.  Past medical history significant for hypertension prior history of stroke and systemic lupus.  And a history of seizures.  His medication include Tenormin Keppra Synthroid Zoloft Brilinta.  Patient does seem to have some degree of dementia.       Home Medications Prior to Admission medications   Medication Sig Start Date End Date Taking? Authorizing Provider  acetaminophen (TYLENOL) 325 MG tablet Take 650 mg by mouth every 6 (six) hours as needed.   Yes [provider]  aspirin 81 MG chewable tablet Chew 81 mg by mouth daily.   Yes [provider]  atenolol (TENORMIN) 25 MG tablet Take 12.5 mg by mouth daily.   Yes [provider]  lacosamide (VIMPAT) 50 MG TABS tablet Take 1 tablet (50 mg total) by mouth 2 (two) times daily. 04/04/22  Yes Gerlene Fee, NP  levETIRAcetam (KEPPRA) 100 MG/ML solution Take 750 mg by mouth 2 (two) times daily. 03/26/22  Yes [provider]  levothyroxine (SYNTHROID) 75 MCG tablet Take 75 mcg by mouth daily before breakfast.   Yes [provider]  ondansetron (ZOFRAN-ODT) 4 MG disintegrating tablet Take 4 mg by mouth every 8 (eight) hours as needed for nausea or vomiting.   Yes [provider]  pantoprazole (PROTONIX) 40 MG tablet Take 40 mg by mouth  2 (two) times daily.   Yes [provider]  predniSONE (DELTASONE) 10 MG tablet Take 10 mg by mouth daily with breakfast.   Yes [provider]  sennosides-docusate sodium (SENOKOT-S) 8.6-50 MG tablet Take 1 tablet by mouth 2 (two) times daily.   Yes [provider]  sertraline (ZOLOFT) 50 MG tablet Take 50 mg by mouth daily.   Yes [provider]  ticagrelor (BRILINTA) 90 MG TABS tablet Take 90 mg by mouth 2 (two) times daily.   Yes [provider]  atenolol (TENORMIN) 100 MG tablet Take 100 mg by mouth 2 (two) times daily.    [provider]  UNABLE TO FIND Diet: Regular diet with thin liquids    [provider]      Allergies    Patient has no known allergies.    Review of Systems   Review of Systems  Unable to perform ROS: Dementia  Psychiatric/Behavioral:  Positive for confusion.     Physical Exam Updated Vital Signs BP 129/72   Pulse 61   Temp 97.9 F (36.6 C) (Oral)   Resp 17   SpO2 100%  Physical Exam Vitals and nursing note reviewed.  Constitutional:      General: She is not in acute distress.    Appearance: She is well-developed.     Comments: Patient is very thin  HENT:  Head: Normocephalic.     Comments: Hematoma to the left side of the head.    Mouth/Throat:     Mouth: Mucous membranes are dry.  Eyes:     Extraocular Movements: Extraocular movements intact.     Conjunctiva/sclera: Conjunctivae normal.     Pupils: Pupils are equal, round, and reactive to light.  Cardiovascular:     Rate and Rhythm: Normal rate and regular rhythm.     Heart sounds: No murmur heard. Pulmonary:     Effort: Pulmonary effort is normal. No respiratory distress.     Breath sounds: Normal breath sounds.  Abdominal:     Palpations: Abdomen is soft.     Tenderness: There is no abdominal tenderness. There is no guarding.  Musculoskeletal:        General: No swelling.     Cervical back: Neck supple.     Right lower  leg: No edema.     Left lower leg: No edema.     Comments: We will bit abrasion to the right forearm right humerus area  Skin:    General: Skin is warm and dry.     Capillary Refill: Capillary refill takes less than 2 seconds.  Neurological:     General: No focal deficit present.     Mental Status: She is alert. Mental status is at baseline.  Psychiatric:        Mood and Affect: Mood normal.     ED Results / Procedures / Treatments   Labs (all labs ordered are listed, but only abnormal results are displayed) Labs Reviewed  BASIC METABOLIC PANEL - Abnormal; Notable for the following components:      Result Value   Sodium 134 (*)    Glucose, Bld 101 (*)    Creatinine, Ser 1.17 (*)    Calcium 8.2 (*)    GFR, Estimated 51 (*)    All other components within normal limits  CBC WITH DIFFERENTIAL/PLATELET - Abnormal; Notable for the following components:   RBC 3.28 (*)    Hemoglobin 9.2 (*)    HCT 30.1 (*)    RDW 20.1 (*)    Platelets 490 (*)    Lymphs Abs 0.5 (*)    All other components within normal limits  CK  CBC WITH DIFFERENTIAL/PLATELET    EKG EKG Interpretation  Date/Time:  Tuesday April 09 2022 14:35:07 EDT Ventricular Rate:  60 PR Interval:  122 QRS Duration: 102 QT Interval:  591 QTC Calculation: 591 R Axis:   10 Text Interpretation: Sinus rhythm Low voltage, precordial leads Nonspecific T abnrm, anterolateral leads Prolonged QT interval Confirmed by Fredia Sorrow [phone]) on 04/09/2022 4:47:23 PM  Radiology CT Head Wo Contrast  Result Date: 04/09/2022 CLINICAL DATA:  Found down, history of seizures EXAM: CT HEAD WITHOUT CONTRAST TECHNIQUE: Contiguous axial images were obtained from the base of the skull through the vertex without intravenous contrast. RADIATION DOSE REDUCTION: This exam was performed according to the departmental dose-optimization program which includes automated exposure control, adjustment of the mA and/or kV according to patient size and/or  use of iterative reconstruction technique. COMPARISON:  11/30/2021 FINDINGS: Brain: No evidence of acute infarction, hemorrhage, mass, mass effect, or midline shift. No hydrocephalus or extra-axial fluid collection. Periventricular white matter changes, likely the sequela of chronic small vessel ischemic disease. Vascular: No hyperdense vessel. Atherosclerotic calcifications in the intracranial carotid and vertebral arteries. Skull: Normal. Negative for fracture or focal lesion. Left parietal vertex scalp hematoma. Sinuses/Orbits: No acute finding. Other: The  mastoid air cells are well aerated. IMPRESSION: 1.  No acute intracranial process. 2.  Left parietal vertex scalp hematoma. Electronically Signed   By: Merilyn Baba M.D.   On: 04/09/2022 16:12   CT Cervical Spine Wo Contrast  Result Date: 04/09/2022 CLINICAL DATA:  Found down. EXAM: CT CERVICAL SPINE WITHOUT CONTRAST TECHNIQUE: Multidetector CT imaging of the cervical spine was performed without intravenous contrast. Multiplanar CT image reconstructions were also generated. RADIATION DOSE REDUCTION: This exam was performed according to the departmental dose-optimization program which includes automated exposure control, adjustment of the mA and/or kV according to patient size and/or use of iterative reconstruction technique. COMPARISON:  None Available. FINDINGS: Alignment: Preservation of the normal cervical lordosis. No evidence of traumatic listhesis. Skull base and vertebrae: No acute fracture. No primary bone lesion or focal pathologic process. Soft tissues and spinal canal: No prevertebral fluid or swelling. No visible canal hematoma. Disc levels: Multilevel degenerative changes spine with disc space narrowing, osteophytosis uncovertebral/facet hypertrophy most notable at C6-7. Upper chest: No acute abnormality. Other: None. IMPRESSION: No evidence of acute cervical spine fracture or traumatic listhesis. Electronically Signed   By: Dahlia Bailiff  M.D.   On: 04/09/2022 16:12   DG Hips Bilat W or Wo Pelvis 3-4 Views  Result Date: 04/09/2022 CLINICAL DATA:  Fall EXAM: DG HIP (WITH OR WITHOUT PELVIS) 3-4V BILAT COMPARISON:  None Available. FINDINGS: There is no evidence of acute fracture. Alignment is normal. There is moderate bilateral hip osteoarthritis. IMPRESSION: No evidence of acute hip fracture. Moderate bilateral hip osteoarthritis. Electronically Signed   By: Maurine Simmering M.D.   On: 04/09/2022 15:57   DG Forearm Right  Result Date: 04/09/2022 CLINICAL DATA:  Fall with limited range of motion of right upper extremity. EXAM: RIGHT FOREARM - 2 VIEW; RIGHT HUMERUS - 2+ VIEW COMPARISON:  None Available. FINDINGS: Humerus: There is no acute fracture or dislocation. Shoulder and elbow alignment appears maintained. The soft tissues are unremarkable. Forearm: There is no acute fracture or dislocation. Bony alignment is normal. There is mild degenerative change of the radiocarpal joint. The soft tissues are unremarkable. IMPRESSION: No acute fracture or dislocation of the humerus or forearm. Electronically Signed   By: Valetta Mole M.D.   On: 04/09/2022 15:56   DG Humerus Right  Result Date: 04/09/2022 CLINICAL DATA:  Fall with limited range of motion of right upper extremity. EXAM: RIGHT FOREARM - 2 VIEW; RIGHT HUMERUS - 2+ VIEW COMPARISON:  None Available. FINDINGS: Humerus: There is no acute fracture or dislocation. Shoulder and elbow alignment appears maintained. The soft tissues are unremarkable. Forearm: There is no acute fracture or dislocation. Bony alignment is normal. There is mild degenerative change of the radiocarpal joint. The soft tissues are unremarkable. IMPRESSION: No acute fracture or dislocation of the humerus or forearm. Electronically Signed   By: Valetta Mole M.D.   On: 04/09/2022 15:56    Procedures Procedures    Medications Ordered in ED Medications  sodium chloride 0.9 % bolus 500 mL (500 mLs Intravenous New Bag/Given  04/09/22 1818)    ED Course/ Medical Decision Making/ A&P                           Medical Decision Making Amount and/or Complexity of Data Reviewed Labs: ordered. Radiology: ordered.  Patient appears dehydrated.  Patient has a history of seizures but no evidence of seizure.  White count not elevated at 6.3 hemoglobin 9.2 baseline for  patient platelets 490.  Patient metabolic panel glucose 144 sodium 134 CO2 22 creatinine at 1.17 her GFR 51 which is a little lower than baseline for patient.  CK is not elevated so no concerns about rhabdo.  CT head no acute intracranial process.  Does show a left parietal scalp hematoma.  CT cervical spine no evidence of any acute spinal injury.  X-ray of the right forearm and right humerus without any bony abnormalities.  In addition x-ray of both hips and pelvis without any acute bony abnormalities.  Patient for some mild renal insufficiency probably dehydration we will get a 500 cc bolus of normal saline and then patient is stable for discharge back to the nursing facility.  It is possible patient could have had a seizure but based on the labs it seems unlikely.  No evidence of any significant traumatic injury.   Final Clinical Impression(s) / ED Diagnoses Final diagnoses:  Fall, initial encounter  Injury of head, initial encounter  Contusion of right forearm, initial encounter  Dehydration    Rx / DC Orders ED Discharge Orders     None         Fredia Sorrow, MD 04/09/22 Nicholaus Bloom, MD 04/09/22 [id number]

## 2022-04-09 NOTE — ED Notes (Signed)
Pt to CT

## 2022-04-10 ENCOUNTER — Non-Acute Institutional Stay (SKILLED_NURSING_FACILITY): Payer: Medicare Other | Admitting: Adult Health

## 2022-04-10 ENCOUNTER — Encounter: Payer: Self-pay | Admitting: Adult Health

## 2022-04-10 ENCOUNTER — Other Ambulatory Visit: Payer: Self-pay | Admitting: Adult Health

## 2022-04-10 DIAGNOSIS — I69321 Dysphasia following cerebral infarction: Secondary | ICD-10-CM | POA: Diagnosis not present

## 2022-04-10 DIAGNOSIS — R569 Unspecified convulsions: Secondary | ICD-10-CM

## 2022-04-10 DIAGNOSIS — I69398 Other sequelae of cerebral infarction: Secondary | ICD-10-CM

## 2022-04-10 DIAGNOSIS — R262 Difficulty in walking, not elsewhere classified: Secondary | ICD-10-CM | POA: Diagnosis not present

## 2022-04-10 DIAGNOSIS — R1312 Dysphagia, oropharyngeal phase: Secondary | ICD-10-CM | POA: Diagnosis not present

## 2022-04-10 DIAGNOSIS — M6281 Muscle weakness (generalized): Secondary | ICD-10-CM | POA: Diagnosis not present

## 2022-04-10 MED ORDER — LACOSAMIDE 100 MG PO TABS: 100.0000 mg | ORAL_TABLET | Freq: Two times a day (BID) | ORAL | 0 refills | Status: DC

## 2022-04-10 NOTE — Progress Notes (Unsigned)
Location:  [place] Room Number: [place] of Service:  SNF (31)   CODE STATUS: DNR  No Known Allergies  Chief Complaint  Patient presents with   Hospitalization Follow-up    ER Follow up     HPI:    Past Medical History:  Diagnosis Date   Arthritis    Rheumatoid   Dysphagia    post CVA   Hypertension    Lupus (systemic lupus erythematosus) ([place])    Seizure ([place])    Stroke [facility])     Past Surgical History:  Procedure Laterality Date   BIOPSY  02/21/2022   Procedure: BIOPSY;  Surgeon: [name], DO;  [facility]: [facility];  Service: Endoscopy;;   CESAREAN SECTION     COLONOSCOPY WITH PROPOFOL N/A 02/21/2022   Procedure: COLONOSCOPY WITH PROPOFOL;  Surgeon: [name], DO;  Location: [facility];  Service: Endoscopy;  Laterality: N/A;  2:00pm   ESOPHAGEAL BRUSHING  02/21/2022   Procedure: ESOPHAGEAL BRUSHING;  Surgeon: [name], DO;  Location: [facility];  Service: Endoscopy;;   ESOPHAGOGASTRODUODENOSCOPY (EGD) WITH PROPOFOL N/A 02/21/2022   Procedure: ESOPHAGOGASTRODUODENOSCOPY (EGD) WITH PROPOFOL;  Surgeon: [name], DO;  Location: [facility];  Service: Endoscopy;  Laterality: N/A;   POLYPECTOMY  02/21/2022   Procedure: POLYPECTOMY;  Surgeon: [name], DO;  Location: [facility];  Service: Endoscopy;;    Social History   Socioeconomic History   Marital status: Single    Spouse name: Not on file   Number of children: Not on file   Years of education: Not on file   Highest education level: Not on file  Occupational History   Not on file  Tobacco Use   Smoking status: Never   Smokeless tobacco: Never  Vaping Use   Vaping Use: Never used  Substance and Sexual Activity   Alcohol use: No    Alcohol/week: 0.0 standard drinks of alcohol   Drug use: No   Sexual activity: Not on file  Other Topics Concern   Not on file  Social History Narrative   Lives with sister.     Social  Determinants of Health   Financial Resource Strain: Not on file  Food Insecurity: Not on file  Transportation Needs: Not on file  Physical Activity: Not on file  Stress: Not on file  Social Connections: Not on file  Intimate Partner Violence: Not on file   Family History  Problem Relation Age of Onset   Diabetes Mother    Hypertension Mother    Cancer Mother    Stroke Mother    Colon cancer Father    Hypertension Sister    Hypertension Sister    Hypertension Sister    Hypertension Sister    Hypertension Sister    Hypertension Sister    Hypertension Brother    Hypertension Brother       VITAL SIGNS BP 121/66   Pulse 60   Temp 97.8 F (36.6 C) (Skin)   Resp 20   Ht '5\' 6"'$  (1.676 m)   Wt 122 lb 6.4 oz (55.5 kg)   SpO2 95%   BMI 19.76 kg/m   Outpatient Encounter Medications as of 04/10/2022  Medication Sig   acetaminophen (TYLENOL) 325 MG tablet Take 650 mg by mouth every 6 (six) hours as needed.   aspirin 81 MG chewable tablet Chew 81 mg by mouth daily.   atenolol (TENORMIN) 25 MG tablet Take 12.5  mg by mouth daily.   Balsam [place]-Castor Oil (VENELEX) OINT Apply to sacrum, bilateral buttocks and coccyx three times daily.   Ensure Plus (ENSURE PLUS) LIQD Take 237 mLs by mouth 3 (three) times daily between meals.   Lacosamide (VIMPAT) 100 MG TABS Take 1 tablet (100 mg total) by mouth in the morning and at bedtime.   levETIRAcetam (KEPPRA) 100 MG/ML solution Take 750 mg by mouth 2 (two) times daily.   levothyroxine (SYNTHROID) 75 MCG tablet Take 75 mcg by mouth daily before breakfast.   LORazepam (ATIVAN) 2 MG/ML concentrated solution '1mg'$  as needed for continued seizure activity up to 3 doses max. If still having seizure activity after 3 doses, send to ER.   ondansetron (ZOFRAN-ODT) 4 MG disintegrating tablet Take 4 mg by mouth every 8 (eight) hours as needed for nausea or vomiting.   pantoprazole (PROTONIX) 40 MG tablet Take 40 mg by mouth 2 (two) times daily.    predniSONE (DELTASONE) 10 MG tablet Take 10 mg by mouth daily with breakfast.   sennosides-docusate sodium (SENOKOT-S) 8.6-50 MG tablet Take 1 tablet by mouth 2 (two) times daily.   sertraline (ZOLOFT) 50 MG tablet Take 50 mg by mouth daily.   ticagrelor (BRILINTA) 90 MG TABS tablet Take 90 mg by mouth 2 (two) times daily.   UNABLE TO FIND Diet: Regular diet with thin liquids   atenolol (TENORMIN) 100 MG tablet Take 100 mg by mouth 2 (two) times daily. (Patient not taking: Reported on 04/10/2022)   No facility-administered encounter medications on file as of 04/10/2022.     SIGNIFICANT DIAGNOSTIC EXAMS       ASSESSMENT/ PLAN:     Rebekah Edwards NP [facility]  Contact [phone] Monday through Friday 8am- 5pm  After hours call [phone]

## 2022-04-12 ENCOUNTER — Encounter: Payer: Self-pay | Admitting: Adult Health

## 2022-04-12 ENCOUNTER — Non-Acute Institutional Stay (SKILLED_NURSING_FACILITY): Payer: Medicare Other | Admitting: Adult Health

## 2022-04-12 DIAGNOSIS — I69398 Other sequelae of cerebral infarction: Secondary | ICD-10-CM | POA: Diagnosis not present

## 2022-04-12 DIAGNOSIS — M329 Systemic lupus erythematosus, unspecified: Secondary | ICD-10-CM | POA: Diagnosis not present

## 2022-04-12 DIAGNOSIS — R569 Unspecified convulsions: Secondary | ICD-10-CM | POA: Diagnosis not present

## 2022-04-12 DIAGNOSIS — M0579 Rheumatoid arthritis with rheumatoid factor of multiple sites without organ or systems involvement: Secondary | ICD-10-CM

## 2022-04-12 DIAGNOSIS — R262 Difficulty in walking, not elsewhere classified: Secondary | ICD-10-CM | POA: Diagnosis not present

## 2022-04-12 DIAGNOSIS — N184 Chronic kidney disease, stage 4 (severe): Secondary | ICD-10-CM

## 2022-04-12 DIAGNOSIS — I69321 Dysphasia following cerebral infarction: Secondary | ICD-10-CM | POA: Diagnosis not present

## 2022-04-12 DIAGNOSIS — M6281 Muscle weakness (generalized): Secondary | ICD-10-CM | POA: Diagnosis not present

## 2022-04-12 DIAGNOSIS — R1312 Dysphagia, oropharyngeal phase: Secondary | ICD-10-CM | POA: Diagnosis not present

## 2022-04-12 NOTE — Progress Notes (Signed)
Location:  [place] Room Number: [place] of Service:  SNF (31) Provider:  Ok Edwards, NP   CODE STATUS: DNR  No Known Allergies  Chief Complaint  Patient presents with   Acute Visit    Care plan meeting    HPI:  We have come together for her care plan meeting. BIMS 8/15; mood 7/30. She is nonambulatory has had 3 falls without injury. She has had multiple seizures requiring medication adjustments.her sieuzres started after her cva.  She will go to neurology on 11-15-21. She requires extensive assist with her adls. She is occasionally incontinent of bladder and bowel. Dietary: weight is 122.4 pounds on regular diet feeds self appetite is improving. Therapy none at this time. Activities does not attend due to "poor energy". She stays in her room. 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  Rheumatoid arthritis involving multiple sites with positive rheumatoid factor Chronic kidney disease (CKD) stage 4  Systemic lupus erythematosus unspecified SLE type; unspecified organ involvement status      Past Medical History:  Diagnosis Date   Arthritis    Rheumatoid   Dysphagia    post CVA   Hypertension    Lupus (systemic lupus erythematosus) ([place])    Seizure ([place])    Stroke [facility])     Past Surgical History:  Procedure Laterality Date   BIOPSY  02/21/2022   Procedure: BIOPSY;  Surgeon: [name], DO;  [facility];  Service: Endoscopy;;   CESAREAN SECTION     COLONOSCOPY WITH PROPOFOL N/A 02/21/2022   Procedure: COLONOSCOPY WITH PROPOFOL;  Surgeon: [name], DO;  [facility];  Service: Endoscopy;  Laterality: N/A;  2:00pm   ESOPHAGEAL BRUSHING  02/21/2022   Procedure: ESOPHAGEAL BRUSHING;  Surgeon: [name], DO;  [facility];  Service: Endoscopy;;   ESOPHAGOGASTRODUODENOSCOPY (EGD) WITH PROPOFOL N/A 02/21/2022   Procedure:  ESOPHAGOGASTRODUODENOSCOPY (EGD) WITH PROPOFOL;  Surgeon: [name], DO;  [facility];  Service: Endoscopy;  Laterality: N/A;   POLYPECTOMY  02/21/2022   Procedure: POLYPECTOMY;  Surgeon: [name], DO;  [facility];  Service: Endoscopy;;    Social History   Socioeconomic History   Marital status: Single    Spouse name: Not on file   Number of children: Not on file   Years of education: Not on file   Highest education level: Not on file  Occupational History   Not on file  Tobacco Use   Smoking status: Never   Smokeless tobacco: Never  Vaping Use   Vaping Use: Never used  Substance and Sexual Activity   Alcohol use: No    Alcohol/week: 0.0 standard drinks of alcohol   Drug use: No   Sexual activity: Not on file  Other Topics Concern   Not on file  Social History Narrative   Lives with sister.     Social Determinants of Health   Financial Resource Strain: Not on file  Food Insecurity: Not on file  Transportation Needs: Not on file  Physical Activity: Not on file  Stress: Not on file  Social Connections: Not on file  Intimate Partner Violence: Not on file   Family History  Problem Relation Age of Onset   Diabetes Mother    Hypertension Mother    Cancer Mother    Stroke Mother    Colon cancer Father  Hypertension Sister    Hypertension Sister    Hypertension Sister    Hypertension Sister    Hypertension Sister    Hypertension Sister    Hypertension Brother    Hypertension Brother       VITAL SIGNS BP 121/66   Pulse 60   Temp 97.8 F (36.6 C)   Resp 20   Ht '5\' 6"'$  (1.676 m)   Wt 122 lb 6.4 oz (55.5 kg)   SpO2 95%   BMI 19.76 kg/m   Outpatient Encounter Medications as of 04/12/2022  Medication Sig   acetaminophen (TYLENOL) 325 MG tablet Take 650 mg by mouth every 6 (six) hours as needed.   aspirin 81 MG chewable tablet Chew 81 mg by mouth daily.   atenolol (TENORMIN) 25 MG tablet Take 12.5 mg by mouth daily.    Balsam [place]-Castor Oil (VENELEX) OINT Apply to sacrum, bilateral buttocks and coccyx three times daily.   Ensure Plus (ENSURE PLUS) LIQD Take 237 mLs by mouth 3 (three) times daily between meals.   Lacosamide (VIMPAT) 100 MG TABS Take 1 tablet (100 mg total) by mouth in the morning and at bedtime.   levETIRAcetam (KEPPRA) 100 MG/ML solution Take 750 mg by mouth 2 (two) times daily.   levothyroxine (SYNTHROID) 75 MCG tablet Take 75 mcg by mouth daily before breakfast.   LORazepam (ATIVAN) 2 MG/ML concentrated solution '1mg'$  as needed for continued seizure activity up to 3 doses max. If still having seizure activity after 3 doses, send to ER.   ondansetron (ZOFRAN-ODT) 8 MG disintegrating tablet Take 8 mg by mouth. Before meals and at bedtime   pantoprazole (PROTONIX) 40 MG tablet Take 40 mg by mouth 2 (two) times daily.   predniSONE (DELTASONE) 10 MG tablet Take 10 mg by mouth daily with breakfast.   sennosides-docusate sodium (SENOKOT-S) 8.6-50 MG tablet Take 1 tablet by mouth 2 (two) times daily.   sertraline (ZOLOFT) 50 MG tablet Take 50 mg by mouth daily.   ticagrelor (BRILINTA) 90 MG TABS tablet Take 90 mg by mouth 2 (two) times daily.   UNABLE TO FIND Diet: Regular diet with thin liquids   [DISCONTINUED] atenolol (TENORMIN) 100 MG tablet Take 100 mg by mouth 2 (two) times daily. (Patient not taking: Reported on 04/10/2022)   No facility-administered encounter medications on file as of 04/12/2022.     SIGNIFICANT DIAGNOSTIC EXAMS  PREVIOUS   04-09-22: right humerus x-ray:No acute fracture or dislocation of the humerus or forearm.   04-09-22: pelvic x-ray  No evidence of acute hip fracture. Moderate bilateral hip osteoarthritis.  04-09-22: ct of head:  1.  No acute intracranial process. 2.  Left parietal vertex scalp hematoma.  04-09-22: ct of cervical spine:  No evidence of acute cervical spine fracture or traumatic listhesis.   NO NEW EXAMS    LABS REVIEWED:   12-14-21; wbc  3.4; hgb 8.9; hct 29.2; plt 723; glucose 80; bun 20; creat 1.36; k+ 5.2; na++ 136; ca 8.7  12-18-21: wbc 4.3; hgb 8.7; hct 28.8; plt 710; glucose 83; bun 20; creat 1.36;  k+ 4.1; na++ 141; ca 8.8  01-10-22: wbc 4.5; hgb 8.0; hct 25.8; mcv 82.2 plt 603; glucose 76; bun 19; creat 1.28; k+ 3.6; na++ 1140; ca 8.5; gfr 46; protein 7.0; albumin 2.3; chol 98; ldl 48; trig 151; hdl 20; tsh 2.976 free t4: 1.00; iron 23; tibc 135 02-18-22: hgb 7.7; hct 24.7; glucose 47; bun 9; creat 0.80; k+ 3.0; na++ 136; ca 7.6; gfr>60;  protein 5.8; albumin 1.9 02-19-22: wbc 3.2; hgb 8.3; hct 26.0; mcv 82.5 plt 141; k+ 2.6  02-20-22: k+ 4.7  03-05-22: keppra level 42.0 (10-40 normal) 03-26-22: wbc 4.2; hgb 8.2; hct 26.3; mcv 86.8 plt 508; glucose 78; bun 9; creat 0.84; k+ 2.4; na++ 136; ca 7.6; gfr>60; protein 6.5; albumin 2.1; tsh 4.826; sed rate: 82 ANA+ 03-28-22: k+ 4.0 03-31-22: RPR nr; HIV nr 04-09-22: wbc 6.3; hgb 9.2 hct 30.1 mcv 91.8 plt 440; glucose 101; bun 12; creat 1.17; k + 3.5; na++ 134; ca 8.2; gfr 51  NO NEW LABS.    Review of Systems  Constitutional:  Negative for malaise/fatigue.  Respiratory:  Negative for cough and shortness of breath.   Cardiovascular:  Negative for chest pain, palpitations and leg swelling.  Gastrointestinal:  Negative for abdominal pain, constipation and heartburn.  Musculoskeletal:  Negative for back pain, joint pain and myalgias.  Skin: Negative.   Neurological:  Negative for dizziness.  Psychiatric/Behavioral:  The patient is not nervous/anxious.    Physical Exam Constitutional:      General: She is not in acute distress.    Appearance: She is well-developed. She is not diaphoretic.  Neck:     Thyroid: No thyromegaly.  Cardiovascular:     Rate and Rhythm: Normal rate and regular rhythm.     Pulses: Normal pulses.     Heart sounds: Normal heart sounds.  Pulmonary:     Effort: Pulmonary effort is normal. No respiratory distress.     Breath sounds: Normal breath sounds.   Abdominal:     General: Bowel sounds are normal. There is no distension.     Palpations: Abdomen is soft.     Tenderness: There is no abdominal tenderness.  Musculoskeletal:        General: Normal range of motion.     Cervical back: Neck supple.     Right lower leg: No edema.     Left lower leg: No edema.  Lymphadenopathy:     Cervical: No cervical adenopathy.  Skin:    General: Skin is warm and dry.  Neurological:     Mental Status: She is alert. Mental status is at baseline.  Psychiatric:        Mood and Affect: Mood normal.       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    Time spent with patient: 40 minutes: medications; cognition; plan of care.     Ok Edwards NP [facility]   call [phone]

## 2022-04-15 ENCOUNTER — Non-Acute Institutional Stay (SKILLED_NURSING_FACILITY): Payer: Medicare Other | Admitting: Adult Health

## 2022-04-15 ENCOUNTER — Encounter: Payer: Self-pay | Admitting: Adult Health

## 2022-04-15 DIAGNOSIS — E43 Unspecified severe protein-calorie malnutrition: Secondary | ICD-10-CM

## 2022-04-15 DIAGNOSIS — M329 Systemic lupus erythematosus, unspecified: Secondary | ICD-10-CM

## 2022-04-15 DIAGNOSIS — I69398 Other sequelae of cerebral infarction: Secondary | ICD-10-CM

## 2022-04-15 DIAGNOSIS — I10 Essential (primary) hypertension: Secondary | ICD-10-CM | POA: Diagnosis not present

## 2022-04-15 DIAGNOSIS — R569 Unspecified convulsions: Secondary | ICD-10-CM | POA: Diagnosis not present

## 2022-04-15 NOTE — Progress Notes (Unsigned)
Location:  [place] Room Number: S/157/W Place of Service:  SNF (31) Nyoka Cowden, Phylis Bougie, NP  CODE STATUS: DNR  No Known Allergies  Chief Complaint  Patient presents with   Medical Management of Chronic Issues    Patient is here for a follow up for chronic conditions    Quality Metric Gaps    Patient is also due for mammogram, dexa scan, 2nd Shingrix, and COVID booster    HPI:    Past Medical History:  Diagnosis Date   Arthritis    Rheumatoid   Dysphagia    post CVA   Hypertension    Lupus (systemic lupus erythematosus) ([place])    Seizure ([place])    Stroke [facility])     Past Surgical History:  Procedure Laterality Date   BIOPSY  02/21/2022   Procedure: BIOPSY;  Surgeon: [name], DO;  [facility];  Service: Endoscopy;;   CESAREAN SECTION     COLONOSCOPY WITH PROPOFOL N/A 02/21/2022   Procedure: COLONOSCOPY WITH PROPOFOL;  Surgeon: [name], DO;  [facility];  Service: Endoscopy;  Laterality: N/A;  2:00pm   ESOPHAGEAL BRUSHING  02/21/2022   Procedure: ESOPHAGEAL BRUSHING;  Surgeon: [name], DO;  [facility];  Service: Endoscopy;;   ESOPHAGOGASTRODUODENOSCOPY (EGD) WITH PROPOFOL N/A 02/21/2022   Procedure: ESOPHAGOGASTRODUODENOSCOPY (EGD) WITH PROPOFOL;  Surgeon: [name], DO;  [facility];  Service: Endoscopy;  Laterality: N/A;   POLYPECTOMY  02/21/2022   Procedure: POLYPECTOMY;  Surgeon: [name], DO;  [facility];  Service: Endoscopy;;    Social History   Socioeconomic History   Marital status: Single    Spouse name: Not on file   Number of children: Not on file   Years of education: Not on file   Highest education level: Not on file  Occupational History   Not on file  Tobacco Use   Smoking status: Never   Smokeless tobacco: Never  Vaping Use   Vaping Use: Never used  Substance and Sexual Activity   Alcohol use: No    Alcohol/week: 0.0  standard drinks of alcohol   Drug use: No   Sexual activity: Not on file  Other Topics Concern   Not on file  Social History Narrative   Lives with sister.     Social Determinants of Health   Financial Resource Strain: Not on file  Food Insecurity: Not on file  Transportation Needs: Not on file  Physical Activity: Not on file  Stress: Not on file  Social Connections: Not on file  Intimate Partner Violence: Not on file   Family History  Problem Relation Age of Onset   Diabetes Mother    Hypertension Mother    Cancer Mother    Stroke Mother    Colon cancer Father    Hypertension Sister    Hypertension Sister    Hypertension Sister    Hypertension Sister    Hypertension Sister    Hypertension Sister    Hypertension Brother    Hypertension Brother       VITAL SIGNS BP (!) 128/52   Pulse 71   Temp 97.6 F (36.4 C)   Resp 20   Ht '5\' 6"'$  (1.676 m)   Wt 122 lb 6.4 oz (55.5 kg)   SpO2 100%   BMI 19.76 kg/m   Outpatient Encounter Medications as of 04/15/2022  Medication Sig   acetaminophen (TYLENOL) 325 MG tablet  Take 650 mg by mouth every 6 (six) hours as needed.   aspirin 81 MG chewable tablet Chew 81 mg by mouth daily.   atenolol (TENORMIN) 25 MG tablet Take 12.5 mg by mouth daily.   Balsam [place]-Castor Oil (VENELEX) OINT Apply to sacrum, bilateral buttocks and coccyx three times daily.   Ensure Plus (ENSURE PLUS) LIQD Take 237 mLs by mouth 3 (three) times daily between meals.   Lacosamide (VIMPAT) 100 MG TABS Take 1 tablet (100 mg total) by mouth in the morning and at bedtime.   levETIRAcetam (KEPPRA) 100 MG/ML solution Take 750 mg by mouth 2 (two) times daily.   levothyroxine (SYNTHROID) 75 MCG tablet Take 75 mcg by mouth daily before breakfast.   LORazepam (ATIVAN) 2 MG/ML concentrated solution '1mg'$  as needed for continued seizure activity up to 3 doses max. If still having seizure activity after 3 doses, send to ER.   ondansetron (ZOFRAN-ODT) 8 MG disintegrating  tablet Take 8 mg by mouth. Before meals and at bedtime   pantoprazole (PROTONIX) 40 MG tablet Take 40 mg by mouth 2 (two) times daily.   predniSONE (DELTASONE) 10 MG tablet Take 10 mg by mouth daily with breakfast.   sennosides-docusate sodium (SENOKOT-S) 8.6-50 MG tablet Take 1 tablet by mouth 2 (two) times daily.   sertraline (ZOLOFT) 50 MG tablet Take 50 mg by mouth daily.   ticagrelor (BRILINTA) 90 MG TABS tablet Take 90 mg by mouth 2 (two) times daily.   UNABLE TO FIND Diet: Regular diet with thin liquids   No facility-administered encounter medications on file as of 04/15/2022.     SIGNIFICANT DIAGNOSTIC EXAMS       ASSESSMENT/ PLAN:     Ok Edwards NP [facility]  Contact [phone] Monday through Friday 8am- 5pm  After hours call [phone]

## 2022-04-17 ENCOUNTER — Ambulatory Visit (INDEPENDENT_AMBULATORY_CARE_PROVIDER_SITE_OTHER): Payer: Medicare Other | Admitting: Neurology

## 2022-04-17 ENCOUNTER — Encounter: Payer: Self-pay | Admitting: Neurology

## 2022-04-17 VITALS — BP 137/90 | HR 80 | Ht 67.0 in

## 2022-04-17 DIAGNOSIS — G40909 Epilepsy, unspecified, not intractable, without status epilepticus: Secondary | ICD-10-CM

## 2022-04-17 DIAGNOSIS — R634 Abnormal weight loss: Secondary | ICD-10-CM

## 2022-04-17 DIAGNOSIS — I6389 Other cerebral infarction: Secondary | ICD-10-CM

## 2022-04-17 DIAGNOSIS — Z5181 Encounter for therapeutic drug level monitoring: Secondary | ICD-10-CM | POA: Diagnosis not present

## 2022-04-17 DIAGNOSIS — E039 Hypothyroidism, unspecified: Secondary | ICD-10-CM

## 2022-04-17 DIAGNOSIS — R7989 Other specified abnormal findings of blood chemistry: Secondary | ICD-10-CM | POA: Diagnosis not present

## 2022-04-17 MED ORDER — DIVALPROEX SODIUM 500 MG PO DR TAB: 500.0000 mg | DELAYED_RELEASE_TABLET | Freq: Two times a day (BID) | ORAL | 6 refills | Status: DC

## 2022-04-17 NOTE — Progress Notes (Signed)
[facility]  PATIENT: Rebekah [name] DOB: 02/13/1956  REQUESTING CLINICIAN: Gerlene Fee, NP HISTORY FROM: Patient and family (sisters and son)  REASON FOR VISIT: New onset seizures    HISTORICAL  CHIEF COMPLAINT:  Chief Complaint  Patient presents with   New Patient (Initial Visit)    Rm 13. Accompanied by sisters, Estill Bamberg and Silva Bandy. NP Internal referral for new onset seizures, post CVA.    HISTORY OF PRESENT ILLNESS:  This is a [age]-year-old woman with past medical history of systemic lupus erythematous, CVA back in August 2022 and again in March 2023, post stroke seizures, with arthritis, hypothyroidism and dysphagia who is presenting for seizure management.  Patient reported seizure started treatment to go after being diagnosed with multiple strokes, he is on both levetiracetam and Vimpat but still having seizures, last seizure was on July 28.  Seizures are described as staring, but most of the time patient is found on the floor, sometimes confused.  She reports after each seizure she feels very drained.  He denies any previously history of seizures, states that her son has seizures but received her seizure started after her strokes.    OTHER MEDICAL CONDITIONS: SLE, CVA, Seizures, Polyarthritis, Dysphasia, Hypothyroidism    REVIEW OF SYSTEMS: Full 14 system review of systems performed and negative with exception of: As noted in the HPI   ALLERGIES: No Known Allergies  HOME MEDICATIONS: Outpatient Medications Prior to Visit  Medication Sig Dispense Refill   acetaminophen (TYLENOL) 325 MG tablet Take 650 mg by mouth every 6 (six) hours as needed.     aspirin 81 MG chewable tablet Chew 81 mg by mouth daily.     atenolol (TENORMIN) 25 MG tablet Take 12.5 mg by mouth daily.     Balsam [place]-Castor Oil (VENELEX) OINT Apply to sacrum, bilateral buttocks and coccyx three times daily.     Ensure Plus (ENSURE PLUS) LIQD Take 237 mLs by mouth 3  (three) times daily between meals.     levETIRAcetam (KEPPRA) 100 MG/ML solution Take 750 mg by mouth 2 (two) times daily.     levothyroxine (SYNTHROID) 75 MCG tablet Take 75 mcg by mouth daily before breakfast.     LORazepam (ATIVAN) 2 MG/ML concentrated solution '1mg'$  as needed for continued seizure activity up to 3 doses max. If still having seizure activity after 3 doses, send to ER.     ondansetron (ZOFRAN-ODT) 8 MG disintegrating tablet Take 8 mg by mouth. Before meals and at bedtime     pantoprazole (PROTONIX) 40 MG tablet Take 40 mg by mouth 2 (two) times daily.     predniSONE (DELTASONE) 10 MG tablet Take 10 mg by mouth daily with breakfast.     sennosides-docusate sodium (SENOKOT-S) 8.6-50 MG tablet Take 1 tablet by mouth 2 (two) times daily.     sertraline (ZOLOFT) 50 MG tablet Take 50 mg by mouth daily.     ticagrelor (BRILINTA) 90 MG TABS tablet Take 90 mg by mouth 2 (two) times daily.     UNABLE TO FIND Diet: Regular diet with thin liquids     Lacosamide (VIMPAT) 100 MG TABS Take 1 tablet (100 mg total) by mouth in the morning and at bedtime. 60 tablet 0   No facility-administered medications prior to visit.    PAST MEDICAL HISTORY: Past Medical History:  Diagnosis Date   Arthritis    Rheumatoid   Dysphagia    post CVA   Hypertension    Lupus (  systemic lupus erythematosus) ([place])    Seizure ([place])    Stroke [facility])     PAST SURGICAL HISTORY: Past Surgical History:  Procedure Laterality Date   BIOPSY  02/21/2022   Procedure: BIOPSY;  Surgeon: [name], DO;  [facility];  Service: Endoscopy;;   CESAREAN SECTION     COLONOSCOPY WITH PROPOFOL N/A 02/21/2022   Procedure: COLONOSCOPY WITH PROPOFOL;  Surgeon: [name], DO;  [facility];  Service: Endoscopy;  Laterality: N/A;  2:00pm   ESOPHAGEAL BRUSHING  02/21/2022   Procedure: ESOPHAGEAL BRUSHING;  Surgeon: [name], DO;  [facility];  Service: Endoscopy;;    ESOPHAGOGASTRODUODENOSCOPY (EGD) WITH PROPOFOL N/A 02/21/2022   Procedure: ESOPHAGOGASTRODUODENOSCOPY (EGD) WITH PROPOFOL;  Surgeon: [name], DO;  [facility];  Service: Endoscopy;  Laterality: N/A;   POLYPECTOMY  02/21/2022   Procedure: POLYPECTOMY;  Surgeon: [name], DO;  [facility];  Service: Endoscopy;;    FAMILY HISTORY: Family History  Problem Relation Age of Onset   Diabetes Mother    Hypertension Mother    Cancer Mother    Stroke Mother    Colon cancer Father    Hypertension Sister    Hypertension Sister    Hypertension Sister    Hypertension Sister    Hypertension Sister    Hypertension Sister    Hypertension Brother    Hypertension Brother     SOCIAL HISTORY: Social History   Socioeconomic History   Marital status: Single    Spouse name: Not on file   Number of children: Not on file   Years of education: Not on file   Highest education level: Not on file  Occupational History   Not on file  Tobacco Use   Smoking status: Never   Smokeless tobacco: Never  Vaping Use   Vaping Use: Never used  Substance and Sexual Activity   Alcohol use: No    Alcohol/week: 0.0 standard drinks of alcohol   Drug use: No   Sexual activity: Not on file  Other Topics Concern   Not on file  Social History Narrative   Lives with sister.     Social Determinants of Health   Financial Resource Strain: Not on file  Food Insecurity: Not on file  Transportation Needs: Not on file  Physical Activity: Not on file  Stress: Not on file  Social Connections: Not on file  Intimate Partner Violence: Not on file    PHYSICAL EXAM  GENERAL EXAM/CONSTITUTIONAL: Vitals:  Vitals:   04/17/22 1043  BP: (!) 137/90  Pulse: 80  Height: '5\' 7"'$  (1.702 m)   Body mass index is 19.17 kg/m. Wt Readings from Last 3 Encounters:  04/15/22 122 lb 6.4 oz (55.5 kg)  04/12/22 122 lb 6.4 oz (55.5 kg)  04/10/22 122 lb 6.4 oz (55.5 kg)   Patient is in no  distress; well developed, nourished and groomed; neck is supple, sitting in her wheelchair   EYES: Pupils round and reactive to light, Visual fields full to confrontation, Extraocular movements intacts,   MUSCULOSKELETAL: Gait, strength, tone, movements noted in Neurologic exam below  NEUROLOGIC: MENTAL STATUS:     02/27/2022    2:52 PM  MMSE - Mini Mental State Exam  Orientation to time 5  Orientation to Place 5  Registration 3  Attention/ Calculation 5  Recall 3  Language- name 2 objects 2  Language- repeat 1  Language- follow 3 step command  3  Language- read & follow direction 1  Write a sentence 0  Copy design 0  Total score 28   awake, alert, oriented to person and year but not month or day  recent and remote memory intact Difficulty with stating the days of the week backward but was able to calculate the number of quarter in $1.75 language fluent, comprehension intact, Difficulty with naming knuckles    CRANIAL NERVE:  2nd, 3rd, 4th, 6th - pupils equal and reactive to light, visual fields full to confrontation, extraocular muscles intact, no nystagmus 5th - facial sensation symmetric 7th - facial strength symmetric 8th - hearing intact 9th - palate elevates symmetrically, uvula midline 11th - shoulder shrug symmetric 12th - tongue protrusion midline  MOTOR:  Decrease bulk, appears cachectic, at least antigravity in all 4 extremities   SENSORY:  normal and symmetric to light touch  COORDINATION:  finger-nose-finger  GAIT/STATION:  Deferred    DIAGNOSTIC DATA (LABS, IMAGING, TESTING) - I reviewed patient records, labs, notes, testing and imaging myself where available.  Lab Results  Component Value Date   WBC 6.3 04/09/2022   HGB 9.2 (L) 04/09/2022   HCT 30.1 (L) 04/09/2022   MCV 91.8 04/09/2022   PLT 490 (H) 04/09/2022      Component Value Date/Time   NA 134 (L) 04/09/2022 1618   NA 139 11/13/2021 1559   K 3.5 04/09/2022 1618   CL 104  04/09/2022 1618   CO2 22 04/09/2022 1618   GLUCOSE 101 (H) 04/09/2022 1618   BUN 12 04/09/2022 1618   BUN 17 11/13/2021 1559   CREATININE 1.17 (H) 04/09/2022 1618   CALCIUM 8.2 (L) 04/09/2022 1618   PROT 6.5 03/26/2022 0807   PROT 7.3 11/13/2021 1559   ALBUMIN 2.1 (L) 03/26/2022 0807   ALBUMIN 3.5 (L) 11/13/2021 1559   AST 16 03/26/2022 0807   ALT 7 03/26/2022 0807   ALKPHOS 62 03/26/2022 0807   BILITOT 0.7 03/26/2022 0807   BILITOT 0.4 11/13/2021 1559   GFRNONAA 51 (L) 04/09/2022 1618   GFRAA 47 (L) 10/05/2018 0215   Lab Results  Component Value Date   CHOL 98 01/10/2022   HDL 20 (L) 01/10/2022   LDLCALC 48 01/10/2022   TRIG 151 (H) 01/10/2022   CHOLHDL 4.9 01/10/2022   No results found for: "HGBA1C" No results found for: "VITAMINB12" Lab Results  Component Value Date   TSH 4.826 (H) 03/26/2022    MRI Brain 12/03/21 1. Multiple small acute (and/or early subacute) cortical and subcortical infarctions in the left occipital lobe, left parietal lobe, and posterior left frontal lobe.  2. These recent infarctions are superimposed on a few old small infarctions in the left occipital and left parietal lobes. There is also an old infarction in the posterior left cerebellar hemisphere. .  MRA Head 12/04/21 1.  No large vessel occlusion or significant proximal stenosis.  2.  No aneurysm appreciated.    US Carotid Bilateral 12/04/21 1.  No hemodynamically significant stenosis on either side.  2.  Scattered mild carotid plaque.  3.  Both vertebral arteries are patent with antegrade flow.    ASSESSMENT AND PLAN  66 y.o. year old female with history of SLE, hypertension, hypothyroidism, strokes and seizures who is presenting for management of her seizures.  Patient reports seizures started 3 months ago and since then have been intractable.  Currently she is on Keppra 750 mg twice daily and lacosamide 100 mg twice daily.  Due to her kidney function,  I will hold increasing the Keppra.   Family and patient was have reported weight loss and decreased appetite and I think a good medication for patient will be Depakote because he had a side effect of weight gain.  We will discontinue Vimpat and start patient on Depakote 500 mg twice daily.  In the event that she has a breakthrough seizure, my recommendation will be to increase the Depakote to 1000 mg twice daily.  I will also obtain a routine EEG and we will follow-up with patient in in 3 months or sooner if worse.  They are comfortable with plan. She has a history of hypothyroidism, her last TSH back in July was abnormal at 4.826.  At this time I will repeat a TSH and if abnormal we will contact the patient primary provider NP [place].     1. Seizure disorder ([place])   2. Cerebrovascular accident (CVA) due to other mechanism ([place])   3. Hypothyroidism, unspecified type   4. Weight loss      Patient Instructions  Continue with Keppra 750 mg twice daily, will obtain a level today Discontinue Vimpat  Start with Depakote 500 mg twice daily, please increase to 1000 mg twice daily if she has a breakthough seizure  Will obtain a routine EEG  Follow up in 3 months or sooner if worse   Orders Placed This Encounter  Procedures   Levetiracetam level   TSH   EEG adult    Meds ordered this encounter  Medications   divalproex (DEPAKOTE) 500 MG DR tablet    Sig: Take 1 tablet (500 mg total) by mouth 2 (two) times daily.    Dispense:  60 tablet    Refill:  6    Return in about 3 months (around 07/18/2022).    Alric Ran, MD 04/17/2022, 1:41 PM  [facility] [address], [place], [place] [address] [phone]

## 2022-04-17 NOTE — Patient Instructions (Signed)
Continue with Keppra 750 mg twice daily, will obtain a level today Discontinue Vimpat  Start with Depakote 500 mg twice daily, please increase to 1000 mg twice daily if she has a breakthough seizure  Will obtain a routine EEG  Follow up in 3 months or sooner if worse

## 2022-04-18 ENCOUNTER — Other Ambulatory Visit (HOSPITAL_COMMUNITY)
Admission: RE | Admit: 2022-04-18 | Discharge: 2022-04-18 | Disposition: A | Discharge status: Home / Self Care | Payer: Medicare Other | Source: Skilled Nursing Facility | Attending: Adult Health | Admitting: Adult Health

## 2022-04-18 DIAGNOSIS — M329 Systemic lupus erythematosus, unspecified: Secondary | ICD-10-CM | POA: Insufficient documentation

## 2022-04-18 LAB — SEDIMENTATION RATE: Sed Rate: 30 mm/hr — ABNORMAL HIGH (ref 0–22)

## 2022-04-18 LAB — LEVETIRACETAM LEVEL: Levetiracetam Lvl: 43.9 ug/mL — ABNORMAL HIGH (ref 10.0–40.0)

## 2022-04-18 LAB — TSH: TSH: 1.9 u[IU]/mL (ref 0.450–4.500)

## 2022-04-22 ENCOUNTER — Non-Acute Institutional Stay (SKILLED_NURSING_FACILITY): Payer: Medicare Other | Admitting: Adult Health

## 2022-04-22 DIAGNOSIS — F339 Major depressive disorder, recurrent, unspecified: Secondary | ICD-10-CM

## 2022-04-22 DIAGNOSIS — R630 Anorexia: Secondary | ICD-10-CM

## 2022-04-22 DIAGNOSIS — R1312 Dysphagia, oropharyngeal phase: Secondary | ICD-10-CM | POA: Diagnosis not present

## 2022-04-22 NOTE — Progress Notes (Signed)
Location:  [place] Room Number: 157-W Place of Service:  SNF (31)   CODE STATUS: DNR  No Known Allergies  Chief Complaint  Patient presents with   Acute Visit    Weight Loss    HPI:  She continues to loose weight. On 02-14-22: 130.2 pounds; 03-20-22: 122.4 pounds; 04-18-22: 111.2 pounds. She does eat food that is brought in by family; unfortunately that is not every meal and is not enough to sustain life. She does complain of depression she rarely gets out of bed; her room is dark. She tells me that she will get hungry but when she looks at food she cannot eat. She is not afraid of eating; due to nausea and vomiting. She states that at times she feels overwhelmed with the food. She is willing to try a full liquid tray to see if this will help her appetite. She is welcome to eat whatever she wants. She denies any suicidal ideation.  She has failed remeron; marinol therapy to help with appetite.  She is presently on prednisone 10 mg daily through 04-29-22 for inflammation.   Past Medical History:  Diagnosis Date   Arthritis    Rheumatoid   Dysphagia    post CVA   Hypertension    Lupus (systemic lupus erythematosus) ([place])    Seizure ([place])    Stroke [facility])     Past Surgical History:  Procedure Laterality Date   BIOPSY  02/21/2022   Procedure: BIOPSY;  Surgeon: [name], DO;  [facility];  Service: Endoscopy;;   CESAREAN SECTION     COLONOSCOPY WITH PROPOFOL N/A 02/21/2022   Procedure: COLONOSCOPY WITH PROPOFOL;  Surgeon: [name], DO;  [facility];  Service: Endoscopy;  Laterality: N/A;  2:00pm   ESOPHAGEAL BRUSHING  02/21/2022   Procedure: ESOPHAGEAL BRUSHING;  Surgeon: [name], DO;  [facility];  Service: Endoscopy;;   ESOPHAGOGASTRODUODENOSCOPY (EGD) WITH PROPOFOL N/A 02/21/2022   Procedure: ESOPHAGOGASTRODUODENOSCOPY (EGD) WITH PROPOFOL;  Surgeon: [name], DO;  [facility];   Service: Endoscopy;  Laterality: N/A;   POLYPECTOMY  02/21/2022   Procedure: POLYPECTOMY;  Surgeon: [name], DO;  [facility];  Service: Endoscopy;;    Social History   Socioeconomic History   Marital status: Single    Spouse name: Not on file   Number of children: Not on file   Years of education: Not on file   Highest education level: Not on file  Occupational History   Not on file  Tobacco Use   Smoking status: Never   Smokeless tobacco: Never  Vaping Use   Vaping Use: Never used  Substance and Sexual Activity   Alcohol use: No    Alcohol/week: 0.0 standard drinks of alcohol   Drug use: No   Sexual activity: Not on file  Other Topics Concern   Not on file  Social History Narrative   Lives with sister.     Social Determinants of Health   Financial Resource Strain: Not on file  Food Insecurity: Not on file  Transportation Needs: Not on file  Physical Activity: Not on file  Stress: Not on file  Social Connections: Not on file  Intimate Partner Violence: Not on file   Family History  Problem Relation Age of Onset   Diabetes Mother    Hypertension Mother    Cancer Mother    Stroke Mother    Colon cancer  Father    Hypertension Sister    Hypertension Sister    Hypertension Sister    Hypertension Sister    Hypertension Sister    Hypertension Sister    Hypertension Brother    Hypertension Brother       VITAL SIGNS BP (!) 113/52   Pulse 66   Temp 98 F (36.7 C)   Resp 16   Ht '5\' 6"'$  (1.676 m)   Wt 111 lb 3.2 oz (50.4 kg)   SpO2 97%   BMI 17.95 kg/m   Outpatient Encounter Medications as of 04/22/2022  Medication Sig   acetaminophen (TYLENOL) 325 MG tablet Take 650 mg by mouth every 6 (six) hours as needed.   Amino Acids-Protein Hydrolys (FEEDING SUPPLEMENT, PRO-STAT SUGAR FREE 64,) LIQD 15-100 gram-kcal/30 mL Special Instructions: 03-26-22: protein 6.5 albumin 2.1 With Meals   aspirin 81 MG chewable tablet Chew 81 mg by mouth daily.    atenolol (TENORMIN) 25 MG tablet Take 12.5 mg by mouth daily.   Balsam [place]-Castor Oil (VENELEX) OINT Apply to sacrum, bilateral buttocks and coccyx three times daily.   divalproex (DEPAKOTE) 500 MG DR tablet Take 1 tablet (500 mg total) by mouth 2 (two) times daily.   Ensure Plus (ENSURE PLUS) LIQD Take 237 mLs by mouth 3 (three) times daily between meals.   levETIRAcetam (KEPPRA) 100 MG/ML solution Take 750 mg by mouth 2 (two) times daily.   levothyroxine (SYNTHROID) 75 MCG tablet Take 75 mcg by mouth daily before breakfast.   LORazepam (ATIVAN) 2 MG/ML concentrated solution '1mg'$  as needed for continued seizure activity up to 3 doses max. If still having seizure activity after 3 doses, send to ER.   ondansetron (ZOFRAN-ODT) 8 MG disintegrating tablet Take 8 mg by mouth. Before meals and at bedtime   pantoprazole (PROTONIX) 40 MG tablet Take 40 mg by mouth 2 (two) times daily.   predniSONE (DELTASONE) 20 MG tablet Take 20 mg by mouth daily. for inflammation   sennosides-docusate sodium (SENOKOT-S) 8.6-50 MG tablet Take 1 tablet by mouth 2 (two) times daily.   sertraline (ZOLOFT) 50 MG tablet Take 50 mg by mouth daily.   ticagrelor (BRILINTA) 90 MG TABS tablet Take 90 mg by mouth 2 (two) times daily.   UNABLE TO FIND Diet: Regular diet with thin liquids   [DISCONTINUED] predniSONE (DELTASONE) 10 MG tablet Take 10 mg by mouth daily with breakfast.   No facility-administered encounter medications on file as of 04/22/2022.     SIGNIFICANT DIAGNOSTIC EXAMS  PREVIOUS   04-09-22: right humerus x-ray:No acute fracture or dislocation of the humerus or forearm.   04-09-22: pelvic x-ray  No evidence of acute hip fracture. Moderate bilateral hip osteoarthritis.  04-09-22: ct of head:  1.  No acute intracranial process. 2.  Left parietal vertex scalp hematoma.  04-09-22: ct of cervical spine:  No evidence of acute cervical spine fracture or traumatic listhesis.   NO NEW EXAMS    LABS REVIEWED:    12-14-21; wbc 3.4; hgb 8.9; hct 29.2; plt 723; glucose 80; bun 20; creat 1.36; k+ 5.2; na++ 136; ca 8.7  12-18-21: wbc 4.3; hgb 8.7; hct 28.8; plt 710; glucose 83; bun 20; creat 1.36;  k+ 4.1; na++ 141; ca 8.8  01-10-22: wbc 4.5; hgb 8.0; hct 25.8; mcv 82.2 plt 603; glucose 76; bun 19; creat 1.28; k+ 3.6; na++ 1140; ca 8.5; gfr 46; protein 7.0; albumin 2.3; chol 98; ldl 48; trig 151; hdl 20; tsh 2.976 free t4: 1.00; iron  23; tibc 135 02-18-22: hgb 7.7; hct 24.7; glucose 47; bun 9; creat 0.80; k+ 3.0; na++ 136; ca 7.6; gfr>60; protein 5.8; albumin 1.9 02-19-22: wbc 3.2; hgb 8.3; hct 26.0; mcv 82.5 plt 141; k+ 2.6  02-20-22: k+ 4.7  03-05-22: keppra level 42.0 (10-40 normal) 03-26-22: wbc 4.2; hgb 8.2; hct 26.3; mcv 86.8 plt 508; glucose 78; bun 9; creat 0.84; k+ 2.4; na++ 136; ca 7.6; gfr>60; protein 6.5; albumin 2.1; tsh 4.826; sed rate: 82 ANA+ 03-28-22: k+ 4.0 03-31-22: RPR nr; HIV nr 04-09-22: wbc 6.3; hgb 9.2 hct 30.1 mcv 91.8 plt 440; glucose 101; bun 12; creat 1.17; k + 3.5; na++ 134; ca 8.2; gfr 51  NO NEW LABS.    Review of Systems  Constitutional:  Positive for malaise/fatigue.  Respiratory:  Negative for cough and shortness of breath.   Cardiovascular:  Negative for chest pain, palpitations and leg swelling.  Gastrointestinal:  Negative for abdominal pain, constipation, heartburn, nausea and vomiting.  Musculoskeletal:  Negative for back pain, joint pain and myalgias.  Skin: Negative.   Neurological:  Negative for dizziness.  Psychiatric/Behavioral:  Positive for depression. Negative for suicidal ideas. The patient is not nervous/anxious.    Physical Exam Constitutional:      General: She is not in acute distress.    Appearance: She is cachectic. She is not diaphoretic.  Neck:     Thyroid: No thyromegaly.  Cardiovascular:     Rate and Rhythm: Normal rate and regular rhythm.     Pulses: Normal pulses.     Heart sounds: Normal heart sounds.  Pulmonary:     Effort: Pulmonary effort is  normal. No respiratory distress.     Breath sounds: Normal breath sounds.  Abdominal:     General: Bowel sounds are normal. There is no distension.     Palpations: Abdomen is soft.     Tenderness: There is no abdominal tenderness.  Musculoskeletal:        General: Normal range of motion.     Cervical back: Neck supple.     Right lower leg: No edema.     Left lower leg: No edema.  Lymphadenopathy:     Cervical: No cervical adenopathy.  Skin:    General: Skin is warm and dry.  Neurological:     Mental Status: She is alert and oriented to person, place, and time.  Psychiatric:        Mood and Affect: Mood normal.        ASSESSMENT/ PLAN:  TODAY  Oropharyngeal dysphagia Anorexia Major depression recurrent  Will increase zoloft to 100 mg through 04-29-22 on 04-30-22 will begin 200 mg daily. Will need to consider adjunct therapy if this regimen does not improve her mood state Will begin a full liquid diet on her trays   Ok Edwards NP [facility]  call [phone]

## 2022-04-24 ENCOUNTER — Ambulatory Visit (HOSPITAL_COMMUNITY): Admission: RE | Admit: 2022-04-24 | Payer: Medicare Other | Source: Ambulatory Visit

## 2022-04-24 ENCOUNTER — Non-Acute Institutional Stay (SKILLED_NURSING_FACILITY): Payer: Medicare Other | Admitting: Adult Health

## 2022-04-24 ENCOUNTER — Encounter: Payer: Self-pay | Admitting: Adult Health

## 2022-04-24 DIAGNOSIS — F339 Major depressive disorder, recurrent, unspecified: Secondary | ICD-10-CM

## 2022-04-24 DIAGNOSIS — R634 Abnormal weight loss: Secondary | ICD-10-CM | POA: Diagnosis not present

## 2022-04-24 DIAGNOSIS — R630 Anorexia: Secondary | ICD-10-CM | POA: Diagnosis not present

## 2022-04-24 NOTE — Progress Notes (Unsigned)
Location:  [place] Room Number: 938 Place of Service:  SNF (31) Provider: Ok Edwards, NP   CODE STATUS: DNR  No Known Allergies  Chief Complaint  Patient presents with   Acute Visit    Staff concerns    HPI:  She continues to have a poor appetite. She is not eating her full liquid trays. Her family is bringing in food; however; she is just picking at the food. She is spending nearly all of her time in bed with the room dark. She is taking zoloft 50 mg daily for her depression, this dose does not appear to be effective. Her weight is stable 111 pounds.   Past Medical History:  Diagnosis Date   Arthritis    Rheumatoid   Dysphagia    post CVA   Hypertension    Lupus (systemic lupus erythematosus) ([place])    Seizure ([place])    Stroke [facility])     Past Surgical History:  Procedure Laterality Date   BIOPSY  02/21/2022   Procedure: BIOPSY;  Surgeon: [name], DO;  [facility];  Service: Endoscopy;;   CESAREAN SECTION     COLONOSCOPY WITH PROPOFOL N/A 02/21/2022   Procedure: COLONOSCOPY WITH PROPOFOL;  Surgeon: [name], DO;  [facility];  Service: Endoscopy;  Laterality: N/A;  2:00pm   ESOPHAGEAL BRUSHING  02/21/2022   Procedure: ESOPHAGEAL BRUSHING;  Surgeon: [name], DO;  [facility];  Service: Endoscopy;;   ESOPHAGOGASTRODUODENOSCOPY (EGD) WITH PROPOFOL N/A 02/21/2022   Procedure: ESOPHAGOGASTRODUODENOSCOPY (EGD) WITH PROPOFOL;  Surgeon: [name], DO;  [facility];  Service: Endoscopy;  Laterality: N/A;   POLYPECTOMY  02/21/2022   Procedure: POLYPECTOMY;  Surgeon: [name], DO;  [facility];  Service: Endoscopy;;    Social History   Socioeconomic History   Marital status: Single    Spouse name: Not on file   Number of children: Not on file   Years of education: Not on file   Highest education level: Not on file  Occupational History   Not on file   Tobacco Use   Smoking status: Never   Smokeless tobacco: Never  Vaping Use   Vaping Use: Never used  Substance and Sexual Activity   Alcohol use: No    Alcohol/week: 0.0 standard drinks of alcohol   Drug use: No   Sexual activity: Not on file  Other Topics Concern   Not on file  Social History Narrative   Lives with sister.     Social Determinants of Health   Financial Resource Strain: Not on file  Food Insecurity: Not on file  Transportation Needs: Not on file  Physical Activity: Not on file  Stress: Not on file  Social Connections: Not on file  Intimate Partner Violence: Not on file   Family History  Problem Relation Age of Onset   Diabetes Mother    Hypertension Mother    Cancer Mother    Stroke Mother    Colon cancer Father    Hypertension Sister    Hypertension Sister    Hypertension Sister    Hypertension Sister    Hypertension Sister    Hypertension Sister    Hypertension Brother    Hypertension Brother       VITAL SIGNS BP (!) 113/52   Pulse 66   Temp 98 F (36.7 C)   Resp 16   Ht '5\' 6"'$  (1.676 m)  Wt 111 lb 3.2 oz (50.4 kg)   SpO2 97%   BMI 17.95 kg/m   Outpatient Encounter Medications as of 04/24/2022  Medication Sig   acetaminophen (TYLENOL) 325 MG tablet Take 650 mg by mouth every 6 (six) hours as needed.   Amino Acids-Protein Hydrolys (FEEDING SUPPLEMENT, PRO-STAT SUGAR FREE 64,) LIQD 15-100 gram-kcal/30 mL Special Instructions: 03-26-22: protein 6.5 albumin 2.1 With Meals   aspirin 81 MG chewable tablet Chew 81 mg by mouth daily.   atenolol (TENORMIN) 25 MG tablet Take 12.5 mg by mouth daily.   Balsam [place]-Castor Oil (VENELEX) OINT Apply to sacrum, bilateral buttocks and coccyx three times daily.   divalproex (DEPAKOTE) 500 MG DR tablet Take 1 tablet (500 mg total) by mouth 2 (two) times daily.   Ensure Plus (ENSURE PLUS) LIQD Take 237 mLs by mouth 3 (three) times daily between meals.   levETIRAcetam (KEPPRA) 100 MG/ML solution Take 750  mg by mouth 2 (two) times daily.   levothyroxine (SYNTHROID) 75 MCG tablet Take 75 mcg by mouth daily before breakfast.   LORazepam (ATIVAN) 2 MG/ML concentrated solution '1mg'$  as needed for continued seizure activity up to 3 doses max. If still having seizure activity after 3 doses, send to ER.   ondansetron (ZOFRAN-ODT) 8 MG disintegrating tablet Take 8 mg by mouth. Before meals and at bedtime   pantoprazole (PROTONIX) 40 MG tablet Take 40 mg by mouth 2 (two) times daily.   predniSONE (DELTASONE) 20 MG tablet Take 20 mg by mouth daily. for inflammation   sennosides-docusate sodium (SENOKOT-S) 8.6-50 MG tablet Take 1 tablet by mouth 2 (two) times daily.   sertraline (ZOLOFT) 100 MG tablet Take 100 mg by mouth daily.   ticagrelor (BRILINTA) 90 MG TABS tablet Take 90 mg by mouth 2 (two) times daily.   UNABLE TO FIND Diet: Regular diet with thin liquids   No facility-administered encounter medications on file as of 04/24/2022.     SIGNIFICANT DIAGNOSTIC EXAMS  PREVIOUS   04-09-22: right humerus x-ray:No acute fracture or dislocation of the humerus or forearm.   04-09-22: pelvic x-ray  No evidence of acute hip fracture. Moderate bilateral hip osteoarthritis.  04-09-22: ct of head:  1.  No acute intracranial process. 2.  Left parietal vertex scalp hematoma.  04-09-22: ct of cervical spine:  No evidence of acute cervical spine fracture or traumatic listhesis.   NO NEW EXAMS    LABS REVIEWED:   12-14-21; wbc 3.4; hgb 8.9; hct 29.2; plt 723; glucose 80; bun 20; creat 1.36; k+ 5.2; na++ 136; ca 8.7  12-18-21: wbc 4.3; hgb 8.7; hct 28.8; plt 710; glucose 83; bun 20; creat 1.36;  k+ 4.1; na++ 141; ca 8.8  01-10-22: wbc 4.5; hgb 8.0; hct 25.8; mcv 82.2 plt 603; glucose 76; bun 19; creat 1.28; k+ 3.6; na++ 1140; ca 8.5; gfr 46; protein 7.0; albumin 2.3; chol 98; ldl 48; trig 151; hdl 20; tsh 2.976 free t4: 1.00; iron 23; tibc 135 02-18-22: hgb 7.7; hct 24.7; glucose 47; bun 9; creat 0.80; k+ 3.0; na++  136; ca 7.6; gfr>60; protein 5.8; albumin 1.9 02-19-22: wbc 3.2; hgb 8.3; hct 26.0; mcv 82.5 plt 141; k+ 2.6  02-20-22: k+ 4.7  03-05-22: keppra level 42.0 (10-40 normal) 03-26-22: wbc 4.2; hgb 8.2; hct 26.3; mcv 86.8 plt 508; glucose 78; bun 9; creat 0.84; k+ 2.4; na++ 136; ca 7.6; gfr>60; protein 6.5; albumin 2.1; tsh 4.826; sed rate: 82 ANA+ 03-28-22: k+ 4.0 03-31-22: RPR nr; HIV nr 04-09-22:  wbc 6.3; hgb 9.2 hct 30.1 mcv 91.8 plt 440; glucose 101; bun 12; creat 1.17; k + 3.5; na++ 134; ca 8.2; gfr 51  NO NEW LABS.    Review of Systems  Constitutional:  Negative for malaise/fatigue.       No appetite   Respiratory:  Negative for cough and shortness of breath.   Cardiovascular:  Negative for chest pain, palpitations and leg swelling.  Gastrointestinal:  Negative for abdominal pain, constipation and heartburn.  Musculoskeletal:  Negative for back pain, joint pain and myalgias.  Skin: Negative.   Neurological:  Negative for dizziness.  Psychiatric/Behavioral:  The patient is not nervous/anxious.     Physical Exam Constitutional:      General: She is not in acute distress.    Appearance: She is cachectic. She is not diaphoretic.  Neck:     Thyroid: No thyromegaly.  Cardiovascular:     Rate and Rhythm: Normal rate and regular rhythm.     Pulses: Normal pulses.     Heart sounds: Normal heart sounds.  Pulmonary:     Effort: Pulmonary effort is normal. No respiratory distress.     Breath sounds: Normal breath sounds.  Abdominal:     General: Bowel sounds are normal. There is no distension.     Palpations: Abdomen is soft.     Tenderness: There is no abdominal tenderness.  Musculoskeletal:        General: Normal range of motion.     Cervical back: Neck supple.     Right lower leg: No edema.     Left lower leg: No edema.  Lymphadenopathy:     Cervical: No cervical adenopathy.  Skin:    General: Skin is warm and dry.  Neurological:     Mental Status: She is alert. Mental status is at  baseline.     Comments: Does have periods of confusion. Today she thinks that she is going to work. And will eat at work.   Psychiatric:        Mood and Affect: Mood normal.       ASSESSMENT/ PLAN:  TODAY  Weight loss Anorexia Major depression recurrent chronic  Will increase zoloft to 100 mg through 04-30-22 then will begin 200 mg daily; may need to add adjunct therapy  Will check cmp; depakote; keppra; tsh free t4; vitamin B 12; folate;    Ok Edwards NP [facility]  call [phone]

## 2022-04-25 ENCOUNTER — Non-Acute Institutional Stay (SKILLED_NURSING_FACILITY): Payer: Medicare Other | Admitting: Adult Health

## 2022-04-25 ENCOUNTER — Encounter: Payer: Self-pay | Admitting: Adult Health

## 2022-04-25 ENCOUNTER — Encounter (HOSPITAL_COMMUNITY)
Admission: RE | Admit: 2022-04-25 | Discharge: 2022-04-25 | Disposition: A | Discharge status: Home / Self Care | Payer: Medicare Other | Source: Skilled Nursing Facility | Attending: Adult Health | Admitting: Adult Health

## 2022-04-25 DIAGNOSIS — F339 Major depressive disorder, recurrent, unspecified: Secondary | ICD-10-CM

## 2022-04-25 DIAGNOSIS — R7989 Other specified abnormal findings of blood chemistry: Secondary | ICD-10-CM | POA: Insufficient documentation

## 2022-04-25 DIAGNOSIS — E876 Hypokalemia: Secondary | ICD-10-CM

## 2022-04-25 DIAGNOSIS — E43 Unspecified severe protein-calorie malnutrition: Secondary | ICD-10-CM | POA: Insufficient documentation

## 2022-04-25 DIAGNOSIS — R569 Unspecified convulsions: Secondary | ICD-10-CM | POA: Diagnosis not present

## 2022-04-25 DIAGNOSIS — E039 Hypothyroidism, unspecified: Secondary | ICD-10-CM

## 2022-04-25 LAB — COMPREHENSIVE METABOLIC PANEL
ALT: 7 U/L (ref 0–44)
AST: 9 U/L — ABNORMAL LOW (ref 15–41)
Albumin: 2.4 g/dL — ABNORMAL LOW (ref 3.5–5.0)
Alkaline Phosphatase: 48 U/L (ref 38–126)
Anion gap: 6 (ref 5–15)
BUN: 12 mg/dL (ref 8–23)
CO2: 29 mmol/L (ref 22–32)
Calcium: 8 mg/dL — ABNORMAL LOW (ref 8.9–10.3)
Chloride: 107 mmol/L (ref 98–111)
Creatinine, Ser: 0.92 mg/dL (ref 0.44–1.00)
GFR, Estimated: >60 mL/min (ref >60)
Glucose, Bld: 75 mg/dL (ref 70–99)
Potassium: 2.6 mmol/L — CL (ref 3.5–5.1)
Sodium: 142 mmol/L (ref 135–145)
Total Bilirubin: 0.4 mg/dL (ref 0.3–1.2)
Total Protein: 5.4 g/dL — ABNORMAL LOW (ref 6.5–8.1)

## 2022-04-25 LAB — IRON AND TIBC
Iron: 33 ug/dL (ref 28–170)
Saturation Ratios: 26 % (ref 10.4–31.8)
TIBC: 127 ug/dL — ABNORMAL LOW (ref 250–450)
UIBC: 94 ug/dL

## 2022-04-25 LAB — VITAMIN B12: Vitamin B-12: 421 pg/mL (ref 180–914)

## 2022-04-25 LAB — VALPROIC ACID LEVEL: Valproic Acid Lvl: 86 ug/mL (ref 50.0–100.0)

## 2022-04-25 LAB — T4, FREE: Free T4: 1.01 ng/dL (ref 0.61–1.12)

## 2022-04-25 LAB — TSH: TSH: 0.243 u[IU]/mL — ABNORMAL LOW (ref 0.350–4.500)

## 2022-04-25 LAB — FOLATE: Folate: 3.2 ng/mL — ABNORMAL LOW (ref >5.9)

## 2022-04-25 NOTE — Progress Notes (Signed)
Location:  [place] Room Number: 157 Place of Service:  SNF (31)   CODE STATUS: dnr  No Known Allergies  Chief Complaint  Patient presents with   Acute Visit    Patient status and follow up labs.     HPI:  Her lab work has returned; demonstrating her malnutrition and poor po intake. She has hypokalemia; low tsh. She continues with a very poor po intake. She is not eating her meals and eats very little of food brought in by family. She does deny and pain; she denies and nausea or vomiting at this time   Past Medical History:  Diagnosis Date   Arthritis    Rheumatoid   Dysphagia    post CVA   Hypertension    Lupus (systemic lupus erythematosus) ([place])    Seizure ([place])    Stroke [facility])     Past Surgical History:  Procedure Laterality Date   BIOPSY  02/21/2022   Procedure: BIOPSY;  Surgeon: [name], DO;  [facility];  Service: Endoscopy;;   CESAREAN SECTION     COLONOSCOPY WITH PROPOFOL N/A 02/21/2022   Procedure: COLONOSCOPY WITH PROPOFOL;  Surgeon: [name], DO;  [facility];  Service: Endoscopy;  Laterality: N/A;  2:00pm   ESOPHAGEAL BRUSHING  02/21/2022   Procedure: ESOPHAGEAL BRUSHING;  Surgeon: [name], DO;  [facility];  Service: Endoscopy;;   ESOPHAGOGASTRODUODENOSCOPY (EGD) WITH PROPOFOL N/A 02/21/2022   Procedure: ESOPHAGOGASTRODUODENOSCOPY (EGD) WITH PROPOFOL;  Surgeon: [name], DO;  [facility];  Service: Endoscopy;  Laterality: N/A;   POLYPECTOMY  02/21/2022   Procedure: POLYPECTOMY;  Surgeon: [name], DO;  [facility];  Service: Endoscopy;;    Social History   Socioeconomic History   Marital status: Single    Spouse name: Not on file   Number of children: Not on file   Years of education: Not on file   Highest education level: Not on file  Occupational History   Not on file  Tobacco Use   Smoking status: Never   Smokeless  tobacco: Never  Vaping Use   Vaping Use: Never used  Substance and Sexual Activity   Alcohol use: No    Alcohol/week: 0.0 standard drinks of alcohol   Drug use: No   Sexual activity: Not on file  Other Topics Concern   Not on file  Social History Narrative   Lives with sister.     Social Determinants of Health   Financial Resource Strain: Not on file  Food Insecurity: Not on file  Transportation Needs: Not on file  Physical Activity: Not on file  Stress: Not on file  Social Connections: Not on file  Intimate Partner Violence: Not on file   Family History  Problem Relation Age of Onset   Diabetes Mother    Hypertension Mother    Cancer Mother    Stroke Mother    Colon cancer Father    Hypertension Sister    Hypertension Sister    Hypertension Sister    Hypertension Sister    Hypertension Sister    Hypertension Sister    Hypertension Brother    Hypertension Brother       VITAL SIGNS BP 110/60   Pulse 72   Temp 98 F (36.7 C)   Resp 18   Ht '5\' 5"'$  (1.651 m)   Wt 111 lb 3.2 oz (50.4 kg)   SpO2  97%   BMI 18.50 kg/m   Outpatient Encounter Medications as of 04/25/2022  Medication Sig   acetaminophen (TYLENOL) 325 MG tablet Take 650 mg by mouth every 6 (six) hours as needed.   Amino Acids-Protein Hydrolys (FEEDING SUPPLEMENT, PRO-STAT SUGAR FREE 64,) LIQD 15-100 gram-kcal/30 mL Special Instructions: 03-26-22: protein 6.5 albumin 2.1 With Meals   aspirin 81 MG chewable tablet Chew 81 mg by mouth daily.   atenolol (TENORMIN) 25 MG tablet Take 12.5 mg by mouth daily.   Balsam [place]-Castor Oil (VENELEX) OINT Apply to sacrum, bilateral buttocks and coccyx three times daily.   divalproex (DEPAKOTE) 500 MG DR tablet Take 1 tablet (500 mg total) by mouth 2 (two) times daily.   Ensure Plus (ENSURE PLUS) LIQD Take 237 mLs by mouth 3 (three) times daily between meals.   levETIRAcetam (KEPPRA) 100 MG/ML solution Take 750 mg by mouth 2 (two) times daily.   levothyroxine  (SYNTHROID) 75 MCG tablet Take 75 mcg by mouth daily before breakfast.   LORazepam (ATIVAN) 2 MG/ML concentrated solution '1mg'$  as needed for continued seizure activity up to 3 doses max. If still having seizure activity after 3 doses, send to ER.   ondansetron (ZOFRAN-ODT) 8 MG disintegrating tablet Take 8 mg by mouth. Before meals and at bedtime   pantoprazole (PROTONIX) 40 MG tablet Take 40 mg by mouth 2 (two) times daily.   predniSONE (DELTASONE) 20 MG tablet Take 20 mg by mouth daily. for inflammation   sennosides-docusate sodium (SENOKOT-S) 8.6-50 MG tablet Take 1 tablet by mouth 2 (two) times daily.   sertraline (ZOLOFT) 100 MG tablet Take 100 mg by mouth daily.   ticagrelor (BRILINTA) 90 MG TABS tablet Take 90 mg by mouth 2 (two) times daily.   UNABLE TO FIND Diet: Regular diet with thin liquids   No facility-administered encounter medications on file as of 04/25/2022.     SIGNIFICANT DIAGNOSTIC EXAMS   PREVIOUS   04-09-22: right humerus x-ray:No acute fracture or dislocation of the humerus or forearm.   04-09-22: pelvic x-ray  No evidence of acute hip fracture. Moderate bilateral hip osteoarthritis.  04-09-22: ct of head:  1.  No acute intracranial process. 2.  Left parietal vertex scalp hematoma.  04-09-22: ct of cervical spine:  No evidence of acute cervical spine fracture or traumatic listhesis.   NO NEW EXAMS    LABS REVIEWED:   12-14-21; wbc 3.4; hgb 8.9; hct 29.2; plt 723; glucose 80; bun 20; creat 1.36; k+ 5.2; na++ 136; ca 8.7  12-18-21: wbc 4.3; hgb 8.7; hct 28.8; plt 710; glucose 83; bun 20; creat 1.36;  k+ 4.1; na++ 141; ca 8.8  01-10-22: wbc 4.5; hgb 8.0; hct 25.8; mcv 82.2 plt 603; glucose 76; bun 19; creat 1.28; k+ 3.6; na++ 1140; ca 8.5; gfr 46; protein 7.0; albumin 2.3; chol 98; ldl 48; trig 151; hdl 20; tsh 2.976 free t4: 1.00; iron 23; tibc 135 02-18-22: hgb 7.7; hct 24.7; glucose 47; bun 9; creat 0.80; k+ 3.0; na++ 136; ca 7.6; gfr>60; protein 5.8; albumin  1.9 02-19-22: wbc 3.2; hgb 8.3; hct 26.0; mcv 82.5 plt 141; k+ 2.6  02-20-22: k+ 4.7  03-05-22: keppra level 42.0 (10-40 normal) 03-26-22: wbc 4.2; hgb 8.2; hct 26.3; mcv 86.8 plt 508; glucose 78; bun 9; creat 0.84; k+ 2.4; na++ 136; ca 7.6; gfr>60; protein 6.5; albumin 2.1; tsh 4.826; sed rate: 82 ANA+ 03-28-22: k+ 4.0 03-31-22: RPR nr; HIV nr 04-09-22: wbc 6.3; hgb 9.2 hct 30.1 mcv 91.8 plt  440; glucose 101; bun 12; creat 1.17; k + 3.5; na++ 134; ca 8.2; gfr 51  TODAY  04-25-22: glucose 75; bun 12; creat 0.92; k+ 2.6; na++ 142; ca 8.0; gfr >60;  protein 5.4; albumin 2.4. depakote 86; vitamin B 12: 421; folate 3.2; iron 33; tibc 127.   Review of Systems  Constitutional:  Negative for malaise/fatigue.       Poor appetite   Respiratory:  Negative for cough and shortness of breath.   Cardiovascular:  Negative for chest pain, palpitations and leg swelling.  Gastrointestinal:  Negative for abdominal pain, constipation and heartburn.  Musculoskeletal:  Negative for back pain, joint pain and myalgias.  Skin: Negative.   Neurological:  Negative for dizziness.  Psychiatric/Behavioral:  The patient is not nervous/anxious.    Physical Exam Constitutional:      General: She is not in acute distress.    Appearance: She is well-developed. She is cachectic. She is not diaphoretic.  Neck:     Thyroid: No thyromegaly.  Cardiovascular:     Rate and Rhythm: Normal rate and regular rhythm.     Pulses: Normal pulses.     Heart sounds: Normal heart sounds.  Pulmonary:     Effort: Pulmonary effort is normal. No respiratory distress.     Breath sounds: Normal breath sounds.  Abdominal:     General: Bowel sounds are normal. There is no distension.     Palpations: Abdomen is soft.     Tenderness: There is no abdominal tenderness.  Musculoskeletal:        General: Normal range of motion.     Cervical back: Neck supple.     Right lower leg: No edema.     Left lower leg: No edema.  Lymphadenopathy:      Cervical: No cervical adenopathy.  Skin:    General: Skin is warm and dry.  Neurological:     Mental Status: She is alert. Mental status is at baseline.  Psychiatric:        Mood and Affect: Mood normal.       ASSESSMENT/ PLAN:  TODAY  Protein calorie malnutrition severe Hypokalemia Hypothyroidism unspecified type   Will give k+ 40 meq four times today and will repeat in the AM On synthroid 75 mcg daily will reduce to 50 mcg daily will check on 42-7-06 Will begin folic 1 mg daily  Will setup palliative care consult.    Ok Edwards NP [facility]  call [phone]

## 2022-04-26 ENCOUNTER — Encounter: Payer: Self-pay | Admitting: Adult Health

## 2022-04-26 ENCOUNTER — Encounter (HOSPITAL_COMMUNITY)
Admission: RE | Admit: 2022-04-26 | Discharge: 2022-04-26 | Disposition: A | Discharge status: Home / Self Care | Payer: Medicare Other | Source: Skilled Nursing Facility | Attending: Adult Health | Admitting: Adult Health

## 2022-04-26 ENCOUNTER — Non-Acute Institutional Stay (SKILLED_NURSING_FACILITY): Payer: Medicare Other | Admitting: Adult Health

## 2022-04-26 ENCOUNTER — Other Ambulatory Visit: Payer: Self-pay | Admitting: Adult Health

## 2022-04-26 DIAGNOSIS — Z8673 Personal history of transient ischemic attack (TIA), and cerebral infarction without residual deficits: Secondary | ICD-10-CM

## 2022-04-26 DIAGNOSIS — R627 Adult failure to thrive: Secondary | ICD-10-CM

## 2022-04-26 DIAGNOSIS — E43 Unspecified severe protein-calorie malnutrition: Secondary | ICD-10-CM

## 2022-04-26 DIAGNOSIS — R569 Unspecified convulsions: Secondary | ICD-10-CM | POA: Diagnosis not present

## 2022-04-26 DIAGNOSIS — R7989 Other specified abnormal findings of blood chemistry: Secondary | ICD-10-CM | POA: Diagnosis not present

## 2022-04-26 LAB — POTASSIUM: Potassium: 4.4 mmol/L (ref 3.5–5.1)

## 2022-04-26 LAB — LEVETIRACETAM LEVEL: Levetiracetam Lvl: 43.8 ug/mL — ABNORMAL HIGH (ref 10.0–40.0)

## 2022-04-26 MED ORDER — MORPHINE SULFATE (CONCENTRATE) 20 MG/ML PO SOLN: 5.0000 mg | Freq: Four times a day (QID) | ORAL | 0 refills | Status: DC | PRN

## 2022-04-26 NOTE — Progress Notes (Signed)
Location:  [place] Room Number: 694 HWTUU of Service:  SNF (31)   CODE STATUS: DNR  No Known Allergies  Chief Complaint  Patient presents with   Acute Visit    care plan meeting    HPI:  We have come together for her care plan meeting. Family present,  BIMS 8/15 mood 7/30: decreased appetite; decreased energy; nervous; depression. She is nonambulatory with 2 falls without injury. She requires extensive assist to dependent with her adls. She is occasionally incontinent of bladder and bowel. Dietary: her august  weight is 111; July 122 pounds admitting was 140 pounds. Her diet is regular she does feed herself; will feed herself. Therapy: none at this time. Activities: stays in room . She continues to be followed for her chronic illnesses including:  Failure to thrive in adult  Protein calorie malnutrition severe   History of cva .  We have discussed her advanced directives: she has told her family that she desires for comfort care with hospice to follow her. She has discussed this with her family. They are in agreement with her plan.   Past Medical History:  Diagnosis Date   Arthritis    Rheumatoid   Dysphagia    post CVA   Hypertension    Lupus (systemic lupus erythematosus) ([place])    Seizure ([place])    Stroke [facility])     Past Surgical History:  Procedure Laterality Date   BIOPSY  02/21/2022   Procedure: BIOPSY;  Surgeon: [name], DO;  Location: [facility]O [facility];  Service: Endoscopy;;   CESAREAN SECTION     COLONOSCOPY WITH PROPOFOL N/A 02/21/2022   Procedure: COLONOSCOPY WITH PROPOFOL;  Surgeon: [name], DO;  Location: [facility];  Service: Endoscopy;  Laterality: N/A;  2:00pm   ESOPHAGEAL BRUSHING  02/21/2022   Procedure: ESOPHAGEAL BRUSHING;  Surgeon: [name], DO;  Location: [facility];  Service: Endoscopy;;   ESOPHAGOGASTRODUODENOSCOPY (EGD) WITH PROPOFOL N/A 02/21/2022   Procedure: ESOPHAGOGASTRODUODENOSCOPY (EGD) WITH  PROPOFOL;  Surgeon: [name], DO;  [facility];  Service: Endoscopy;  Laterality: N/A;   POLYPECTOMY  02/21/2022   Procedure: POLYPECTOMY;  Surgeon: [name], DO;  Location: [facility];  Service: Endoscopy;;    Social History   Socioeconomic History   Marital status: Single    Spouse name: Not on file   Number of children: Not on file   Years of education: Not on file   Highest education level: Not on file  Occupational History   Not on file  Tobacco Use   Smoking status: Never   Smokeless tobacco: Never  Vaping Use   Vaping Use: Never used  Substance and Sexual Activity   Alcohol use: No    Alcohol/week: 0.0 standard drinks of alcohol   Drug use: No   Sexual activity: Not on file  Other Topics Concern   Not on file  Social History Narrative   Lives with sister.     Social Determinants of Health   Financial Resource Strain: Not on file  Food Insecurity: Not on file  Transportation Needs: Not on file  Physical Activity: Not on file  Stress: Not on file  Social Connections: Not on file  Intimate Partner Violence: Not on file   Family History  Problem Relation Age of Onset   Diabetes Mother    Hypertension Mother    Cancer Mother    Stroke Mother    Colon  cancer Father    Hypertension Sister    Hypertension Sister    Hypertension Sister    Hypertension Sister    Hypertension Sister    Hypertension Sister    Hypertension Brother    Hypertension Brother       VITAL SIGNS BP (!) 113/52   Pulse 66   Temp 98 F (36.7 C)   Resp 16   Ht '5\' 6"'$  (1.676 m)   Wt 111 lb 3.2 oz (50.4 kg)   SpO2 97%   BMI 17.95 kg/m   Outpatient Encounter Medications as of 04/26/2022  Medication Sig   acetaminophen (TYLENOL) 325 MG tablet Take 650 mg by mouth every 6 (six) hours as needed.   Amino Acids-Protein Hydrolys (FEEDING SUPPLEMENT, PRO-STAT SUGAR FREE 64,) LIQD 15-100 gram-kcal/30 mL Special Instructions: 03-26-22: protein 6.5 albumin  2.1 With Meals   aspirin 81 MG chewable tablet Chew 81 mg by mouth daily.   atenolol (TENORMIN) 25 MG tablet Take 12.5 mg by mouth daily.   Balsam [place]-Castor Oil (VENELEX) OINT Apply to sacrum, bilateral buttocks and coccyx three times daily.   divalproex (DEPAKOTE) 500 MG DR tablet Take 1 tablet (500 mg total) by mouth 2 (two) times daily.   Ensure Plus (ENSURE PLUS) LIQD Take 237 mLs by mouth 3 (three) times daily between meals.   folic acid (FOLVITE) 1 MG tablet Take 1 mg by mouth daily.   levETIRAcetam (KEPPRA) 100 MG/ML solution Take 750 mg by mouth 2 (two) times daily.   levothyroxine (SYNTHROID) 75 MCG tablet Take 75 mcg by mouth daily before breakfast.   LORazepam (ATIVAN) 2 MG/ML concentrated solution '1mg'$  as needed for continued seizure activity up to 3 doses max. If still having seizure activity after 3 doses, send to ER.   ondansetron (ZOFRAN-ODT) 8 MG disintegrating tablet Take 8 mg by mouth. Before meals and at bedtime   pantoprazole (PROTONIX) 40 MG tablet Take 40 mg by mouth 2 (two) times daily.   predniSONE (DELTASONE) 20 MG tablet Take 20 mg by mouth daily. for inflammation   sennosides-docusate sodium (SENOKOT-S) 8.6-50 MG tablet Take 1 tablet by mouth 2 (two) times daily.   sertraline (ZOLOFT) 100 MG tablet Take 200 mg by mouth daily.   sertraline (ZOLOFT) 100 MG tablet Take 100 mg by mouth daily.   ticagrelor (BRILINTA) 90 MG TABS tablet Take 90 mg by mouth 2 (two) times daily.   UNABLE TO FIND Diet: Regular diet with thin liquids   No facility-administered encounter medications on file as of 04/26/2022.     SIGNIFICANT DIAGNOSTIC EXAMS   PREVIOUS   04-09-22: right humerus x-ray:No acute fracture or dislocation of the humerus or forearm.   04-09-22: pelvic x-ray  No evidence of acute hip fracture. Moderate bilateral hip osteoarthritis.  04-09-22: ct of head:  1.  No acute intracranial process. 2.  Left parietal vertex scalp hematoma.  04-09-22: ct of cervical  spine:  No evidence of acute cervical spine fracture or traumatic listhesis.   NO NEW EXAMS    LABS REVIEWED:   12-14-21; wbc 3.4; hgb 8.9; hct 29.2; plt 723; glucose 80; bun 20; creat 1.36; k+ 5.2; na++ 136; ca 8.7  12-18-21: wbc 4.3; hgb 8.7; hct 28.8; plt 710; glucose 83; bun 20; creat 1.36;  k+ 4.1; na++ 141; ca 8.8  01-10-22: wbc 4.5; hgb 8.0; hct 25.8; mcv 82.2 plt 603; glucose 76; bun 19; creat 1.28; k+ 3.6; na++ 1140; ca 8.5; gfr 46; protein 7.0; albumin 2.3; chol  98; ldl 48; trig 151; hdl 20; tsh 2.976 free t4: 1.00; iron 23; tibc 135 02-18-22: hgb 7.7; hct 24.7; glucose 47; bun 9; creat 0.80; k+ 3.0; na++ 136; ca 7.6; gfr>60; protein 5.8; albumin 1.9 02-19-22: wbc 3.2; hgb 8.3; hct 26.0; mcv 82.5 plt 141; k+ 2.6  02-20-22: k+ 4.7  03-05-22: keppra level 42.0 (10-40 normal) 03-26-22: wbc 4.2; hgb 8.2; hct 26.3; mcv 86.8 plt 508; glucose 78; bun 9; creat 0.84; k+ 2.4; na++ 136; ca 7.6; gfr>60; protein 6.5; albumin 2.1; tsh 4.826; sed rate: 82 ANA+ 03-28-22: k+ 4.0 03-31-22: RPR nr; HIV nr 04-09-22: wbc 6.3; hgb 9.2 hct 30.1 mcv 91.8 plt 440; glucose 101; bun 12; creat 1.17; k + 3.5; na++ 134; ca 8.2; gfr 51  TODAY  04-25-22: glucose 75; bun 12; creat 0.92; k+ 2.6; na++ 142; ca 8.0; gfr >60;  protein 5.4; albumin 2.4. depakote 86; vitamin B 12: 421; folate 3.2; iron 33; tibc 127. 04-26-22: k+ 4.4   Review of Systems  Constitutional:  Negative for malaise/fatigue.       Poor appetite   Respiratory:  Negative for cough and shortness of breath.   Cardiovascular:  Negative for chest pain, palpitations and leg swelling.  Gastrointestinal:  Negative for abdominal pain, constipation and heartburn.  Musculoskeletal:  Negative for back pain, joint pain and myalgias.  Skin: Negative.   Neurological:  Negative for dizziness.  Psychiatric/Behavioral:  The patient is not nervous/anxious.    Physical Exam Constitutional:      General: She is not in acute distress.    Appearance: She is cachectic. She  is not diaphoretic.  Neck:     Thyroid: No thyromegaly.  Cardiovascular:     Rate and Rhythm: Normal rate and regular rhythm.     Pulses: Normal pulses.     Heart sounds: Normal heart sounds.  Pulmonary:     Effort: Pulmonary effort is normal. No respiratory distress.     Breath sounds: Normal breath sounds.  Abdominal:     General: Bowel sounds are normal. There is no distension.     Palpations: Abdomen is soft.     Tenderness: There is no abdominal tenderness.  Musculoskeletal:        General: Normal range of motion.     Cervical back: Neck supple.     Right lower leg: No edema.     Left lower leg: No edema.  Lymphadenopathy:     Cervical: No cervical adenopathy.  Skin:    General: Skin is warm and dry.  Neurological:     Mental Status: She is alert. Mental status is at baseline.  Psychiatric:        Mood and Affect: Mood normal.       ASSESSMENT/ PLAN:  TODAY  Failure to thrive in adult Protein calorie malnutrition severe History of cva   Will stop the following medications.: asa; atenolol; brillinta; folic acid; synthroid; prednisone; prostat; zoloft; protonix;  Will change zofran to every 6 hours as needed Will begin roxanol 5 mg every 6 hours as needed   Time spent with patient and family 50 minutes: 30 minutes spent with advanced directives new MOST form has been filled out.    Ok Edwards NP [facility]   call [phone]

## 2022-04-29 ENCOUNTER — Other Ambulatory Visit: Payer: Self-pay | Admitting: Adult Health

## 2022-04-29 DIAGNOSIS — D649 Anemia, unspecified: Secondary | ICD-10-CM | POA: Diagnosis not present

## 2022-04-29 DIAGNOSIS — E039 Hypothyroidism, unspecified: Secondary | ICD-10-CM | POA: Diagnosis not present

## 2022-04-29 DIAGNOSIS — I10 Essential (primary) hypertension: Secondary | ICD-10-CM | POA: Diagnosis not present

## 2022-04-29 DIAGNOSIS — I69398 Other sequelae of cerebral infarction: Secondary | ICD-10-CM | POA: Diagnosis not present

## 2022-04-30 ENCOUNTER — Other Ambulatory Visit: Payer: Medicare Other | Admitting: *Deleted

## 2022-05-08 ENCOUNTER — Encounter: Payer: Self-pay | Admitting: Adult Health

## 2022-05-08 ENCOUNTER — Non-Acute Institutional Stay (SKILLED_NURSING_FACILITY): Payer: Medicare Other | Admitting: Adult Health

## 2022-05-08 DIAGNOSIS — E43 Unspecified severe protein-calorie malnutrition: Secondary | ICD-10-CM | POA: Diagnosis not present

## 2022-05-08 DIAGNOSIS — R627 Adult failure to thrive: Secondary | ICD-10-CM | POA: Diagnosis not present

## 2022-05-08 DIAGNOSIS — Z8673 Personal history of transient ischemic attack (TIA), and cerebral infarction without residual deficits: Secondary | ICD-10-CM

## 2022-05-08 NOTE — Progress Notes (Signed)
Location:  [place] Room Number: 157-W Place of Service:  SNF (31)   CODE STATUS: DNR  No Known Allergies  Chief Complaint  Patient presents with   Medical Management of Chronic Issues                       Failure to thrive in adult   Protein calorie malnutrition severe  History of cva     HPI:  She is a 66 year old long term resident of this facility being seen for the management of her chronic illnesses:  Failure to thrive in adult   Protein calorie malnutrition severe  History of cva . He being followed by hospice care. Her appetite remains very poor. There are no reports of uncontrolled pain.   Past Medical History:  Diagnosis Date   Arthritis    Rheumatoid   Dysphagia    post CVA   Hypertension    Lupus (systemic lupus erythematosus) ([place])    Seizure ([place])    Stroke [facility])     Past Surgical History:  Procedure Laterality Date   BIOPSY  02/21/2022   Procedure: BIOPSY;  Surgeon: [name], DO;  Location: [facility];  Service: Endoscopy;;   CESAREAN SECTION     COLONOSCOPY WITH PROPOFOL N/A 02/21/2022   Procedure: COLONOSCOPY WITH PROPOFOL;  Surgeon: [name], DO;  Location: [facility];  Service: Endoscopy;  Laterality: N/A;  2:00pm   ESOPHAGEAL BRUSHING  02/21/2022   Procedure: ESOPHAGEAL BRUSHING;  Surgeon: [name], DO;  Location: [facility];  Service: Endoscopy;;   ESOPHAGOGASTRODUODENOSCOPY (EGD) WITH PROPOFOL N/A 02/21/2022   Procedure: ESOPHAGOGASTRODUODENOSCOPY (EGD) WITH PROPOFOL;  Surgeon: [name], DO;  Location: [facility];  Service: Endoscopy;  Laterality: N/A;   POLYPECTOMY  02/21/2022   Procedure: POLYPECTOMY;  Surgeon: [name], DO;  Location: [facility];  Service: Endoscopy;;    Social History   Socioeconomic History   Marital status: Single    Spouse name: Not on file   Number of children: Not on file   Years of education: Not on file   Highest education level: Not on  file  Occupational History   Not on file  Tobacco Use   Smoking status: Never   Smokeless tobacco: Never  Vaping Use   Vaping Use: Never used  Substance and Sexual Activity   Alcohol use: No    Alcohol/week: 0.0 standard drinks of alcohol   Drug use: No   Sexual activity: Not on file  Other Topics Concern   Not on file  Social History Narrative   Lives with sister.     Social Determinants of Health   Financial Resource Strain: Not on file  Food Insecurity: Not on file  Transportation Needs: Not on file  Physical Activity: Not on file  Stress: Not on file  Social Connections: Not on file  Intimate Partner Violence: Not on file   Family History  Problem Relation Age of Onset   Diabetes Mother    Hypertension Mother    Cancer Mother    Stroke Mother    Colon cancer Father    Hypertension Sister    Hypertension Sister    Hypertension Sister    Hypertension Sister    Hypertension Sister    Hypertension Sister    Hypertension Brother    Hypertension Brother       VITAL SIGNS BP 110/60   Pulse  68   Temp (!) 97 F (36.1 C)   Resp 20   Ht '5\' 6"'$  (1.676 m)   Wt 111 lb 3.2 oz (50.4 kg)   SpO2 96%   BMI 17.95 kg/m   Outpatient Encounter Medications as of 05/08/2022  Medication Sig   acetaminophen (TYLENOL) 325 MG tablet Take 650 mg by mouth every 6 (six) hours as needed.   Balsam [place]-Castor Oil (VENELEX) OINT Apply to sacrum, bilateral buttocks and coccyx three times daily.   divalproex (DEPAKOTE) 500 MG DR tablet Take 1 tablet (500 mg total) by mouth 2 (two) times daily.   Ensure Plus (ENSURE PLUS) LIQD Take 237 mLs by mouth 3 (three) times daily between meals.   levETIRAcetam (KEPPRA) 100 MG/ML solution Take 750 mg by mouth 2 (two) times daily.   LORazepam (ATIVAN) 2 MG/ML concentrated solution '1mg'$  as needed for continued seizure activity up to 3 doses max. If still having seizure activity after 3 doses, send to ER.   morphine (ROXANOL) 20 MG/ML concentrated  solution Take 0.25 mLs (5 mg total) by mouth every 6 (six) hours as needed for severe pain.   ondansetron (ZOFRAN-ODT) 8 MG disintegrating tablet Take 8 mg by mouth every 6 (six) hours as needed for nausea or vomiting. Before meals and at bedtime   sennosides-docusate sodium (SENOKOT-S) 8.6-50 MG tablet Take 1 tablet by mouth 2 (two) times daily.   UNABLE TO FIND Diet: Regular diet with thin liquids   No facility-administered encounter medications on file as of 05/08/2022.     SIGNIFICANT DIAGNOSTIC EXAMS  PREVIOUS   04-09-22: right humerus x-ray:No acute fracture or dislocation of the humerus or forearm.   04-09-22: pelvic x-ray  No evidence of acute hip fracture. Moderate bilateral hip osteoarthritis.  04-09-22: ct of head:  1.  No acute intracranial process. 2.  Left parietal vertex scalp hematoma.  04-09-22: ct of cervical spine:  No evidence of acute cervical spine fracture or traumatic listhesis.   NO NEW EXAMS    LABS REVIEWED:   12-14-21; wbc 3.4; hgb 8.9; hct 29.2; plt 723; glucose 80; bun 20; creat 1.36; k+ 5.2; na++ 136; ca 8.7  12-18-21: wbc 4.3; hgb 8.7; hct 28.8; plt 710; glucose 83; bun 20; creat 1.36;  k+ 4.1; na++ 141; ca 8.8  01-10-22: wbc 4.5; hgb 8.0; hct 25.8; mcv 82.2 plt 603; glucose 76; bun 19; creat 1.28; k+ 3.6; na++ 1140; ca 8.5; gfr 46; protein 7.0; albumin 2.3; chol 98; ldl 48; trig 151; hdl 20; tsh 2.976 free t4: 1.00; iron 23; tibc 135 02-18-22: hgb 7.7; hct 24.7; glucose 47; bun 9; creat 0.80; k+ 3.0; na++ 136; ca 7.6; gfr>60; protein 5.8; albumin 1.9 02-19-22: wbc 3.2; hgb 8.3; hct 26.0; mcv 82.5 plt 141; k+ 2.6  02-20-22: k+ 4.7  03-05-22: keppra level 42.0 (10-40 normal) 03-26-22: wbc 4.2; hgb 8.2; hct 26.3; mcv 86.8 plt 508; glucose 78; bun 9; creat 0.84; k+ 2.4; na++ 136; ca 7.6; gfr>60; protein 6.5; albumin 2.1; tsh 4.826; sed rate: 82 ANA+ 03-28-22: k+ 4.0 03-31-22: RPR nr; HIV nr 04-09-22: wbc 6.3; hgb 9.2 hct 30.1 mcv 91.8 plt 440; glucose 101; bun 12;  creat 1.17; k + 3.5; na++ 134; ca 8.2; gfr 51  TODAY  04-25-22: glucose 75; bun 12; creat 0.92; k+ 2.6; na++ 142; ca 8.0; gfr >60;  protein 5.4; albumin 2.4. depakote 86; vitamin B 12: 421; folate 3.2; iron 33; tibc 127. 04-26-22: k+ 4.4   Review of Systems  Constitutional:  Positive for malaise/fatigue and weight loss.  Respiratory:  Negative for cough and shortness of breath.   Cardiovascular:  Negative for chest pain, palpitations and leg swelling.  Gastrointestinal:  Negative for abdominal pain, constipation and heartburn.  Musculoskeletal:  Negative for back pain, joint pain and myalgias.  Skin: Negative.   Neurological:  Negative for dizziness.  Psychiatric/Behavioral:  The patient is not nervous/anxious.     Physical Exam Constitutional:      General: She is not in acute distress.    Appearance: She is cachectic. She is not diaphoretic.  Neck:     Thyroid: No thyromegaly.  Cardiovascular:     Rate and Rhythm: Normal rate and regular rhythm.     Pulses: Normal pulses.     Heart sounds: Normal heart sounds.  Pulmonary:     Effort: Pulmonary effort is normal. No respiratory distress.     Breath sounds: Normal breath sounds.  Abdominal:     General: Bowel sounds are normal. There is no distension.     Palpations: Abdomen is soft.     Tenderness: There is no abdominal tenderness.  Musculoskeletal:        General: Normal range of motion.     Cervical back: Neck supple.     Right lower leg: No edema.     Left lower leg: No edema.  Lymphadenopathy:     Cervical: No cervical adenopathy.  Skin:    General: Skin is warm and dry.  Neurological:     Mental Status: She is alert and oriented to person, place, and time.  Psychiatric:        Mood and Affect: Mood normal.       ASSESSMENT/ PLAN:  TODAY  Failure to thrive in adult Protein calorie malnutrition severe History of cva   At this time she is comfortable.  Will continue current plan of care She continues to  be followed by hospice care.    Ok Edwards NP [facility]   call [phone]

## 2022-05-22 NOTE — Telephone Encounter (Signed)
Never received records. Patient on comfort care with Hospice.

## 2022-05-24 ENCOUNTER — Non-Acute Institutional Stay (SKILLED_NURSING_FACILITY): Payer: Medicare Other | Admitting: Adult Health

## 2022-05-24 ENCOUNTER — Encounter: Payer: Self-pay | Admitting: Adult Health

## 2022-05-24 DIAGNOSIS — R569 Unspecified convulsions: Secondary | ICD-10-CM

## 2022-05-24 DIAGNOSIS — M0579 Rheumatoid arthritis with rheumatoid factor of multiple sites without organ or systems involvement: Secondary | ICD-10-CM

## 2022-05-24 DIAGNOSIS — N184 Chronic kidney disease, stage 4 (severe): Secondary | ICD-10-CM

## 2022-05-24 DIAGNOSIS — I69398 Other sequelae of cerebral infarction: Secondary | ICD-10-CM

## 2022-05-24 NOTE — Progress Notes (Signed)
Location:  [place] Room Number: 157-W Place of Service:  SNF (31)   CODE STATUS: DNR  No Known Allergies  Chief Complaint  Patient presents with   Acute Visit    Care plan meeting    HPI:    Past Medical History:  Diagnosis Date   Arthritis    Rheumatoid   Dysphagia    post CVA   Hypertension    Lupus (systemic lupus erythematosus) ([place])    Seizure ([place])    Stroke [facility])     Past Surgical History:  Procedure Laterality Date   BIOPSY  02/21/2022   Procedure: BIOPSY;  Surgeon: [name], DO;  [facility];  Service: Endoscopy;;   CESAREAN SECTION     COLONOSCOPY WITH PROPOFOL N/A 02/21/2022   Procedure: COLONOSCOPY WITH PROPOFOL;  Surgeon: [name], DO;  Location: [facility];  Service: Endoscopy;  Laterality: N/A;  2:00pm   ESOPHAGEAL BRUSHING  02/21/2022   Procedure: ESOPHAGEAL BRUSHING;  Surgeon: [name], DO;  Location: [facility];  Service: Endoscopy;;   ESOPHAGOGASTRODUODENOSCOPY (EGD) WITH PROPOFOL N/A 02/21/2022   Procedure: ESOPHAGOGASTRODUODENOSCOPY (EGD) WITH PROPOFOL;  Surgeon: [name], DO;  Location: [facility];  Service: Endoscopy;  Laterality: N/A;   POLYPECTOMY  02/21/2022   Procedure: POLYPECTOMY;  Surgeon: [name], DO;  Location: [facility];  Service: Endoscopy;;    Social History   Socioeconomic History   Marital status: Single    Spouse name: Not on file   Number of children: Not on file   Years of education: Not on file   Highest education level: Not on file  Occupational History   Not on file  Tobacco Use   Smoking status: Never   Smokeless tobacco: Never  Vaping Use   Vaping Use: Never used  Substance and Sexual Activity   Alcohol use: No    Alcohol/week: 0.0 standard drinks of alcohol   Drug use: No   Sexual activity: Not on file  Other Topics Concern   Not on file  Social History Narrative   Lives with sister.     Social Determinants of  Health   Financial Resource Strain: Not on file  Food Insecurity: Not on file  Transportation Needs: Not on file  Physical Activity: Not on file  Stress: Not on file  Social Connections: Not on file  Intimate Partner Violence: Not on file   Family History  Problem Relation Age of Onset   Diabetes Mother    Hypertension Mother    Cancer Mother    Stroke Mother    Colon cancer Father    Hypertension Sister    Hypertension Sister    Hypertension Sister    Hypertension Sister    Hypertension Sister    Hypertension Sister    Hypertension Brother    Hypertension Brother       VITAL SIGNS BP 130/74   Pulse 66   Temp 97.7 F (36.5 C)   Resp 18   Ht '5\' 6"'$  (1.676 m)   Wt 119 lb 3.2 oz (54.1 kg)   SpO2 100%   BMI 19.24 kg/m   Outpatient Encounter Medications as of 05/24/2022  Medication Sig   acetaminophen (TYLENOL) 325 MG tablet Take 650 mg by mouth every 6 (six) hours as needed.   Balsam [place]-Castor Oil (VENELEX) OINT Apply to sacrum, bilateral buttocks and coccyx three times daily.   divalproex (DEPAKOTE) 500 MG DR tablet  Take 1 tablet (500 mg total) by mouth 2 (two) times daily.   Ensure Plus (ENSURE PLUS) LIQD Take 237 mLs by mouth 3 (three) times daily between meals.   levETIRAcetam (KEPPRA) 100 MG/ML solution Take 750 mg by mouth 2 (two) times daily.   LORazepam (ATIVAN) 2 MG/ML concentrated solution '1mg'$  as needed for continued seizure activity up to 3 doses max. If still having seizure activity after 3 doses, send to ER.   morphine (ROXANOL) 20 MG/ML concentrated solution Take 0.25 mLs (5 mg total) by mouth every 6 (six) hours as needed for severe pain.   ondansetron (ZOFRAN-ODT) 8 MG disintegrating tablet Take 8 mg by mouth every 6 (six) hours as needed for nausea or vomiting. Before meals and at bedtime   sennosides-docusate sodium (SENOKOT-S) 8.6-50 MG tablet Take 1 tablet by mouth 2 (two) times daily.   UNABLE TO FIND Diet: Regular diet with thin liquids   No  facility-administered encounter medications on file as of 05/24/2022.     SIGNIFICANT DIAGNOSTIC EXAMS       ASSESSMENT/ PLAN:     Ok Edwards NP [facility]  Contact [phone] Monday through Friday 8am- 5pm  After hours call [phone]

## 2022-05-24 NOTE — Progress Notes (Signed)
Location:  [place] Room Number: 157-W Place of Service:  SNF (31)   CODE STATUS: dnr   No Known Allergies  Chief Complaint  Patient presents with   Acute Visit    Care plan meeting    HPI:  We have come together for her care plan meeting. - BIMS 8/15 mood 2/30: nervous; some depression. She is nonambulatory has had multiple falls without injury. She requires limited to extensive assist with her adls. Is occasionally incontinent of bladder and frequently incontinent of bowel. Dietary:  she is able to feed herself; is eating outside food. Weight is 119 pounds; is on regular diet with finger food.  Is gaining weight Therapy: none at this time  activities: none. She continues to be followed by hospice care. 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 Chronic kidney disease (CKD) stage 4(severe)  Past Medical History:  Diagnosis Date   Arthritis    Rheumatoid   Dysphagia    post CVA   Hypertension    Lupus (systemic lupus erythematosus) ([place])    Seizure ([place])    Stroke [facility])     Past Surgical History:  Procedure Laterality Date   BIOPSY  02/21/2022   Procedure: BIOPSY;  Surgeon: [name], DO;  [facility];  Service: Endoscopy;;   CESAREAN SECTION     COLONOSCOPY WITH PROPOFOL N/A 02/21/2022   Procedure: COLONOSCOPY WITH PROPOFOL;  Surgeon: [name], DO;  [facility];  Service: Endoscopy;  Laterality: N/A;  2:00pm   ESOPHAGEAL BRUSHING  02/21/2022   Procedure: ESOPHAGEAL BRUSHING;  Surgeon: [name], DO;  [facility];  Service: Endoscopy;;   ESOPHAGOGASTRODUODENOSCOPY (EGD) WITH PROPOFOL N/A 02/21/2022   Procedure: ESOPHAGOGASTRODUODENOSCOPY (EGD) WITH PROPOFOL;  Surgeon: [name], DO;  [facility];  Service:  Endoscopy;  Laterality: N/A;   POLYPECTOMY  02/21/2022   Procedure: POLYPECTOMY;  Surgeon: [name], DO;  [facility];  Service: Endoscopy;;    Social History   Socioeconomic History   Marital status: Single    Spouse name: Not on file   Number of children: Not on file   Years of education: Not on file   Highest education level: Not on file  Occupational History   Not on file  Tobacco Use   Smoking status: Never   Smokeless tobacco: Never  Vaping Use   Vaping Use: Never used  Substance and Sexual Activity   Alcohol use: No    Alcohol/week: 0.0 standard drinks of alcohol   Drug use: No   Sexual activity: Not on file  Other Topics Concern   Not on file  Social History Narrative   Lives with sister.     Social Determinants of Health   Financial Resource Strain: Not on file  Food Insecurity: Not on file  Transportation Needs: Not on file  Physical Activity: Not on file  Stress: Not on file  Social Connections: Not on file  Intimate Partner Violence: Not on file   Family History  Problem Relation Age of Onset   Diabetes Mother    Hypertension Mother    Cancer Mother    Stroke Mother    Colon cancer Father    Hypertension Sister    Hypertension Sister    Hypertension Sister    Hypertension  Sister    Hypertension Sister    Hypertension Sister    Hypertension Brother    Hypertension Brother       VITAL SIGNS BP 130/74   Pulse 66   Temp 97.7 F (36.5 C)   Resp 18   Ht '5\' 6"'$  (1.676 m)   Wt 119 lb 3.2 oz (54.1 kg)   SpO2 100%   BMI 19.24 kg/m   Outpatient Encounter Medications as of 05/24/2022  Medication Sig   acetaminophen (TYLENOL) 325 MG tablet Take 650 mg by mouth every 6 (six) hours as needed.   Balsam [place]-Castor Oil (VENELEX) OINT Apply to sacrum, bilateral buttocks and coccyx three times daily.   divalproex (DEPAKOTE) 500 MG DR tablet Take 1 tablet (500 mg total) by mouth 2 (two) times daily.   Ensure Plus (ENSURE PLUS) LIQD  Take 237 mLs by mouth 3 (three) times daily between meals.   levETIRAcetam (KEPPRA) 100 MG/ML solution Take 750 mg by mouth 2 (two) times daily.   LORazepam (ATIVAN) 2 MG/ML concentrated solution '1mg'$  as needed for continued seizure activity up to 3 doses max. If still having seizure activity after 3 doses, send to ER.   morphine (ROXANOL) 20 MG/ML concentrated solution Take 0.25 mLs (5 mg total) by mouth every 6 (six) hours as needed for severe pain.   ondansetron (ZOFRAN-ODT) 8 MG disintegrating tablet Take 8 mg by mouth every 6 (six) hours as needed for nausea or vomiting. Before meals and at bedtime   sennosides-docusate sodium (SENOKOT-S) 8.6-50 MG tablet Take 1 tablet by mouth 2 (two) times daily.   UNABLE TO FIND Diet: Regular diet with thin liquids   No facility-administered encounter medications on file as of 05/24/2022.     SIGNIFICANT DIAGNOSTIC EXAMS  PREVIOUS   04-09-22: right humerus x-ray:No acute fracture or dislocation of the humerus or forearm.   04-09-22: pelvic x-ray  No evidence of acute hip fracture. Moderate bilateral hip osteoarthritis.  04-09-22: ct of head:  1.  No acute intracranial process. 2.  Left parietal vertex scalp hematoma.  04-09-22: ct of cervical spine:  No evidence of acute cervical spine fracture or traumatic listhesis.   NO NEW EXAMS    LABS REVIEWED:   12-14-21; wbc 3.4; hgb 8.9; hct 29.2; plt 723; glucose 80; bun 20; creat 1.36; k+ 5.2; na++ 136; ca 8.7  12-18-21: wbc 4.3; hgb 8.7; hct 28.8; plt 710; glucose 83; bun 20; creat 1.36;  k+ 4.1; na++ 141; ca 8.8  01-10-22: wbc 4.5; hgb 8.0; hct 25.8; mcv 82.2 plt 603; glucose 76; bun 19; creat 1.28; k+ 3.6; na++ 1140; ca 8.5; gfr 46; protein 7.0; albumin 2.3; chol 98; ldl 48; trig 151; hdl 20; tsh 2.976 free t4: 1.00; iron 23; tibc 135 02-18-22: hgb 7.7; hct 24.7; glucose 47; bun 9; creat 0.80; k+ 3.0; na++ 136; ca 7.6; gfr>60; protein 5.8; albumin 1.9 02-19-22: wbc 3.2; hgb 8.3; hct 26.0; mcv 82.5 plt 141;  k+ 2.6  02-20-22: k+ 4.7  03-05-22: keppra level 42.0 (10-40 normal) 03-26-22: wbc 4.2; hgb 8.2; hct 26.3; mcv 86.8 plt 508; glucose 78; bun 9; creat 0.84; k+ 2.4; na++ 136; ca 7.6; gfr>60; protein 6.5; albumin 2.1; tsh 4.826; sed rate: 82 ANA+ 03-28-22: k+ 4.0 03-31-22: RPR nr; HIV nr 04-09-22: wbc 6.3; hgb 9.2 hct 30.1 mcv 91.8 plt 440; glucose 101; bun 12; creat 1.17; k + 3.5; na++ 134; ca 8.2; gfr 51 04-25-22: glucose 75; bun 12; creat 0.92; k+ 2.6; na++ 142;  ca 8.0; gfr >60;  protein 5.4; albumin 2.4. depakote 86; vitamin B 12: 421; folate 3.2; iron 33; tibc 127. 04-26-22: k+ 4.4   NO NEW LABS.   Review of Systems  Constitutional:  Negative for malaise/fatigue.  Respiratory:  Negative for cough and shortness of breath.   Cardiovascular:  Negative for chest pain, palpitations and leg swelling.  Gastrointestinal:  Negative for abdominal pain, constipation and heartburn.  Musculoskeletal:  Negative for back pain, joint pain and myalgias.  Skin: Negative.   Neurological:  Negative for dizziness.  Psychiatric/Behavioral:  The patient is not nervous/anxious.    Physical Exam Constitutional:      General: She is not in acute distress.    Appearance: She is cachectic. She is not diaphoretic.  Neck:     Thyroid: No thyromegaly.  Cardiovascular:     Rate and Rhythm: Normal rate and regular rhythm.     Pulses: Normal pulses.     Heart sounds: Normal heart sounds.  Pulmonary:     Effort: Pulmonary effort is normal. No respiratory distress.     Breath sounds: Normal breath sounds.  Abdominal:     General: Bowel sounds are normal. There is no distension.     Palpations: Abdomen is soft.     Tenderness: There is no abdominal tenderness.  Musculoskeletal:        General: Normal range of motion.     Cervical back: Neck supple.     Right lower leg: No edema.     Left lower leg: No edema.  Lymphadenopathy:     Cervical: No cervical adenopathy.  Skin:    General: Skin is warm and dry.   Neurological:     Mental Status: She is alert. Mental status is at baseline.  Psychiatric:        Mood and Affect: Mood normal.        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    Time spent with patient: 40 minutes: medications; plan of care dietary.    Ok Edwards NP [facility]   call [phone]

## 2022-05-30 DIAGNOSIS — I10 Essential (primary) hypertension: Secondary | ICD-10-CM | POA: Diagnosis not present

## 2022-05-30 DIAGNOSIS — D649 Anemia, unspecified: Secondary | ICD-10-CM | POA: Diagnosis not present

## 2022-05-30 DIAGNOSIS — E039 Hypothyroidism, unspecified: Secondary | ICD-10-CM | POA: Diagnosis not present

## 2022-05-30 DIAGNOSIS — I69398 Other sequelae of cerebral infarction: Secondary | ICD-10-CM | POA: Diagnosis not present

## 2022-06-13 DIAGNOSIS — Z1383 Encounter for screening for respiratory disorder NEC: Secondary | ICD-10-CM | POA: Diagnosis not present

## 2022-06-13 DIAGNOSIS — Z1159 Encounter for screening for other viral diseases: Secondary | ICD-10-CM | POA: Diagnosis not present

## 2022-06-13 DIAGNOSIS — I69398 Other sequelae of cerebral infarction: Secondary | ICD-10-CM | POA: Diagnosis not present

## 2022-06-18 ENCOUNTER — Encounter: Payer: Self-pay | Admitting: Adult Health

## 2022-06-18 ENCOUNTER — Non-Acute Institutional Stay (SKILLED_NURSING_FACILITY): Payer: Medicare Other | Admitting: Adult Health

## 2022-06-18 DIAGNOSIS — D649 Anemia, unspecified: Secondary | ICD-10-CM

## 2022-06-18 DIAGNOSIS — K219 Gastro-esophageal reflux disease without esophagitis: Secondary | ICD-10-CM | POA: Diagnosis not present

## 2022-06-18 DIAGNOSIS — K5909 Other constipation: Secondary | ICD-10-CM

## 2022-06-18 DIAGNOSIS — F339 Major depressive disorder, recurrent, unspecified: Secondary | ICD-10-CM | POA: Diagnosis not present

## 2022-06-18 DIAGNOSIS — Z23 Encounter for immunization: Secondary | ICD-10-CM | POA: Diagnosis not present

## 2022-06-18 NOTE — Progress Notes (Signed)
Location:  [place] Room Number: 157-W Place of Service:  SNF (31)   CODE STATUS: DNR  No Known Allergies  Chief Complaint  Patient presents with   Medical Management of Chronic Issues                          GERD without esophagitis  Chronic constipation: Major depression recurrent chronic Chronic anemia    HPI:  She is a 66 year old long term resident of this facility being seen for the management of her chronic illnesses:  GERD without esophagitis  Chronic constipation: Major depression recurrent chronic Chronic anemia. Her appetite is picking up; her weight is stable at 119 pounds. She continues to be followed by hospice care at this time. She is asking that she be able to graduate off hospice services at this time. Will put request in.   Past Medical History:  Diagnosis Date   Arthritis    Rheumatoid   Dysphagia    post CVA   Hypertension    Lupus (systemic lupus erythematosus) ([place])    Seizure ([place])    Stroke [facility])     Past Surgical History:  Procedure Laterality Date   BIOPSY  02/21/2022   Procedure: BIOPSY;  Surgeon: [name], DO;  [facility];  Service: Endoscopy;;   CESAREAN SECTION     COLONOSCOPY WITH PROPOFOL N/A 02/21/2022   Procedure: COLONOSCOPY WITH PROPOFOL;  Surgeon: [name], DO;  [facility];  Service: Endoscopy;  Laterality: N/A;  2:00pm   ESOPHAGEAL BRUSHING  02/21/2022   Procedure: ESOPHAGEAL BRUSHING;  Surgeon: [name], DO;  [facility];  Service: Endoscopy;;   ESOPHAGOGASTRODUODENOSCOPY (EGD) WITH PROPOFOL N/A 02/21/2022   Procedure: ESOPHAGOGASTRODUODENOSCOPY (EGD) WITH PROPOFOL;  Surgeon: [name], DO;  [facility];  Service: Endoscopy;  Laterality: N/A;   POLYPECTOMY  02/21/2022   Procedure: POLYPECTOMY;  Surgeon: [name], DO;  [facility];  Service: Endoscopy;;    Social History   Socioeconomic History   Marital  status: Single    Spouse name: Not on file   Number of children: Not on file   Years of education: Not on file   Highest education level: Not on file  Occupational History   Not on file  Tobacco Use   Smoking status: Never   Smokeless tobacco: Never  Vaping Use   Vaping Use: Never used  Substance and Sexual Activity   Alcohol use: No    Alcohol/week: 0.0 standard drinks of alcohol   Drug use: No   Sexual activity: Not on file  Other Topics Concern   Not on file  Social History Narrative   Lives with sister.     Social Determinants of Health   Financial Resource Strain: Not on file  Food Insecurity: Not on file  Transportation Needs: Not on file  Physical Activity: Not on file  Stress: Not on file  Social Connections: Not on file  Intimate Partner Violence: Not on file   Family History  Problem Relation Age of Onset   Diabetes Mother    Hypertension Mother    Cancer Mother    Stroke Mother    Colon cancer Father    Hypertension Sister    Hypertension Sister    Hypertension Sister    Hypertension Sister    Hypertension Sister    Hypertension Sister    Hypertension  Brother    Hypertension Brother       VITAL SIGNS BP 117/64   Pulse 87   Temp 97.7 F (36.5 C)   Resp 18   Ht '5\' 6"'$  (1.676 m)   Wt 119 lb 3.2 oz (54.1 kg)   SpO2 98%   BMI 19.24 kg/m   Outpatient Encounter Medications as of 06/18/2022  Medication Sig   acetaminophen (TYLENOL) 325 MG tablet Take 650 mg by mouth every 6 (six) hours as needed.   divalproex (DEPAKOTE) 500 MG DR tablet Take 1 tablet (500 mg total) by mouth 2 (two) times daily.   Ensure Plus (ENSURE PLUS) LIQD Take 237 mLs by mouth 3 (three) times daily between meals.   levETIRAcetam (KEPPRA) 100 MG/ML solution Take 750 mg by mouth 2 (two) times daily.   LORazepam (ATIVAN) 2 MG/ML concentrated solution '1mg'$  as needed for continued seizure activity up to 3 doses max. If still having seizure activity after 3 doses, send to ER.    morphine (ROXANOL) 20 MG/ML concentrated solution Take 0.25 mLs (5 mg total) by mouth every 6 (six) hours as needed for severe pain.   ondansetron (ZOFRAN-ODT) 8 MG disintegrating tablet Take 8 mg by mouth every 6 (six) hours as needed for nausea or vomiting. Before meals and at bedtime   sennosides-docusate sodium (SENOKOT-S) 8.6-50 MG tablet Take 1 tablet by mouth 2 (two) times daily.   UNABLE TO FIND Diet: Regular diet with thin liquids   [DISCONTINUED] Balsam [place]-Castor Oil (VENELEX) OINT Apply to sacrum, bilateral buttocks and coccyx three times daily.   No facility-administered encounter medications on file as of 06/18/2022.     SIGNIFICANT DIAGNOSTIC EXAMS  PREVIOUS   04-09-22: right humerus x-ray:No acute fracture or dislocation of the humerus or forearm.   04-09-22: pelvic x-ray  No evidence of acute hip fracture. Moderate bilateral hip osteoarthritis.  04-09-22: ct of head:  1.  No acute intracranial process. 2.  Left parietal vertex scalp hematoma.  04-09-22: ct of cervical spine:  No evidence of acute cervical spine fracture or traumatic listhesis.   NO NEW EXAMS    LABS REVIEWED:   12-14-21; wbc 3.4; hgb 8.9; hct 29.2; plt 723; glucose 80; bun 20; creat 1.36; k+ 5.2; na++ 136; ca 8.7  12-18-21: wbc 4.3; hgb 8.7; hct 28.8; plt 710; glucose 83; bun 20; creat 1.36;  k+ 4.1; na++ 141; ca 8.8  01-10-22: wbc 4.5; hgb 8.0; hct 25.8; mcv 82.2 plt 603; glucose 76; bun 19; creat 1.28; k+ 3.6; na++ 1140; ca 8.5; gfr 46; protein 7.0; albumin 2.3; chol 98; ldl 48; trig 151; hdl 20; tsh 2.976 free t4: 1.00; iron 23; tibc 135 02-18-22: hgb 7.7; hct 24.7; glucose 47; bun 9; creat 0.80; k+ 3.0; na++ 136; ca 7.6; gfr>60; protein 5.8; albumin 1.9 02-19-22: wbc 3.2; hgb 8.3; hct 26.0; mcv 82.5 plt 141; k+ 2.6  02-20-22: k+ 4.7  03-05-22: keppra level 42.0 (10-40 normal) 03-26-22: wbc 4.2; hgb 8.2; hct 26.3; mcv 86.8 plt 508; glucose 78; bun 9; creat 0.84; k+ 2.4; na++ 136; ca 7.6; gfr>60; protein 6.5;  albumin 2.1; tsh 4.826; sed rate: 82 ANA+ 03-28-22: k+ 4.0 03-31-22: RPR nr; HIV nr 04-09-22: wbc 6.3; hgb 9.2 hct 30.1 mcv 91.8 plt 440; glucose 101; bun 12; creat 1.17; k + 3.5; na++ 134; ca 8.2; gfr 51 04-25-22: glucose 75; bun 12; creat 0.92; k+ 2.6; na++ 142; ca 8.0; gfr >60;  protein 5.4; albumin 2.4. depakote 86; vitamin B 12:  421; folate 3.2; iron 33; tibc 127. 04-26-22: k+ 4.4   NO NEW LABS.   Review of Systems  Constitutional:  Negative for malaise/fatigue.  Respiratory:  Negative for cough and shortness of breath.   Cardiovascular:  Negative for chest pain, palpitations and leg swelling.  Gastrointestinal:  Negative for abdominal pain, constipation and heartburn.  Musculoskeletal:  Negative for back pain, joint pain and myalgias.  Skin: Negative.   Neurological:  Negative for dizziness.  Psychiatric/Behavioral:  The patient has insomnia. The patient is not nervous/anxious.    Physical Exam Constitutional:      General: She is not in acute distress.    Appearance: She is cachectic. She is not diaphoretic.  Neck:     Thyroid: No thyromegaly.  Cardiovascular:     Rate and Rhythm: Normal rate and regular rhythm.     Pulses: Normal pulses.     Heart sounds: Normal heart sounds.  Pulmonary:     Effort: Pulmonary effort is normal. No respiratory distress.     Breath sounds: Normal breath sounds.  Abdominal:     General: Bowel sounds are normal. There is no distension.     Palpations: Abdomen is soft.     Tenderness: There is no abdominal tenderness.  Musculoskeletal:        General: Normal range of motion.     Cervical back: Neck supple.     Right lower leg: No edema.     Left lower leg: No edema.  Lymphadenopathy:     Cervical: No cervical adenopathy.  Skin:    General: Skin is warm and dry.  Neurological:     Mental Status: She is alert. Mental status is at baseline.  Psychiatric:        Mood and Affect: Mood normal.      ASSESSMENT/ PLAN:  TODAY  GERD without  esophagitis is off protonix  2. Chronic constipation: will continue senna s twice daily   3. Major depression recurrent chronic: is off zoloft weight is 119 pounds;   4. Chronic anemia: hgb 8.7 will monitor   PREVIOUS   5. Thrombocytosis: plt 440 is on brilinta and asa   6. Hyperlipidemia LDL Goal <70: ldl 48 is off lipitor   7. History of CVA: is neurologically stable is off asa and brilanta   8. Neurocognitive deficits: will continue to monitor  9. Chronic kidney disease (CKD) stage 4 (severe) bun 20; creat 1.36   10. Rheumatoid arthritis involving multiple sites with positive rheumatoid factor: is off steroids   11. Neurocognitive disorder   12. 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 Will continue keppra 750 mg twice daily and depakote 500 mg twice daily   13. Protein calorie malnutrition severe: protein 6.5 albumin 2.1; will continue supplements as directed   14. Benign essential HTN: b/p 17/64: will continue is off norvasc and atenolol   15. Systemic lupus erythematous: is off steroids will monitor   Will begin melatonin 3 mg nightly for sleep will stop hospice per her request.   Ok Edwards NP [facility]   call [phone]

## 2022-06-24 DIAGNOSIS — Z1383 Encounter for screening for respiratory disorder NEC: Secondary | ICD-10-CM | POA: Diagnosis not present

## 2022-06-24 DIAGNOSIS — Z1159 Encounter for screening for other viral diseases: Secondary | ICD-10-CM | POA: Diagnosis not present

## 2022-06-24 DIAGNOSIS — I69398 Other sequelae of cerebral infarction: Secondary | ICD-10-CM | POA: Diagnosis not present

## 2022-06-29 DIAGNOSIS — I69398 Other sequelae of cerebral infarction: Secondary | ICD-10-CM | POA: Diagnosis not present

## 2022-06-29 DIAGNOSIS — D649 Anemia, unspecified: Secondary | ICD-10-CM | POA: Diagnosis not present

## 2022-06-29 DIAGNOSIS — E039 Hypothyroidism, unspecified: Secondary | ICD-10-CM | POA: Diagnosis not present

## 2022-06-29 DIAGNOSIS — I10 Essential (primary) hypertension: Secondary | ICD-10-CM | POA: Diagnosis not present

## 2022-07-08 ENCOUNTER — Encounter: Payer: Self-pay | Admitting: Adult Health

## 2022-07-08 ENCOUNTER — Other Ambulatory Visit: Payer: Self-pay | Admitting: Adult Health

## 2022-07-08 ENCOUNTER — Non-Acute Institutional Stay (SKILLED_NURSING_FACILITY): Payer: Medicare Other | Admitting: Adult Health

## 2022-07-08 DIAGNOSIS — R29818 Other symptoms and signs involving the nervous system: Secondary | ICD-10-CM

## 2022-07-08 DIAGNOSIS — Z8673 Personal history of transient ischemic attack (TIA), and cerebral infarction without residual deficits: Secondary | ICD-10-CM

## 2022-07-08 DIAGNOSIS — E785 Hyperlipidemia, unspecified: Secondary | ICD-10-CM

## 2022-07-08 DIAGNOSIS — R4189 Other symptoms and signs involving cognitive functions and awareness: Secondary | ICD-10-CM

## 2022-07-08 DIAGNOSIS — D75839 Thrombocytosis, unspecified: Secondary | ICD-10-CM | POA: Diagnosis not present

## 2022-07-08 MED ORDER — LORAZEPAM 2 MG/ML IJ SOLN: 1.0000 mg | INTRAMUSCULAR | 0 refills | Status: DC | PRN

## 2022-07-08 NOTE — Progress Notes (Unsigned)
Location:  [place] Room Number: 211 Place of Service:  SNF (31)   CODE STATUS: dnr   No Known Allergies  Chief Complaint  Patient presents with   Medical Management of Chronic Issues                     Thrombocytosis:  Hyperlipidemia LDL goal <70  History of CVA:  Neurocognitive deficits    HPI:  She is a 66 year old long term resident of this facility being seen for the management of her chronic illnesses:  Thrombocytosis:  Hyperlipidemia LDL goal <70  History of CVA:  Neurocognitive deficits. She is eating better. She continues to be followed by hospice care; her final decision was to remain. There are no reports of anxiety or agitation.   Past Medical History:  Diagnosis Date   Arthritis    Rheumatoid   Dysphagia    post CVA   Hypertension    Lupus (systemic lupus erythematosus) ([place])    Seizure ([place])    Stroke [facility])     Past Surgical History:  Procedure Laterality Date   BIOPSY  02/21/2022   Procedure: BIOPSY;  Surgeon: [name], DO;  [facility];  Service: Endoscopy;;   CESAREAN SECTION     COLONOSCOPY WITH PROPOFOL N/A 02/21/2022   Procedure: COLONOSCOPY WITH PROPOFOL;  Surgeon: [name], DO;  [facility];  Service: Endoscopy;  Laterality: N/A;  2:00pm   ESOPHAGEAL BRUSHING  02/21/2022   Procedure: ESOPHAGEAL BRUSHING;  Surgeon: [name], DO;  [facility];  Service: Endoscopy;;   ESOPHAGOGASTRODUODENOSCOPY (EGD) WITH PROPOFOL N/A 02/21/2022   Procedure: ESOPHAGOGASTRODUODENOSCOPY (EGD) WITH PROPOFOL;  Surgeon: [name], DO;  [facility];  Service: Endoscopy;  Laterality: N/A;   POLYPECTOMY  02/21/2022   Procedure: POLYPECTOMY;  Surgeon: [name], DO;  [facility];  Service: Endoscopy;;    Social History   Socioeconomic History   Marital status: Single    Spouse name: Not on file   Number of children: Not on file   Years of education:  Not on file   Highest education level: Not on file  Occupational History   Not on file  Tobacco Use   Smoking status: Never   Smokeless tobacco: Never  Vaping Use   Vaping Use: Never used  Substance and Sexual Activity   Alcohol use: No    Alcohol/week: 0.0 standard drinks of alcohol   Drug use: No   Sexual activity: Not on file  Other Topics Concern   Not on file  Social History Narrative   Lives with sister.     Social Determinants of Health   Financial Resource Strain: Not on file  Food Insecurity: Not on file  Transportation Needs: Not on file  Physical Activity: Not on file  Stress: Not on file  Social Connections: Not on file  Intimate Partner Violence: Not on file   Family History  Problem Relation Age of Onset   Diabetes Mother    Hypertension Mother    Cancer Mother    Stroke Mother    Colon cancer Father    Hypertension Sister    Hypertension Sister    Hypertension Sister    Hypertension Sister    Hypertension Sister    Hypertension Sister    Hypertension Brother    Hypertension Brother       VITAL SIGNS BP (!) 85/59  Pulse 69   Temp (!) 97.3 F (36.3 C)   Resp 18   Ht '5\' 6"'$  (1.676 m)   Wt 119 lb 3.2 oz (54.1 kg)   SpO2 98%   BMI 19.24 kg/m   Outpatient Encounter Medications as of 07/08/2022  Medication Sig   acetaminophen (TYLENOL) 325 MG tablet Take 650 mg by mouth every 6 (six) hours as needed.   divalproex (DEPAKOTE) 500 MG DR tablet Take 1 tablet (500 mg total) by mouth 2 (two) times daily.   Ensure Plus (ENSURE PLUS) LIQD Take 237 mLs by mouth 3 (three) times daily between meals.   levETIRAcetam (KEPPRA) 100 MG/ML solution Take 750 mg by mouth 2 (two) times daily.   LORazepam (ATIVAN) 2 MG/ML injection Inject 0.5 mLs (1 mg total) into the muscle every 15 (fifteen) minutes as needed. Give 1 mg IM very 15 minutes as needed for up to 3 doses; if not relieved after 3rd dose send to the ED   morphine (ROXANOL) 20 MG/ML concentrated  solution Take 0.25 mLs (5 mg total) by mouth every 6 (six) hours as needed for severe pain.   ondansetron (ZOFRAN-ODT) 8 MG disintegrating tablet Take 8 mg by mouth every 6 (six) hours as needed for nausea or vomiting. Before meals and at bedtime   sennosides-docusate sodium (SENOKOT-S) 8.6-50 MG tablet Take 1 tablet by mouth 2 (two) times daily.   UNABLE TO FIND Diet: Regular diet with thin liquids   No facility-administered encounter medications on file as of 07/08/2022.     SIGNIFICANT DIAGNOSTIC EXAMS  PREVIOUS   04-09-22: right humerus x-ray:No acute fracture or dislocation of the humerus or forearm.   04-09-22: pelvic x-ray  No evidence of acute hip fracture. Moderate bilateral hip osteoarthritis.  04-09-22: ct of head:  1.  No acute intracranial process. 2.  Left parietal vertex scalp hematoma.  04-09-22: ct of cervical spine:  No evidence of acute cervical spine fracture or traumatic listhesis.   NO NEW EXAMS    LABS REVIEWED:   12-14-21; wbc 3.4; hgb 8.9; hct 29.2; plt 723; glucose 80; bun 20; creat 1.36; k+ 5.2; na++ 136; ca 8.7  12-18-21: wbc 4.3; hgb 8.7; hct 28.8; plt 710; glucose 83; bun 20; creat 1.36;  k+ 4.1; na++ 141; ca 8.8  01-10-22: wbc 4.5; hgb 8.0; hct 25.8; mcv 82.2 plt 603; glucose 76; bun 19; creat 1.28; k+ 3.6; na++ 1140; ca 8.5; gfr 46; protein 7.0; albumin 2.3; chol 98; ldl 48; trig 151; hdl 20; tsh 2.976 free t4: 1.00; iron 23; tibc 135 02-18-22: hgb 7.7; hct 24.7; glucose 47; bun 9; creat 0.80; k+ 3.0; na++ 136; ca 7.6; gfr>60; protein 5.8; albumin 1.9 02-19-22: wbc 3.2; hgb 8.3; hct 26.0; mcv 82.5 plt 141; k+ 2.6  02-20-22: k+ 4.7  03-05-22: keppra level 42.0 (10-40 normal) 03-26-22: wbc 4.2; hgb 8.2; hct 26.3; mcv 86.8 plt 508; glucose 78; bun 9; creat 0.84; k+ 2.4; na++ 136; ca 7.6; gfr>60; protein 6.5; albumin 2.1; tsh 4.826; sed rate: 82 ANA+ 03-28-22: k+ 4.0 03-31-22: RPR nr; HIV nr 04-09-22: wbc 6.3; hgb 9.2 hct 30.1 mcv 91.8 plt 440; glucose 101; bun 12;  creat 1.17; k + 3.5; na++ 134; ca 8.2; gfr 51 04-25-22: glucose 75; bun 12; creat 0.92; k+ 2.6; na++ 142; ca 8.0; gfr >60;  protein 5.4; albumin 2.4. depakote 86; vitamin B 12: 421; folate 3.2; iron 33; tibc 127. 04-26-22: k+ 4.4   NO NEW LABS.   Review of Systems  Constitutional:  Negative for malaise/fatigue.  Respiratory:  Negative for cough and shortness of breath.   Cardiovascular:  Negative for chest pain, palpitations and leg swelling.  Gastrointestinal:  Negative for abdominal pain, constipation and heartburn.  Musculoskeletal:  Negative for back pain, joint pain and myalgias.  Skin: Negative.   Neurological:  Negative for dizziness.  Psychiatric/Behavioral:  The patient is not nervous/anxious.    Physical Exam Constitutional:      General: She is not in acute distress.    Appearance: She is underweight. She is not diaphoretic.  Neck:     Thyroid: No thyromegaly.  Cardiovascular:     Rate and Rhythm: Normal rate and regular rhythm.     Pulses: Normal pulses.     Heart sounds: Normal heart sounds.  Pulmonary:     Effort: Pulmonary effort is normal. No respiratory distress.     Breath sounds: Normal breath sounds.  Abdominal:     General: Bowel sounds are normal. There is no distension.     Palpations: Abdomen is soft.     Tenderness: There is no abdominal tenderness.  Musculoskeletal:        General: Normal range of motion.     Cervical back: Neck supple.     Right lower leg: No edema.     Left lower leg: No edema.  Lymphadenopathy:     Cervical: No cervical adenopathy.  Skin:    General: Skin is warm and dry.  Neurological:     Mental Status: She is alert. Mental status is at baseline.  Psychiatric:        Mood and Affect: Mood normal.         ASSESSMENT/ PLAN:  TODAY  Thrombocytosis: plt on 440   2. Hyperlipidemia LDL goal <70 ldl 48 is off lipitor  3. History of CVA: is stable is off asa; brilanta  4. Neurocognitive deficits will monitor    PREVIOUS   5. Chronic kidney disease (CKD) stage 4 (severe) bun 20; creat 1.36   6. Rheumatoid arthritis involving multiple sites with positive rheumatoid factor: is off steroids   7. Neurocognitive disorder   8. 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 Will continue keppra 750 mg twice daily and depakote 500 mg twice daily   9. Protein calorie malnutrition severe: protein 6.5 albumin 2.1; will continue supplements as directed   10. Benign essential HTN: b/p 17/64: will continue is off norvasc and atenolol   11. Systemic lupus erythematous: is off steroids will monitor   12. GERD without esophagitis is off protonix  13. Chronic constipation: will continue senna s twice daily   14. Major depression recurrent chronic: is off zoloft weight is 119 pounds;   15. Chronic anemia: hgb 8.7 will monitor      Ok Edwards NP [facility]  call [phone]

## 2022-07-17 ENCOUNTER — Encounter: Payer: Self-pay | Admitting: Internal Medicine

## 2022-07-25 ENCOUNTER — Ambulatory Visit: Payer: Medicare Other | Admitting: Neurology

## 2022-07-30 DIAGNOSIS — I10 Essential (primary) hypertension: Secondary | ICD-10-CM | POA: Diagnosis not present

## 2022-07-30 DIAGNOSIS — I69398 Other sequelae of cerebral infarction: Secondary | ICD-10-CM | POA: Diagnosis not present

## 2022-07-30 DIAGNOSIS — D649 Anemia, unspecified: Secondary | ICD-10-CM | POA: Diagnosis not present

## 2022-07-30 DIAGNOSIS — E039 Hypothyroidism, unspecified: Secondary | ICD-10-CM | POA: Diagnosis not present

## 2022-08-12 ENCOUNTER — Non-Acute Institutional Stay (SKILLED_NURSING_FACILITY): Admitting: Adult Health

## 2022-08-12 ENCOUNTER — Encounter: Payer: Self-pay | Admitting: Adult Health

## 2022-08-12 DIAGNOSIS — R4189 Other symptoms and signs involving cognitive functions and awareness: Secondary | ICD-10-CM

## 2022-08-12 DIAGNOSIS — M0579 Rheumatoid arthritis with rheumatoid factor of multiple sites without organ or systems involvement: Secondary | ICD-10-CM

## 2022-08-12 DIAGNOSIS — N184 Chronic kidney disease, stage 4 (severe): Secondary | ICD-10-CM

## 2022-08-12 DIAGNOSIS — R29818 Other symptoms and signs involving the nervous system: Secondary | ICD-10-CM

## 2022-08-12 DIAGNOSIS — R569 Unspecified convulsions: Secondary | ICD-10-CM | POA: Diagnosis not present

## 2022-08-12 DIAGNOSIS — I69398 Other sequelae of cerebral infarction: Secondary | ICD-10-CM | POA: Diagnosis not present

## 2022-08-12 NOTE — Progress Notes (Unsigned)
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 There are no reports of uncontrolled pain; her weight is stable; she does spend all of her time in bed per her choice.   Past Medical History:  Diagnosis Date   Arthritis    Rheumatoid   Dysphagia    post CVA   Hypertension    Lupus (systemic lupus erythematosus) ([place])    Seizure ([place])    Stroke [facility])     Past Surgical History:  Procedure Laterality Date   BIOPSY  02/21/2022   Procedure: BIOPSY;  Surgeon: [name], DO;  [facility];  Service: Endoscopy;;   CESAREAN SECTION     COLONOSCOPY WITH PROPOFOL N/A 02/21/2022   Procedure: COLONOSCOPY WITH PROPOFOL;  Surgeon: [name], DO;  [facility];  Service: Endoscopy;  Laterality: N/A;  2:00pm   ESOPHAGEAL BRUSHING  02/21/2022   Procedure: ESOPHAGEAL BRUSHING;  Surgeon: [name], DO;  [facility];  Service: Endoscopy;;   ESOPHAGOGASTRODUODENOSCOPY (EGD) WITH PROPOFOL N/A 02/21/2022   Procedure: ESOPHAGOGASTRODUODENOSCOPY (EGD) WITH PROPOFOL;  Surgeon: [name], DO;  [facility];  Service: Endoscopy;  Laterality: N/A;   POLYPECTOMY  02/21/2022   Procedure: POLYPECTOMY;  Surgeon: [name], DO;   [facility];  Service: Endoscopy;;    Social History   Socioeconomic History   Marital status: Single    Spouse name: Not on file   Number of children: Not on file   Years of education: Not on file   Highest education level: Not on file  Occupational History   Not on file  Tobacco Use   Smoking status: Never   Smokeless tobacco: Never  Vaping Use   Vaping Use: Never used  Substance and Sexual Activity   Alcohol use: No    Alcohol/week: 0.0 standard drinks of alcohol   Drug use: No   Sexual activity: Not on file  Other Topics Concern   Not on file  Social History Narrative   Lives with sister.     Social Determinants of Health   Financial Resource Strain: Not on file  Food Insecurity: Not on file  Transportation Needs: Not on file  Physical Activity: Not on file  Stress: Not on file  Social Connections: Not on file  Intimate Partner Violence: Not on file   Family History  Problem Relation Age of Onset   Diabetes Mother    Hypertension Mother    Cancer Mother    Stroke Mother    Colon cancer Father    Hypertension Sister    Hypertension Sister    Hypertension Sister    Hypertension Sister    Hypertension Sister    Hypertension Sister    Hypertension Brother  Hypertension Brother       VITAL SIGNS BP (!) 147/59   Pulse 87   Temp 98 F (36.7 C)   Resp 16   Ht '5\' 6"'$  (1.676 m)   Wt 121 lb 6.4 oz (55.1 kg)   BMI 19.59 kg/m   Outpatient Encounter Medications as of 08/12/2022  Medication Sig   acetaminophen (TYLENOL) 325 MG tablet Take 650 mg by mouth every 6 (six) hours as needed.   divalproex (DEPAKOTE) 500 MG DR tablet Take 1 tablet (500 mg total) by mouth 2 (two) times daily.   Ensure Plus (ENSURE PLUS) LIQD Take 237 mLs by mouth 3 (three) times daily between meals.   levETIRAcetam (KEPPRA) 100 MG/ML solution Take 750 mg by mouth 2 (two) times daily.   LORazepam (ATIVAN) 2 MG/ML injection Inject 0.5 mLs (1 mg total) into the muscle  every 15 (fifteen) minutes as needed. Give 1 mg IM very 15 minutes as needed for up to 3 doses; if not relieved after 3rd dose send to the ED   morphine (ROXANOL) 20 MG/ML concentrated solution Take 0.25 mLs (5 mg total) by mouth every 6 (six) hours as needed for severe pain.   ondansetron (ZOFRAN-ODT) 8 MG disintegrating tablet Take 8 mg by mouth every 6 (six) hours as needed for nausea or vomiting. Before meals and at bedtime   sennosides-docusate sodium (SENOKOT-S) 8.6-50 MG tablet Take 1 tablet by mouth 2 (two) times daily.   UNABLE TO FIND Diet: Regular diet with thin liquids   No facility-administered encounter medications on file as of 08/12/2022.     SIGNIFICANT DIAGNOSTIC EXAMS  PREVIOUS   04-09-22: right humerus x-ray:No acute fracture or dislocation of the humerus or forearm.   04-09-22: pelvic x-ray  No evidence of acute hip fracture. Moderate bilateral hip osteoarthritis.  04-09-22: ct of head:  1.  No acute intracranial process. 2.  Left parietal vertex scalp hematoma.  04-09-22: ct of cervical spine:  No evidence of acute cervical spine fracture or traumatic listhesis.   NO NEW EXAMS    LABS REVIEWED:   12-14-21; wbc 3.4; hgb 8.9; hct 29.2; plt 723; glucose 80; bun 20; creat 1.36; k+ 5.2; na++ 136; ca 8.7  12-18-21: wbc 4.3; hgb 8.7; hct 28.8; plt 710; glucose 83; bun 20; creat 1.36;  k+ 4.1; na++ 141; ca 8.8  01-10-22: wbc 4.5; hgb 8.0; hct 25.8; mcv 82.2 plt 603; glucose 76; bun 19; creat 1.28; k+ 3.6; na++ 1140; ca 8.5; gfr 46; protein 7.0; albumin 2.3; chol 98; ldl 48; trig 151; hdl 20; tsh 2.976 free t4: 1.00; iron 23; tibc 135 02-18-22: hgb 7.7; hct 24.7; glucose 47; bun 9; creat 0.80; k+ 3.0; na++ 136; ca 7.6; gfr>60; protein 5.8; albumin 1.9 02-19-22: wbc 3.2; hgb 8.3; hct 26.0; mcv 82.5 plt 141; k+ 2.6  02-20-22: k+ 4.7  03-05-22: keppra level 42.0 (10-40 normal) 03-26-22: wbc 4.2; hgb 8.2; hct 26.3; mcv 86.8 plt 508; glucose 78; bun 9; creat 0.84; k+ 2.4; na++ 136; ca  7.6; gfr>60; protein 6.5; albumin 2.1; tsh 4.826; sed rate: 82 ANA+ 03-28-22: k+ 4.0 03-31-22: RPR nr; HIV nr 04-09-22: wbc 6.3; hgb 9.2 hct 30.1 mcv 91.8 plt 440; glucose 101; bun 12; creat 1.17; k + 3.5; na++ 134; ca 8.2; gfr 51 04-25-22: glucose 75; bun 12; creat 0.92; k+ 2.6; na++ 142; ca 8.0; gfr >60;  protein 5.4; albumin 2.4. depakote 86; vitamin B 12: 421; folate 3.2; iron 33; tibc 127. 04-26-22: k+ 4.4  NO NEW LABS.   Review of Systems  Constitutional:  Negative for malaise/fatigue.  Respiratory:  Negative for cough and shortness of breath.   Cardiovascular:  Negative for chest pain, palpitations and leg swelling.  Gastrointestinal:  Negative for abdominal pain, constipation and heartburn.  Musculoskeletal:  Negative for back pain, joint pain and myalgias.  Skin: Negative.   Neurological:  Negative for dizziness.  Psychiatric/Behavioral:  The patient is not nervous/anxious.    Physical Exam Constitutional:      General: She is not in acute distress.    Appearance: She is underweight. She is not diaphoretic.  Neck:     Thyroid: No thyromegaly.  Cardiovascular:     Rate and Rhythm: Normal rate and regular rhythm.     Pulses: Normal pulses.     Heart sounds: Normal heart sounds.  Pulmonary:     Effort: Pulmonary effort is normal. No respiratory distress.     Breath sounds: Normal breath sounds.  Abdominal:     General: Bowel sounds are normal. There is no distension.     Palpations: Abdomen is soft.     Tenderness: There is no abdominal tenderness.  Musculoskeletal:        General: Normal range of motion.     Cervical back: Neck supple.     Right lower leg: No edema.     Left lower leg: No edema.  Lymphadenopathy:     Cervical: No cervical adenopathy.  Skin:    General: Skin is warm and dry.  Neurological:     Mental Status: She is alert. Mental status is at baseline.  Psychiatric:        Mood and Affect: Mood normal.          ASSESSMENT/  PLAN:  TODAY  Chronic kidney disease (CKD) stage 4 (severe): bun 20; creat 1.36  2. Rheumatoid arthritis involving multiple sites with positive rheumatoid factor: is off steroids  3. Neurocognitive disorder: will monitor  4. 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 Protein calorie malnutrition severe: protein 6.5 albumin 2.1; will continue supplements as directed   6. Benign essential HTN: b/p 17/64: will continue is off norvasc and atenolol   7. Systemic lupus erythematous: is off steroids will monitor   8. GERD without esophagitis is off protonix  9. Chronic constipation: will continue senna s twice daily   10. Major depression recurrent chronic: is off zoloft weight is 119 pounds;   11. Chronic anemia: hgb 8.7 will monitor   12. Thrombocytosis: plt on 440   13. Hyperlipidemia LDL goal <70 ldl 48 is off lipitor  14. History of CVA: is stable is off asa; brilanta  15. Neurocognitive deficits will monitor     Ok Edwards NP [facility]   call [phone]

## 2022-08-16 ENCOUNTER — Non-Acute Institutional Stay (SKILLED_NURSING_FACILITY): Admitting: Adult Health

## 2022-08-16 ENCOUNTER — Encounter: Payer: Self-pay | Admitting: Adult Health

## 2022-08-16 DIAGNOSIS — E43 Unspecified severe protein-calorie malnutrition: Secondary | ICD-10-CM | POA: Diagnosis not present

## 2022-08-16 DIAGNOSIS — R569 Unspecified convulsions: Secondary | ICD-10-CM | POA: Diagnosis not present

## 2022-08-16 DIAGNOSIS — I69398 Other sequelae of cerebral infarction: Secondary | ICD-10-CM | POA: Diagnosis not present

## 2022-08-16 DIAGNOSIS — N184 Chronic kidney disease, stage 4 (severe): Secondary | ICD-10-CM

## 2022-08-16 NOTE — Progress Notes (Signed)
Location:  [facility] Room Number: 157-W Place of Service:  SNF (31)   CODE STATUS: DNR  No Known Allergies  Chief Complaint  Patient presents with   Acute Visit    Care plan meeting     HPI:  We have come together for her care plan meeting. BIS 13/15 mood 1/30: nervous at times. Does ambulate in hallway at times; no falls. She requires moderate assist with adls. She is continent of bladder and bowel. Dietary: has good appetite; feeds self weight is 121.4 pounds. Therapy: none at this time. Activities: does read newspaper. She continues to be followed for her chronic illnesses including: Seizure as late effect cerebrovascular accident  Chronic kidney disease (CKD) stage IV  Protein calorie malnutrition, severe  Past Medical History:  Diagnosis Date   Arthritis    Rheumatoid   Dysphagia    post CVA   Hypertension    Lupus (systemic lupus erythematosus) ([place])    Seizure ([place])    Stroke [facility])     Past Surgical History:  Procedure Laterality Date   BIOPSY  02/21/2022   Procedure: BIOPSY;  Surgeon: [name], DO;  [facility];  Service: Endoscopy;;   CESAREAN SECTION     COLONOSCOPY WITH PROPOFOL N/A 02/21/2022   Procedure: COLONOSCOPY WITH PROPOFOL;  Surgeon: [name], DO;  [facility];  Service: Endoscopy;  Laterality: N/A;  2:00pm   ESOPHAGEAL BRUSHING  02/21/2022   Procedure: ESOPHAGEAL BRUSHING;  Surgeon: [name], DO;  [facility];  Service: Endoscopy;;   ESOPHAGOGASTRODUODENOSCOPY (EGD) WITH PROPOFOL N/A 02/21/2022   Procedure: ESOPHAGOGASTRODUODENOSCOPY (EGD) WITH PROPOFOL;  Surgeon: [name], DO;  [facility];  Service: Endoscopy;  Laterality: N/A;   POLYPECTOMY  02/21/2022   Procedure: POLYPECTOMY;  Surgeon: [name], DO;  [facility];  Service: Endoscopy;;    Social History   Socioeconomic History   Marital status: Single    Spouse name: Not on  file   Number of children: Not on file   Years of education: Not on file   Highest education level: Not on file  Occupational History   Not on file  Tobacco Use   Smoking status: Never   Smokeless tobacco: Never  Vaping Use   Vaping Use: Never used  Substance and Sexual Activity   Alcohol use: No    Alcohol/week: 0.0 standard drinks of alcohol   Drug use: No   Sexual activity: Not on file  Other Topics Concern   Not on file  Social History Narrative   Lives with sister.     Social Determinants of Health   Financial Resource Strain: Not on file  Food Insecurity: Not on file  Transportation Needs: Not on file  Physical Activity: Not on file  Stress: Not on file  Social Connections: Not on file  Intimate Partner Violence: Not on file   Family History  Problem Relation Age of Onset   Diabetes Mother    Hypertension Mother    Cancer Mother    Stroke Mother    Colon cancer Father    Hypertension Sister    Hypertension Sister    Hypertension Sister    Hypertension Sister    Hypertension Sister    Hypertension Sister    Hypertension Brother    Hypertension Brother       VITAL SIGNS BP (!) 147/59   Pulse 87   Ht '5\' 6"'$  (  1.676 m)   Wt 121 lb 6.4 oz (55.1 kg)   BMI 19.59 kg/m   Outpatient Encounter Medications as of 08/16/2022  Medication Sig   acetaminophen (TYLENOL) 325 MG tablet Take 650 mg by mouth every 6 (six) hours as needed.   divalproex (DEPAKOTE) 500 MG DR tablet Take 1 tablet (500 mg total) by mouth 2 (two) times daily.   Ensure Plus (ENSURE PLUS) LIQD Take 237 mLs by mouth daily.   levETIRAcetam (KEPPRA) 100 MG/ML solution Take 750 mg by mouth 2 (two) times daily.   LORazepam (ATIVAN) 2 MG/ML injection Inject 0.5 mLs (1 mg total) into the muscle every 15 (fifteen) minutes as needed. Give 1 mg IM very 15 minutes as needed for up to 3 doses; if not relieved after 3rd dose send to the ED   melatonin 3 MG TABS tablet Take 3 mg by mouth at bedtime.    ondansetron (ZOFRAN-ODT) 8 MG disintegrating tablet Take 8 mg by mouth every 6 (six) hours as needed for nausea or vomiting. Before meals and at bedtime   sennosides-docusate sodium (SENOKOT-S) 8.6-50 MG tablet Take 1 tablet by mouth 2 (two) times daily.   UNABLE TO FIND Diet: Regular diet. Send small portions.   [DISCONTINUED] morphine (ROXANOL) 20 MG/ML concentrated solution Take 0.25 mLs (5 mg total) by mouth every 6 (six) hours as needed for severe pain.   No facility-administered encounter medications on file as of 08/16/2022.     SIGNIFICANT DIAGNOSTIC EXAMS  PREVIOUS   04-09-22: right humerus x-ray:No acute fracture or dislocation of the humerus or forearm.   04-09-22: pelvic x-ray  No evidence of acute hip fracture. Moderate bilateral hip osteoarthritis.  04-09-22: ct of head:  1.  No acute intracranial process. 2.  Left parietal vertex scalp hematoma.  04-09-22: ct of cervical spine:  No evidence of acute cervical spine fracture or traumatic listhesis.   NO NEW EXAMS    LABS REVIEWED:   12-14-21; wbc 3.4; hgb 8.9; hct 29.2; plt 723; glucose 80; bun 20; creat 1.36; k+ 5.2; na++ 136; ca 8.7  12-18-21: wbc 4.3; hgb 8.7; hct 28.8; plt 710; glucose 83; bun 20; creat 1.36;  k+ 4.1; na++ 141; ca 8.8  01-10-22: wbc 4.5; hgb 8.0; hct 25.8; mcv 82.2 plt 603; glucose 76; bun 19; creat 1.28; k+ 3.6; na++ 1140; ca 8.5; gfr 46; protein 7.0; albumin 2.3; chol 98; ldl 48; trig 151; hdl 20; tsh 2.976 free t4: 1.00; iron 23; tibc 135 02-18-22: hgb 7.7; hct 24.7; glucose 47; bun 9; creat 0.80; k+ 3.0; na++ 136; ca 7.6; gfr>60; protein 5.8; albumin 1.9 02-19-22: wbc 3.2; hgb 8.3; hct 26.0; mcv 82.5 plt 141; k+ 2.6  02-20-22: k+ 4.7  03-05-22: keppra level 42.0 (10-40 normal) 03-26-22: wbc 4.2; hgb 8.2; hct 26.3; mcv 86.8 plt 508; glucose 78; bun 9; creat 0.84; k+ 2.4; na++ 136; ca 7.6; gfr>60; protein 6.5; albumin 2.1; tsh 4.826; sed rate: 82 ANA+ 03-28-22: k+ 4.0 03-31-22: RPR nr; HIV nr 04-09-22: wbc 6.3;  hgb 9.2 hct 30.1 mcv 91.8 plt 440; glucose 101; bun 12; creat 1.17; k + 3.5; na++ 134; ca 8.2; gfr 51 04-25-22: glucose 75; bun 12; creat 0.92; k+ 2.6; na++ 142; ca 8.0; gfr >60;  protein 5.4; albumin 2.4. depakote 86; vitamin B 12: 421; folate 3.2; iron 33; tibc 127. 04-26-22: k+ 4.4   NO NEW LABS.   Review of Systems  Constitutional:  Negative for malaise/fatigue.  Respiratory:  Negative for cough and  shortness of breath.   Cardiovascular:  Negative for chest pain, palpitations and leg swelling.  Gastrointestinal:  Negative for abdominal pain, constipation and heartburn.  Musculoskeletal:  Negative for back pain, joint pain and myalgias.  Skin: Negative.   Neurological:  Negative for dizziness.  Psychiatric/Behavioral:  The patient is not nervous/anxious.    Physical Exam Constitutional:      General: She is not in acute distress.    Appearance: She is well-developed. She is not diaphoretic.  Neck:     Thyroid: No thyromegaly.  Cardiovascular:     Rate and Rhythm: Normal rate and regular rhythm.     Heart sounds: Normal heart sounds.  Pulmonary:     Effort: Pulmonary effort is normal. No respiratory distress.     Breath sounds: Normal breath sounds.  Abdominal:     General: Bowel sounds are normal. There is no distension.     Palpations: Abdomen is soft.     Tenderness: There is no abdominal tenderness.  Musculoskeletal:        General: Normal range of motion.     Cervical back: Neck supple.     Right lower leg: No edema.     Left lower leg: No edema.  Lymphadenopathy:     Cervical: No cervical adenopathy.  Skin:    General: Skin is warm and dry.  Neurological:     Mental Status: She is alert and oriented to person, place, and time.  Psychiatric:        Mood and Affect: Mood normal.      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  She continues to be followed by hospice care.   Time spent with patient: 40 minutes: medications; overall health status; activities.   Ok Edwards NP [facility]  call [phone]

## 2022-08-29 DIAGNOSIS — I10 Essential (primary) hypertension: Secondary | ICD-10-CM | POA: Diagnosis not present

## 2022-08-29 DIAGNOSIS — I69398 Other sequelae of cerebral infarction: Secondary | ICD-10-CM | POA: Diagnosis not present

## 2022-08-29 DIAGNOSIS — D649 Anemia, unspecified: Secondary | ICD-10-CM | POA: Diagnosis not present

## 2022-08-29 DIAGNOSIS — E039 Hypothyroidism, unspecified: Secondary | ICD-10-CM | POA: Diagnosis not present

## 2022-09-05 ENCOUNTER — Encounter: Payer: Self-pay | Admitting: Adult Health

## 2022-09-05 ENCOUNTER — Non-Acute Institutional Stay (SKILLED_NURSING_FACILITY): Payer: Medicare Other | Admitting: Adult Health

## 2022-09-05 DIAGNOSIS — I10 Essential (primary) hypertension: Secondary | ICD-10-CM

## 2022-09-05 DIAGNOSIS — Z872 Personal history of diseases of the skin and subcutaneous tissue: Secondary | ICD-10-CM | POA: Diagnosis not present

## 2022-09-05 DIAGNOSIS — K219 Gastro-esophageal reflux disease without esophagitis: Secondary | ICD-10-CM

## 2022-09-05 DIAGNOSIS — E43 Unspecified severe protein-calorie malnutrition: Secondary | ICD-10-CM | POA: Diagnosis not present

## 2022-09-05 NOTE — Progress Notes (Signed)
Location:  [facility] Room Number: 157-W Place of Service:  SNF (31)   CODE STATUS: DNR  No Known Allergies  Chief Complaint  Patient presents with   Medical Management of Chronic Issues                       Protein calorie malnutrition severe:  Benign essential hypertension: Systemic lupus erythematous:  GERD without esophagitis    HPI:  Rebekah [name] is a 66 year old long term resident of this facility being seen for the management of her chronic illnesses: Protein calorie malnutrition severe:  Benign essential hypertension: Systemic lupus erythematous:  GERD without esophagitis. Rebekah [name] continues to be followed by hospice care. There re no reports of uncontrolled pain. Her appetite is improving; Rebekah [name] is slowly gaining weight.   Past Medical History:  Diagnosis Date   Arthritis    Rheumatoid   Dysphagia    post CVA   Hypertension    Lupus (systemic lupus erythematosus) ([place])    Seizure ([place])    Stroke [facility])     Past Surgical History:  Procedure Laterality Date   BIOPSY  02/21/2022   Procedure: BIOPSY;  Surgeon: [name], DO;  [facility];  Service: Endoscopy;;   CESAREAN SECTION     COLONOSCOPY WITH PROPOFOL N/A 02/21/2022   Procedure: COLONOSCOPY WITH PROPOFOL;  Surgeon: [name], DO;  [facility];  Service: Endoscopy;  Laterality: N/A;  2:00pm   ESOPHAGEAL BRUSHING  02/21/2022   Procedure: ESOPHAGEAL BRUSHING;  Surgeon: [name], DO;  [facility];  Service: Endoscopy;;   ESOPHAGOGASTRODUODENOSCOPY (EGD) WITH PROPOFOL N/A 02/21/2022   Procedure: ESOPHAGOGASTRODUODENOSCOPY (EGD) WITH PROPOFOL;  Surgeon: [name], DO;  [facility];  Service: Endoscopy;  Laterality: N/A;   POLYPECTOMY  02/21/2022   Procedure: POLYPECTOMY;  Surgeon: [name], DO;  [facility];  Service: Endoscopy;;    Social History   Socioeconomic History   Marital status: Single    Spouse name:  Not on file   Number of children: Not on file   Years of education: Not on file   Highest education level: Not on file  Occupational History   Not on file  Tobacco Use   Smoking status: Never   Smokeless tobacco: Never  Vaping Use   Vaping Use: Never used  Substance and Sexual Activity   Alcohol use: No    Alcohol/week: 0.0 standard drinks of alcohol   Drug use: No   Sexual activity: Not on file  Other Topics Concern   Not on file  Social History Narrative   Lives with sister.     Social Determinants of Health   Financial Resource Strain: Not on file  Food Insecurity: Not on file  Transportation Needs: Not on file  Physical Activity: Not on file  Stress: Not on file  Social Connections: Not on file  Intimate Partner Violence: Not on file   Family History  Problem Relation Age of Onset   Diabetes Mother    Hypertension Mother    Cancer Mother    Stroke Mother    Colon cancer Father    Hypertension Sister    Hypertension Sister    Hypertension Sister    Hypertension Sister    Hypertension Sister    Hypertension Sister    Hypertension Brother    Hypertension Brother       VITAL SIGNS BP [facility])  109/48   Pulse 72   Temp 97.7 F (36.5 C)   Resp 18   Ht '5\' 6"'$  (1.676 m)   Wt 123 lb 9.6 oz (56.1 kg)   SpO2 95%   BMI 19.95 kg/m   Outpatient Encounter Medications as of 09/05/2022  Medication Sig   acetaminophen (TYLENOL) 325 MG tablet Take 650 mg by mouth every 6 (six) hours as needed.   divalproex (DEPAKOTE) 500 MG DR tablet Take 1 tablet (500 mg total) by mouth 2 (two) times daily.   Ensure Plus (ENSURE PLUS) LIQD Take 237 mLs by mouth daily.   levETIRAcetam (KEPPRA) 100 MG/ML solution Take 750 mg by mouth 2 (two) times daily.   LORazepam (ATIVAN) 2 MG/ML injection Inject 0.5 mLs (1 mg total) into the muscle every 15 (fifteen) minutes as needed. Give 1 mg IM very 15 minutes as needed for up to 3 doses; if not relieved after 3rd dose send to the ED   melatonin  3 MG TABS tablet Take 3 mg by mouth at bedtime.   ondansetron (ZOFRAN-ODT) 8 MG disintegrating tablet Take 8 mg by mouth every 6 (six) hours as needed for nausea or vomiting. Before meals and at bedtime   sennosides-docusate sodium (SENOKOT-S) 8.6-50 MG tablet Take 1 tablet by mouth 2 (two) times daily.   UNABLE TO FIND Diet: Regular diet. Send small portions.   No facility-administered encounter medications on file as of 09/05/2022.     SIGNIFICANT DIAGNOSTIC EXAMS  PREVIOUS   04-09-22: right humerus x-ray:No acute fracture or dislocation of the humerus or forearm.   04-09-22: pelvic x-ray  No evidence of acute hip fracture. Moderate bilateral hip osteoarthritis.  04-09-22: ct of head:  1.  No acute intracranial process. 2.  Left parietal vertex scalp hematoma.  04-09-22: ct of cervical spine:  No evidence of acute cervical spine fracture or traumatic listhesis.   NO NEW EXAMS    LABS REVIEWED:   12-14-21; wbc 3.4; hgb 8.9; hct 29.2; plt 723; glucose 80; bun 20; creat 1.36; k+ 5.2; na++ 136; ca 8.7  12-18-21: wbc 4.3; hgb 8.7; hct 28.8; plt 710; glucose 83; bun 20; creat 1.36;  k+ 4.1; na++ 141; ca 8.8  01-10-22: wbc 4.5; hgb 8.0; hct 25.8; mcv 82.2 plt 603; glucose 76; bun 19; creat 1.28; k+ 3.6; na++ 1140; ca 8.5; gfr 46; protein 7.0; albumin 2.3; chol 98; ldl 48; trig 151; hdl 20; tsh 2.976 free t4: 1.00; iron 23; tibc 135 02-18-22: hgb 7.7; hct 24.7; glucose 47; bun 9; creat 0.80; k+ 3.0; na++ 136; ca 7.6; gfr>60; protein 5.8; albumin 1.9 02-19-22: wbc 3.2; hgb 8.3; hct 26.0; mcv 82.5 plt 141; k+ 2.6  02-20-22: k+ 4.7  03-05-22: keppra level 42.0 (10-40 normal) 03-26-22: wbc 4.2; hgb 8.2; hct 26.3; mcv 86.8 plt 508; glucose 78; bun 9; creat 0.84; k+ 2.4; na++ 136; ca 7.6; gfr>60; protein 6.5; albumin 2.1; tsh 4.826; sed rate: 82 ANA+ 03-28-22: k+ 4.0 03-31-22: RPR nr; HIV nr 04-09-22: wbc 6.3; hgb 9.2 hct 30.1 mcv 91.8 plt 440; glucose 101; bun 12; creat 1.17; k + 3.5; na++ 134; ca 8.2; gfr  51 04-25-22: glucose 75; bun 12; creat 0.92; k+ 2.6; na++ 142; ca 8.0; gfr >60;  protein 5.4; albumin 2.4. depakote 86; vitamin B 12: 421; folate 3.2; iron 33; tibc 127. 04-26-22: k+ 4.4   NO NEW LABS.   Review of Systems  Constitutional:  Negative for malaise/fatigue.  Respiratory:  Negative for cough and shortness  of breath.   Cardiovascular:  Negative for chest pain, palpitations and leg swelling.  Gastrointestinal:  Negative for abdominal pain, constipation and heartburn.  Musculoskeletal:  Negative for back pain, joint pain and myalgias.  Skin: Negative.   Neurological:  Negative for dizziness.  Psychiatric/Behavioral:  The patient is not nervous/anxious.    Physical Exam Constitutional:      General: Rebekah [name] is not in acute distress.    Appearance: Rebekah [name] is well-developed. Rebekah [name] is not diaphoretic.  Neck:     Thyroid: No thyromegaly.  Cardiovascular:     Rate and Rhythm: Normal rate and regular rhythm.     Pulses: Normal pulses.     Heart sounds: Normal heart sounds.  Pulmonary:     Effort: Pulmonary effort is normal. No respiratory distress.     Breath sounds: Normal breath sounds.  Abdominal:     General: Bowel sounds are normal. There is no distension.     Palpations: Abdomen is soft.     Tenderness: There is no abdominal tenderness.  Musculoskeletal:        General: Normal range of motion.     Cervical back: Neck supple.     Right lower leg: No edema.     Left lower leg: No edema.  Lymphadenopathy:     Cervical: No cervical adenopathy.  Skin:    General: Skin is warm and dry.  Neurological:     Mental Status: Rebekah [name] is alert. Mental status is at baseline.  Psychiatric:        Mood and Affect: Mood normal.      ASSESSMENT/ PLAN:  TODAY  Protein calorie malnutrition severe: protein 6.5; albumin 2.1 will continue supplements as directed  2. Benign essential hypertension: b/p 109/48 is off norvasc and atenolol  3. Systemic lupus erythematous: is off steroids  4.  GERD without esophagitis: is off protonix     PREVIOUS   5. Chronic constipation: will continue senna s twice daily   6. Major depression recurrent chronic: is off zoloft weight is 123 pounds;   7. Chronic anemia: hgb 8.7 will monitor   8. Thrombocytosis: plt on 440   9. Hyperlipidemia LDL goal <70 ldl 48 is off lipitor  10. History of CVA: is stable is off asa; brilanta  11. Neurocognitive deficits will monitor   12. Chronic kidney disease (CKD) stage 4 (severe): bun 20; creat 1.36  13. Rheumatoid arthritis involving multiple sites with positive rheumatoid factor: is off steroids  14. Neurocognitive disorder: will monitor  15. Seizure as late effect cerebrovascular accident (CVA) no reports of recent activity will continue keppra 750 mg twice daily depakote 500 mg twice daily      Ok Edwards NP [facility]  call [phone]

## 2022-09-23 DIAGNOSIS — Z1152 Encounter for screening for COVID-19: Secondary | ICD-10-CM | POA: Diagnosis not present

## 2022-09-29 DIAGNOSIS — E039 Hypothyroidism, unspecified: Secondary | ICD-10-CM | POA: Diagnosis not present

## 2022-09-29 DIAGNOSIS — I69398 Other sequelae of cerebral infarction: Secondary | ICD-10-CM | POA: Diagnosis not present

## 2022-09-29 DIAGNOSIS — D649 Anemia, unspecified: Secondary | ICD-10-CM | POA: Diagnosis not present

## 2022-09-29 DIAGNOSIS — I10 Essential (primary) hypertension: Secondary | ICD-10-CM | POA: Diagnosis not present

## 2022-10-03 ENCOUNTER — Non-Acute Institutional Stay (SKILLED_NURSING_FACILITY): Payer: Medicare Other | Admitting: Adult Health

## 2022-10-03 ENCOUNTER — Encounter: Payer: Self-pay | Admitting: Adult Health

## 2022-10-03 DIAGNOSIS — K5909 Other constipation: Secondary | ICD-10-CM

## 2022-10-03 DIAGNOSIS — D649 Anemia, unspecified: Secondary | ICD-10-CM | POA: Diagnosis not present

## 2022-10-03 DIAGNOSIS — D75839 Thrombocytosis, unspecified: Secondary | ICD-10-CM | POA: Diagnosis not present

## 2022-10-03 DIAGNOSIS — F339 Major depressive disorder, recurrent, unspecified: Secondary | ICD-10-CM | POA: Diagnosis not present

## 2022-10-03 NOTE — Progress Notes (Unsigned)
Location:  [place] Room Number: 157-W Place of Service:  SNF (31)   CODE STATUS: DNR  No Known Allergies  Chief Complaint  Patient presents with   Medical Management of Chronic Issues                Chronic constipation: Major depression recurrent chronic: Chronic anemia: Thrombocytosis    HPI:  She is a 67 year old long term resident of this facility being seen for the management of her chronic illnesses: Chronic constipation: Major depression recurrent chronic: Chronic anemia: Thrombocytosis. She continues to be followed by hospice care. She is gaining weight to 126 pounds. She is getting out of her room on some rare occasions; was wheeling in hallway.   Past Medical History:  Diagnosis Date   Arthritis    Rheumatoid   Dysphagia    post CVA   Hypertension    Lupus (systemic lupus erythematosus) ([place])    Seizure ([place])    Stroke [facility])     Past Surgical History:  Procedure Laterality Date   BIOPSY  02/21/2022   Procedure: BIOPSY;  Surgeon: [name], DO;  [facility];  Service: Endoscopy;;   CESAREAN SECTION     COLONOSCOPY WITH PROPOFOL N/A 02/21/2022   Procedure: COLONOSCOPY WITH PROPOFOL;  Surgeon: [name], DO;  [facility];  Service: Endoscopy;  Laterality: N/A;  2:00pm   ESOPHAGEAL BRUSHING  02/21/2022   Procedure: ESOPHAGEAL BRUSHING;  Surgeon: [name], DO;  [facility];  Service: Endoscopy;;   ESOPHAGOGASTRODUODENOSCOPY (EGD) WITH PROPOFOL N/A 02/21/2022   Procedure: ESOPHAGOGASTRODUODENOSCOPY (EGD) WITH PROPOFOL;  Surgeon: [name], DO;  [facility];  Service: Endoscopy;  Laterality: N/A;   POLYPECTOMY  02/21/2022   Procedure: POLYPECTOMY;  Surgeon: [name], DO;  [facility];  Service: Endoscopy;;    Social History   Socioeconomic History   Marital status: Single    Spouse name: Not on file   Number of children: Not on file   Years of  education: Not on file   Highest education level: Not on file  Occupational History   Not on file  Tobacco Use   Smoking status: Never   Smokeless tobacco: Never  Vaping Use   Vaping Use: Never used  Substance and Sexual Activity   Alcohol use: No    Alcohol/week: 0.0 standard drinks of alcohol   Drug use: No   Sexual activity: Not on file  Other Topics Concern   Not on file  Social History Narrative   Lives with sister.     Social Determinants of Health   Financial Resource Strain: Not on file  Food Insecurity: Not on file  Transportation Needs: Not on file  Physical Activity: Not on file  Stress: Not on file  Social Connections: Not on file  Intimate Partner Violence: Not on file   Family History  Problem Relation Age of Onset   Diabetes Mother    Hypertension Mother    Cancer Mother    Stroke Mother    Colon cancer Father    Hypertension Sister    Hypertension Sister    Hypertension Sister    Hypertension Sister    Hypertension Sister    Hypertension Sister    Hypertension Brother    Hypertension Brother       VITAL SIGNS BP 112/72   Pulse 60   Temp 97.7 F (36.5 C)  Resp 20   Ht '5\' 6"'$  (1.676 m)   Wt 126 lb (57.2 kg)   SpO2 98%   BMI 20.34 kg/m   Outpatient Encounter Medications as of 10/03/2022  Medication Sig   acetaminophen (TYLENOL) 325 MG tablet Take 650 mg by mouth every 6 (six) hours as needed.   divalproex (DEPAKOTE) 500 MG DR tablet Take 1 tablet (500 mg total) by mouth 2 (two) times daily.   Ensure Plus (ENSURE PLUS) LIQD Take 237 mLs by mouth daily.   levETIRAcetam (KEPPRA) 100 MG/ML solution Take 750 mg by mouth 2 (two) times daily.   LORazepam (ATIVAN) 2 MG/ML injection Inject 0.5 mLs (1 mg total) into the muscle every 15 (fifteen) minutes as needed. Give 1 mg IM very 15 minutes as needed for up to 3 doses; if not relieved after 3rd dose send to the ED   melatonin 3 MG TABS tablet Take 3 mg by mouth at bedtime.   ondansetron  (ZOFRAN-ODT) 8 MG disintegrating tablet Take 8 mg by mouth every 6 (six) hours as needed for nausea or vomiting. Before meals and at bedtime   sennosides-docusate sodium (SENOKOT-S) 8.6-50 MG tablet Take 1 tablet by mouth 2 (two) times daily.   UNABLE TO FIND Diet: Regular diet. Send small portions.   No facility-administered encounter medications on file as of 10/03/2022.     SIGNIFICANT DIAGNOSTIC EXAMS  PREVIOUS   04-09-22: right humerus x-ray:No acute fracture or dislocation of the humerus or forearm.   04-09-22: pelvic x-ray  No evidence of acute hip fracture. Moderate bilateral hip osteoarthritis.  04-09-22: ct of head:  1.  No acute intracranial process. 2.  Left parietal vertex scalp hematoma.  04-09-22: ct of cervical spine:  No evidence of acute cervical spine fracture or traumatic listhesis.   NO NEW EXAMS    LABS REVIEWED:   12-14-21; wbc 3.4; hgb 8.9; hct 29.2; plt 723; glucose 80; bun 20; creat 1.36; k+ 5.2; na++ 136; ca 8.7  12-18-21: wbc 4.3; hgb 8.7; hct 28.8; plt 710; glucose 83; bun 20; creat 1.36;  k+ 4.1; na++ 141; ca 8.8  01-10-22: wbc 4.5; hgb 8.0; hct 25.8; mcv 82.2 plt 603; glucose 76; bun 19; creat 1.28; k+ 3.6; na++ 1140; ca 8.5; gfr 46; protein 7.0; albumin 2.3; chol 98; ldl 48; trig 151; hdl 20; tsh 2.976 free t4: 1.00; iron 23; tibc 135 02-18-22: hgb 7.7; hct 24.7; glucose 47; bun 9; creat 0.80; k+ 3.0; na++ 136; ca 7.6; gfr>60; protein 5.8; albumin 1.9 02-19-22: wbc 3.2; hgb 8.3; hct 26.0; mcv 82.5 plt 141; k+ 2.6  02-20-22: k+ 4.7  03-05-22: keppra level 42.0 (10-40 normal) 03-26-22: wbc 4.2; hgb 8.2; hct 26.3; mcv 86.8 plt 508; glucose 78; bun 9; creat 0.84; k+ 2.4; na++ 136; ca 7.6; gfr>60; protein 6.5; albumin 2.1; tsh 4.826; sed rate: 82 ANA+ 03-28-22: k+ 4.0 03-31-22: RPR nr; HIV nr 04-09-22: wbc 6.3; hgb 9.2 hct 30.1 mcv 91.8 plt 440; glucose 101; bun 12; creat 1.17; k + 3.5; na++ 134; ca 8.2; gfr 51 04-25-22: glucose 75; bun 12; creat 0.92; k+ 2.6; na++ 142;  ca 8.0; gfr >60;  protein 5.4; albumin 2.4. depakote 86; vitamin B 12: 421; folate 3.2; iron 33; tibc 127. 04-26-22: k+ 4.4   NO NEW LABS.   Review of Systems  Constitutional:  Negative for malaise/fatigue.  Respiratory:  Negative for cough and shortness of breath.   Cardiovascular:  Negative for chest pain, palpitations and leg swelling.  Gastrointestinal:  Negative for abdominal pain, constipation and heartburn.  Musculoskeletal:  Negative for back pain, joint pain and myalgias.  Skin: Negative.   Neurological:  Negative for dizziness.  Psychiatric/Behavioral:  The patient is not nervous/anxious.    Physical Exam Constitutional:      General: She is not in acute distress.    Appearance: She is well-developed. She is not diaphoretic.  Neck:     Thyroid: No thyromegaly.  Cardiovascular:     Rate and Rhythm: Normal rate and regular rhythm.     Pulses: Normal pulses.     Heart sounds: Normal heart sounds.  Pulmonary:     Effort: Pulmonary effort is normal. No respiratory distress.     Breath sounds: Normal breath sounds.  Abdominal:     General: Bowel sounds are normal. There is no distension.     Palpations: Abdomen is soft.     Tenderness: There is no abdominal tenderness.  Musculoskeletal:     Cervical back: Neck supple.     Right lower leg: No edema.     Left lower leg: No edema.     Comments: Able to move all extremities   Lymphadenopathy:     Cervical: No cervical adenopathy.  Skin:    General: Skin is warm and dry.  Neurological:     Mental Status: She is alert and oriented to person, place, and time.  Psychiatric:        Mood and Affect: Mood normal.     ASSESSMENT/ PLAN:  TODAY  Chronic constipation: will continue senna s twice daily   2. Major depression recurrent chronic: is off zoloft weight is 126 pounds.   3. Chronic anemia: hgb 8.7  4. Thrombocytosis: plt 440     PREVIOUS   5. Hyperlipidemia LDL goal <70 ldl 48 is off lipitor  6. History of  CVA: is stable is off asa; brilanta  7. Neurocognitive deficits will monitor   8. Chronic kidney disease (CKD) stage 4 (severe): bun 20; creat 1.36  9. Rheumatoid arthritis involving multiple sites with positive rheumatoid factor: is off steroids  10. Neurocognitive disorder: will monitor  11. 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 Protein calorie malnutrition severe: protein 6.5; albumin 2.1 will continue supplements as directed  13. Benign essential hypertension: b/p 112/72 is off norvasc and atenolol  14. Systemic lupus erythematous: is off steroids  15. GERD without esophagitis: is off protonix    Ok Edwards NP [facility]  call [phone]

## 2022-11-04 ENCOUNTER — Non-Acute Institutional Stay (SKILLED_NURSING_FACILITY): Payer: Medicare Other | Admitting: Adult Health

## 2022-11-04 ENCOUNTER — Encounter: Payer: Self-pay | Admitting: Adult Health

## 2022-11-04 DIAGNOSIS — R29818 Other symptoms and signs involving the nervous system: Secondary | ICD-10-CM

## 2022-11-04 DIAGNOSIS — R4189 Other symptoms and signs involving cognitive functions and awareness: Secondary | ICD-10-CM

## 2022-11-04 DIAGNOSIS — Z8673 Personal history of transient ischemic attack (TIA), and cerebral infarction without residual deficits: Secondary | ICD-10-CM | POA: Diagnosis not present

## 2022-11-04 DIAGNOSIS — E785 Hyperlipidemia, unspecified: Secondary | ICD-10-CM | POA: Diagnosis not present

## 2022-11-04 NOTE — Progress Notes (Signed)
Location:  [place] Room Number: [id number] Place of Service:  SNF (31)   CODE STATUS: dnr   No Known Allergies  Chief Complaint  Patient presents with   Medical Management of Chronic Issues                 Hyperlipidemia LDL goal < 70: History of CVA:  Neurocognitive deficits     HPI:  She is a 67 year old long term resident of this facility being seen for the management of her chronic illnesses: Hyperlipidemia LDL goal < 70: History of CVA:  Neurocognitive deficits. There are no reports of uncontrolled pain. She is getting out of bed daily; is wheeling around the facility. She is no longer followed by hospice care.   Past Medical History:  Diagnosis Date   Arthritis    Rheumatoid   Dysphagia    post CVA   Hypertension    Lupus (systemic lupus erythematosus) ([place])    Seizure ([place])    Stroke [facility])     Past Surgical History:  Procedure Laterality Date   BIOPSY  02/21/2022   Procedure: BIOPSY;  Surgeon: [name], DO;  [facility];  Service: Endoscopy;;   CESAREAN SECTION     COLONOSCOPY WITH PROPOFOL N/A 02/21/2022   Procedure: COLONOSCOPY WITH PROPOFOL;  Surgeon: [name], DO;  [facility];  Service: Endoscopy;  Laterality: N/A;  2:00pm   ESOPHAGEAL BRUSHING  02/21/2022   Procedure: ESOPHAGEAL BRUSHING;  Surgeon: [name], DO;  [facility];  Service: Endoscopy;;   ESOPHAGOGASTRODUODENOSCOPY (EGD) WITH PROPOFOL N/A 02/21/2022   Procedure: ESOPHAGOGASTRODUODENOSCOPY (EGD) WITH PROPOFOL;  Surgeon: [name], DO;  [facility];  Service: Endoscopy;  Laterality: N/A;   POLYPECTOMY  02/21/2022   Procedure: POLYPECTOMY;  Surgeon: [name], DO;  [facility];  Service: Endoscopy;;    Social History   Socioeconomic History   Marital status: Single    Spouse name: Not on file   Number of children: Not on file   Years of education: Not on file   Highest education  level: Not on file  Occupational History   Not on file  Tobacco Use   Smoking status: Never   Smokeless tobacco: Never  Vaping Use   Vaping Use: Never used  Substance and Sexual Activity   Alcohol use: No    Alcohol/week: 0.0 standard drinks of alcohol   Drug use: No   Sexual activity: Not on file  Other Topics Concern   Not on file  Social History Narrative   Lives with sister.     Social Determinants of Health   Financial Resource Strain: Not on file  Food Insecurity: Not on file  Transportation Needs: Not on file  Physical Activity: Not on file  Stress: Not on file  Social Connections: Not on file  Intimate Partner Violence: Not on file   Family History  Problem Relation Age of Onset   Diabetes Mother    Hypertension Mother    Cancer Mother    Stroke Mother    Colon cancer Father    Hypertension Sister    Hypertension Sister    Hypertension Sister    Hypertension Sister    Hypertension Sister    Hypertension Sister    Hypertension Brother    Hypertension Brother       VITAL SIGNS BP 139/70   Pulse 70   Temp 98.3  F (36.8 C)   Resp 16   Ht '5\' 6"'$  (1.676 m)   Wt 134 lb 3.2 oz (60.9 kg)   SpO2 100%   BMI 21.66 kg/m   Outpatient Encounter Medications as of 11/04/2022  Medication Sig   acetaminophen (TYLENOL) 325 MG tablet Take 650 mg by mouth every 6 (six) hours as needed.   divalproex (DEPAKOTE) 500 MG DR tablet Take 1 tablet (500 mg total) by mouth 2 (two) times daily.   Ensure Plus (ENSURE PLUS) LIQD Take 237 mLs by mouth daily.   levETIRAcetam (KEPPRA) 100 MG/ML solution Take 750 mg by mouth 2 (two) times daily.   LORazepam (ATIVAN) 2 MG/ML injection Inject 0.5 mLs (1 mg total) into the muscle every 15 (fifteen) minutes as needed. Give 1 mg IM very 15 minutes as needed for up to 3 doses; if not relieved after 3rd dose send to the ED   melatonin 3 MG TABS tablet Take 3 mg by mouth at bedtime.   ondansetron (ZOFRAN-ODT) 8 MG disintegrating tablet Take  8 mg by mouth every 6 (six) hours as needed for nausea or vomiting. Before meals and at bedtime   sennosides-docusate sodium (SENOKOT-S) 8.6-50 MG tablet Take 1 tablet by mouth 2 (two) times daily.   UNABLE TO FIND Diet: Regular diet. Send small portions.   No facility-administered encounter medications on file as of 11/04/2022.     SIGNIFICANT DIAGNOSTIC EXAMS  PREVIOUS   04-09-22: right humerus x-ray:No acute fracture or dislocation of the humerus or forearm.   04-09-22: pelvic x-ray  No evidence of acute hip fracture. Moderate bilateral hip osteoarthritis.  04-09-22: ct of head:  1.  No acute intracranial process. 2.  Left parietal vertex scalp hematoma.  04-09-22: ct of cervical spine:  No evidence of acute cervical spine fracture or traumatic listhesis.   NO NEW EXAMS    LABS REVIEWED:   12-14-21; wbc 3.4; hgb 8.9; hct 29.2; plt 723; glucose 80; bun 20; creat 1.36; k+ 5.2; na++ 136; ca 8.7  12-18-21: wbc 4.3; hgb 8.7; hct 28.8; plt 710; glucose 83; bun 20; creat 1.36;  k+ 4.1; na++ 141; ca 8.8  01-10-22: wbc 4.5; hgb 8.0; hct 25.8; mcv 82.2 plt 603; glucose 76; bun 19; creat 1.28; k+ 3.6; na++ 1140; ca 8.5; gfr 46; protein 7.0; albumin 2.3; chol 98; ldl 48; trig 151; hdl 20; tsh 2.976 free t4: 1.00; iron 23; tibc 135 02-18-22: hgb 7.7; hct 24.7; glucose 47; bun 9; creat 0.80; k+ 3.0; na++ 136; ca 7.6; gfr>60; protein 5.8; albumin 1.9 02-19-22: wbc 3.2; hgb 8.3; hct 26.0; mcv 82.5 plt 141; k+ 2.6  02-20-22: k+ 4.7  03-05-22: keppra level 42.0 (10-40 normal) 03-26-22: wbc 4.2; hgb 8.2; hct 26.3; mcv 86.8 plt 508; glucose 78; bun 9; creat 0.84; k+ 2.4; na++ 136; ca 7.6; gfr>60; protein 6.5; albumin 2.1; tsh 4.826; sed rate: 82 ANA+ 03-28-22: k+ 4.0 03-31-22: RPR nr; HIV nr 04-09-22: wbc 6.3; hgb 9.2 hct 30.1 mcv 91.8 plt 440; glucose 101; bun 12; creat 1.17; k + 3.5; na++ 134; ca 8.2; gfr 51 04-25-22: glucose 75; bun 12; creat 0.92; k+ 2.6; na++ 142; ca 8.0; gfr >60;  protein 5.4; albumin 2.4.  depakote 86; vitamin B 12: 421; folate 3.2; iron 33; tibc 127. 04-26-22: k+ 4.4   NO NEW LABS.   Review of Systems  Constitutional:  Negative for malaise/fatigue.  Respiratory:  Negative for cough and shortness of breath.   Cardiovascular:  Negative for chest  pain, palpitations and leg swelling.  Gastrointestinal:  Negative for abdominal pain, constipation and heartburn.  Musculoskeletal:  Negative for back pain, joint pain and myalgias.  Skin: Negative.   Neurological:  Negative for dizziness.  Psychiatric/Behavioral:  The patient is not nervous/anxious.    Physical Exam Constitutional:      General: She is not in acute distress.    Appearance: She is well-developed. She is not diaphoretic.  Neck:     Thyroid: No thyromegaly.  Cardiovascular:     Rate and Rhythm: Normal rate and regular rhythm.     Heart sounds: Normal heart sounds.  Pulmonary:     Effort: Pulmonary effort is normal. No respiratory distress.     Breath sounds: Normal breath sounds.  Abdominal:     General: Bowel sounds are normal. There is no distension.     Palpations: Abdomen is soft.     Tenderness: There is no abdominal tenderness.  Musculoskeletal:        General: Normal range of motion.     Cervical back: Neck supple.     Right lower leg: No edema.     Left lower leg: No edema.     Comments: Kyphosis   Lymphadenopathy:     Cervical: No cervical adenopathy.  Skin:    General: Skin is warm and dry.  Neurological:     Mental Status: She is alert and oriented to person, place, and time.  Psychiatric:        Mood and Affect: Mood normal.      ASSESSMENT/ PLAN:  TODAY  Hyperlipidemia LDL goal < 70: is off lipitor   2. History of CVA: is off asa and brilanta  3. Neurocognitive deficits: will monitor   PREVIOUS   4. Chronic kidney disease (CKD) stage 4 (severe): bun 20; creat 1.36  5. Rheumatoid arthritis involving multiple sites with positive rheumatoid factor: is off steroids  6.  Neurocognitive disorder: will monitor  7. 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 Protein calorie malnutrition severe: protein 6.5; albumin 2.1 will continue supplements as directed  9. Benign essential hypertension: b/p 139/70 is off norvasc and atenolol  10. Systemic lupus erythematous: is off steroids  11. GERD without esophagitis: is off protonix   12. Chronic constipation: will continue senna s twice daily   13. Major depression recurrent chronic: is off zoloft weight is 134 pounds.   14. Chronic anemia: hgb 8.7  15. Thrombocytosis: plt 440    Will check the following: cbc; cmp; lipids; tsh free t4; iron keppra level; depakote level; iron hepatitis C. Will get dexa scan    Ok Edwards NP [facility]  call [phone]

## 2022-11-05 DIAGNOSIS — M6281 Muscle weakness (generalized): Secondary | ICD-10-CM | POA: Diagnosis not present

## 2022-11-05 DIAGNOSIS — R2681 Unsteadiness on feet: Secondary | ICD-10-CM | POA: Diagnosis not present

## 2022-11-05 DIAGNOSIS — R569 Unspecified convulsions: Secondary | ICD-10-CM | POA: Diagnosis not present

## 2022-11-05 DIAGNOSIS — M3210 Systemic lupus erythematosus, organ or system involvement unspecified: Secondary | ICD-10-CM | POA: Diagnosis not present

## 2022-11-05 DIAGNOSIS — R262 Difficulty in walking, not elsewhere classified: Secondary | ICD-10-CM | POA: Diagnosis not present

## 2022-11-05 DIAGNOSIS — R627 Adult failure to thrive: Secondary | ICD-10-CM | POA: Diagnosis not present

## 2022-11-05 DIAGNOSIS — I639 Cerebral infarction, unspecified: Secondary | ICD-10-CM | POA: Diagnosis not present

## 2022-11-05 DIAGNOSIS — R488 Other symbolic dysfunctions: Secondary | ICD-10-CM | POA: Diagnosis not present

## 2022-11-05 DIAGNOSIS — I69398 Other sequelae of cerebral infarction: Secondary | ICD-10-CM | POA: Diagnosis not present

## 2022-11-06 DIAGNOSIS — R488 Other symbolic dysfunctions: Secondary | ICD-10-CM | POA: Diagnosis not present

## 2022-11-06 DIAGNOSIS — I639 Cerebral infarction, unspecified: Secondary | ICD-10-CM | POA: Diagnosis not present

## 2022-11-06 DIAGNOSIS — R2681 Unsteadiness on feet: Secondary | ICD-10-CM | POA: Diagnosis not present

## 2022-11-06 DIAGNOSIS — M3210 Systemic lupus erythematosus, organ or system involvement unspecified: Secondary | ICD-10-CM | POA: Diagnosis not present

## 2022-11-06 DIAGNOSIS — R627 Adult failure to thrive: Secondary | ICD-10-CM | POA: Diagnosis not present

## 2022-11-06 DIAGNOSIS — M6281 Muscle weakness (generalized): Secondary | ICD-10-CM | POA: Diagnosis not present

## 2022-11-06 DIAGNOSIS — R569 Unspecified convulsions: Secondary | ICD-10-CM | POA: Diagnosis not present

## 2022-11-06 DIAGNOSIS — I69398 Other sequelae of cerebral infarction: Secondary | ICD-10-CM | POA: Diagnosis not present

## 2022-11-06 DIAGNOSIS — R262 Difficulty in walking, not elsewhere classified: Secondary | ICD-10-CM | POA: Diagnosis not present

## 2022-11-07 ENCOUNTER — Other Ambulatory Visit (HOSPITAL_COMMUNITY)
Admission: RE | Admit: 2022-11-07 | Discharge: 2022-11-07 | Disposition: A | Discharge status: Home / Self Care | Payer: Medicare Other | Source: Skilled Nursing Facility | Attending: Adult Health | Admitting: Adult Health

## 2022-11-07 ENCOUNTER — Non-Acute Institutional Stay (SKILLED_NURSING_FACILITY): Payer: Medicare Other | Admitting: Adult Health

## 2022-11-07 ENCOUNTER — Encounter: Payer: Self-pay | Admitting: Adult Health

## 2022-11-07 DIAGNOSIS — R262 Difficulty in walking, not elsewhere classified: Secondary | ICD-10-CM | POA: Diagnosis not present

## 2022-11-07 DIAGNOSIS — N184 Chronic kidney disease, stage 4 (severe): Secondary | ICD-10-CM | POA: Diagnosis not present

## 2022-11-07 DIAGNOSIS — R488 Other symbolic dysfunctions: Secondary | ICD-10-CM | POA: Diagnosis not present

## 2022-11-07 DIAGNOSIS — I69398 Other sequelae of cerebral infarction: Secondary | ICD-10-CM | POA: Diagnosis not present

## 2022-11-07 DIAGNOSIS — D649 Anemia, unspecified: Secondary | ICD-10-CM

## 2022-11-07 DIAGNOSIS — R569 Unspecified convulsions: Secondary | ICD-10-CM | POA: Insufficient documentation

## 2022-11-07 DIAGNOSIS — E039 Hypothyroidism, unspecified: Secondary | ICD-10-CM | POA: Diagnosis not present

## 2022-11-07 DIAGNOSIS — R2681 Unsteadiness on feet: Secondary | ICD-10-CM | POA: Diagnosis not present

## 2022-11-07 DIAGNOSIS — E785 Hyperlipidemia, unspecified: Secondary | ICD-10-CM | POA: Diagnosis not present

## 2022-11-07 DIAGNOSIS — M6281 Muscle weakness (generalized): Secondary | ICD-10-CM | POA: Diagnosis not present

## 2022-11-07 DIAGNOSIS — M3210 Systemic lupus erythematosus, organ or system involvement unspecified: Secondary | ICD-10-CM | POA: Diagnosis not present

## 2022-11-07 DIAGNOSIS — I639 Cerebral infarction, unspecified: Secondary | ICD-10-CM | POA: Diagnosis not present

## 2022-11-07 DIAGNOSIS — E43 Unspecified severe protein-calorie malnutrition: Secondary | ICD-10-CM

## 2022-11-07 DIAGNOSIS — R627 Adult failure to thrive: Secondary | ICD-10-CM | POA: Diagnosis not present

## 2022-11-07 LAB — LIPID PANEL
Cholesterol: 172 mg/dL (ref 0–200)
HDL: 35 mg/dL — ABNORMAL LOW (ref >40)
LDL Cholesterol: 125 mg/dL — ABNORMAL HIGH (ref 0–99)
Total CHOL/HDL Ratio: 4.9 RATIO
Triglycerides: 62 mg/dL (ref <150)
VLDL: 12 mg/dL (ref 0–40)

## 2022-11-07 LAB — CBC WITH DIFFERENTIAL/PLATELET
Abs Immature Granulocytes: 0.03 10*3/uL (ref 0.00–0.07)
Basophils Absolute: 0 10*3/uL (ref 0.0–0.1)
Basophils Relative: 1 %
Eosinophils Absolute: 0 10*3/uL (ref 0.0–0.5)
Eosinophils Relative: 1 %
HCT: 31.4 % — ABNORMAL LOW (ref 36.0–46.0)
Hemoglobin: 10.1 g/dL — ABNORMAL LOW (ref 12.0–15.0)
Immature Granulocytes: 1 %
Lymphocytes Relative: 44 %
Lymphs Abs: 1.3 10*3/uL (ref 0.7–4.0)
MCH: 32.8 pg (ref 26.0–34.0)
MCHC: 32.2 g/dL (ref 30.0–36.0)
MCV: 101.9 fL — ABNORMAL HIGH (ref 80.0–100.0)
Monocytes Absolute: 0.2 10*3/uL (ref 0.1–1.0)
Monocytes Relative: 8 %
Neutro Abs: 1.3 10*3/uL — ABNORMAL LOW (ref 1.7–7.7)
Neutrophils Relative %: 45 %
Platelets: 213 10*3/uL (ref 150–400)
RBC: 3.08 MIL/uL — ABNORMAL LOW (ref 3.87–5.11)
RDW: 13.5 % (ref 11.5–15.5)
WBC: 2.9 10*3/uL — ABNORMAL LOW (ref 4.0–10.5)
nRBC: 0 % (ref 0.0–0.2)

## 2022-11-07 LAB — COMPREHENSIVE METABOLIC PANEL
ALT: 6 U/L (ref 0–44)
AST: 13 U/L — ABNORMAL LOW (ref 15–41)
Albumin: 2.8 g/dL — ABNORMAL LOW (ref 3.5–5.0)
Alkaline Phosphatase: 52 U/L (ref 38–126)
Anion gap: 7 (ref 5–15)
BUN: 25 mg/dL — ABNORMAL HIGH (ref 8–23)
CO2: 24 mmol/L (ref 22–32)
Calcium: 8.5 mg/dL — ABNORMAL LOW (ref 8.9–10.3)
Chloride: 106 mmol/L (ref 98–111)
Creatinine, Ser: 1.37 mg/dL — ABNORMAL HIGH (ref 0.44–1.00)
GFR, Estimated: 42 mL/min — ABNORMAL LOW (ref >60)
Glucose, Bld: 77 mg/dL (ref 70–99)
Potassium: 3.8 mmol/L (ref 3.5–5.1)
Sodium: 137 mmol/L (ref 135–145)
Total Bilirubin: <0.1 mg/dL — ABNORMAL LOW (ref 0.3–1.2)
Total Protein: 6.9 g/dL (ref 6.5–8.1)

## 2022-11-07 LAB — T4, FREE: Free T4: 0.75 ng/dL (ref 0.61–1.12)

## 2022-11-07 LAB — IRON AND TIBC
Iron: 54 ug/dL (ref 28–170)
Saturation Ratios: 23 % (ref 10.4–31.8)
TIBC: 235 ug/dL — ABNORMAL LOW (ref 250–450)
UIBC: 181 ug/dL

## 2022-11-07 LAB — TSH: TSH: 8.057 u[IU]/mL — ABNORMAL HIGH (ref 0.350–4.500)

## 2022-11-07 LAB — VALPROIC ACID LEVEL: Valproic Acid Lvl: 71 ug/mL (ref 50.0–100.0)

## 2022-11-07 LAB — HEPATITIS C ANTIBODY: HCV Ab: NONREACTIVE

## 2022-11-07 NOTE — Progress Notes (Signed)
Location:  [place] Room Number: 157 Place of Service:  SNF (31)   CODE STATUS: DNR   No Known Allergies  Chief Complaint  Patient presents with   Acute Visit    Follow-up on labs    HPI:  She has recently stopped being seen by hospice care. She has had a routine set of blood work performed. Her tsh is elevated at 8.057; an albumin of 2.8; with a slightly elevated creatinine. She is getting out of bed daily; does participate in therapy   Past Medical History:  Diagnosis Date   Arthritis    Rheumatoid   Dysphagia    post CVA   Hypertension    Lupus (systemic lupus erythematosus) ([place])    Seizure ([place])    Stroke [facility])     Past Surgical History:  Procedure Laterality Date   BIOPSY  02/21/2022   Procedure: BIOPSY;  Surgeon: [name], DO;  [facility];  Service: Endoscopy;;   CESAREAN SECTION     COLONOSCOPY WITH PROPOFOL N/A 02/21/2022   Procedure: COLONOSCOPY WITH PROPOFOL;  Surgeon: [name], DO;  [facility];  Service: Endoscopy;  Laterality: N/A;  2:00pm   ESOPHAGEAL BRUSHING  02/21/2022   Procedure: ESOPHAGEAL BRUSHING;  Surgeon: [name], DO;  [facility]: [facility];  Service: Endoscopy;;   ESOPHAGOGASTRODUODENOSCOPY (EGD) WITH PROPOFOL N/A 02/21/2022   Procedure: ESOPHAGOGASTRODUODENOSCOPY (EGD) WITH PROPOFOL;  Surgeon: [name], DO;  [facility];  Service: Endoscopy;  Laterality: N/A;   POLYPECTOMY  02/21/2022   Procedure: POLYPECTOMY;  Surgeon: [name], DO;  [facility];  Service: Endoscopy;;    Social History   Socioeconomic History   Marital status: Single    Spouse name: Not on file   Number of children: Not on file   Years of education: Not on file   Highest education level: Not on file  Occupational History   Not on file  Tobacco Use   Smoking status: Never   Smokeless tobacco: Never  Vaping Use   Vaping Use: Never used  Substance and  Sexual Activity   Alcohol use: No    Alcohol/week: 0.0 standard drinks of alcohol   Drug use: No   Sexual activity: Not on file  Other Topics Concern   Not on file  Social History Narrative   Lives with sister.     Social Determinants of Health   Financial Resource Strain: Not on file  Food Insecurity: Not on file  Transportation Needs: Not on file  Physical Activity: Not on file  Stress: Not on file  Social Connections: Not on file  Intimate Partner Violence: Not on file   Family History  Problem Relation Age of Onset   Diabetes Mother    Hypertension Mother    Cancer Mother    Stroke Mother    Colon cancer Father    Hypertension Sister    Hypertension Sister    Hypertension Sister    Hypertension Sister    Hypertension Sister    Hypertension Sister    Hypertension Brother    Hypertension Brother       VITAL SIGNS BP (!) 144/70   Pulse 70   Temp 98.3 F (36.8 C)   Resp 16   Ht '5\' 6"'$  (1.676 m)   Wt 134 lb 2 oz (60.8 kg)   SpO2 100%   BMI 21.65 kg/m   Outpatient Encounter Medications as of  11/07/2022  Medication Sig   acetaminophen (TYLENOL) 325 MG tablet Take 650 mg by mouth every 6 (six) hours as needed.   Amino Acids-Protein Hydrolys (FEEDING SUPPLEMENT, PRO-STAT SUGAR FREE 64,) LIQD Take 30 mLs by mouth 3 (three) times daily with meals.   divalproex (DEPAKOTE) 500 MG DR tablet Take 1 tablet (500 mg total) by mouth 2 (two) times daily.   Ensure Plus (ENSURE PLUS) LIQD Take 237 mLs by mouth daily.   levETIRAcetam (KEPPRA) 100 MG/ML solution Take 750 mg by mouth 2 (two) times daily.   levothyroxine (SYNTHROID) 100 MCG tablet Take 100 mcg by mouth daily before breakfast.   LORazepam (ATIVAN) 2 MG/ML injection Inject 0.5 mLs (1 mg total) into the muscle every 15 (fifteen) minutes as needed. Give 1 mg IM very 15 minutes as needed for up to 3 doses; if not relieved after 3rd dose send to the ED   melatonin 3 MG TABS tablet Take 3 mg by mouth at bedtime.    ondansetron (ZOFRAN-ODT) 8 MG disintegrating tablet Take 8 mg by mouth every 6 (six) hours as needed for nausea or vomiting. Before meals and at bedtime   sennosides-docusate sodium (SENOKOT-S) 8.6-50 MG tablet Take 1 tablet by mouth 2 (two) times daily.   UNABLE TO FIND Diet: Regular diet. Send small portions.   No facility-administered encounter medications on file as of 11/07/2022.     SIGNIFICANT DIAGNOSTIC EXAMS  PREVIOUS   04-09-22: right humerus x-ray:No acute fracture or dislocation of the humerus or forearm.   04-09-22: pelvic x-ray  No evidence of acute hip fracture. Moderate bilateral hip osteoarthritis.  04-09-22: ct of head:  1.  No acute intracranial process. 2.  Left parietal vertex scalp hematoma.  04-09-22: ct of cervical spine:  No evidence of acute cervical spine fracture or traumatic listhesis.   NO NEW EXAMS    LABS REVIEWED:   12-14-21; wbc 3.4; hgb 8.9; hct 29.2; plt 723; glucose 80; bun 20; creat 1.36; k+ 5.2; na++ 136; ca 8.7  12-18-21: wbc 4.3; hgb 8.7; hct 28.8; plt 710; glucose 83; bun 20; creat 1.36;  k+ 4.1; na++ 141; ca 8.8  01-10-22: wbc 4.5; hgb 8.0; hct 25.8; mcv 82.2 plt 603; glucose 76; bun 19; creat 1.28; k+ 3.6; na++ 1140; ca 8.5; gfr 46; protein 7.0; albumin 2.3; chol 98; ldl 48; trig 151; hdl 20; tsh 2.976 free t4: 1.00; iron 23; tibc 135 02-18-22: hgb 7.7; hct 24.7; glucose 47; bun 9; creat 0.80; k+ 3.0; na++ 136; ca 7.6; gfr>60; protein 5.8; albumin 1.9 02-19-22: wbc 3.2; hgb 8.3; hct 26.0; mcv 82.5 plt 141; k+ 2.6  02-20-22: k+ 4.7  03-05-22: keppra level 42.0 (10-40 normal) 03-26-22: wbc 4.2; hgb 8.2; hct 26.3; mcv 86.8 plt 508; glucose 78; bun 9; creat 0.84; k+ 2.4; na++ 136; ca 7.6; gfr>60; protein 6.5; albumin 2.1; tsh 4.826; sed rate: 82 ANA+ 03-28-22: k+ 4.0 03-31-22: RPR nr; HIV nr 04-09-22: wbc 6.3; hgb 9.2 hct 30.1 mcv 91.8 plt 440; glucose 101; bun 12; creat 1.17; k + 3.5; na++ 134; ca 8.2; gfr 51 04-25-22: glucose 75; bun 12; creat 0.92; k+  2.6; na++ 142; ca 8.0; gfr >60;  protein 5.4; albumin 2.4. depakote 86; vitamin B 12: 421; folate 3.2; iron 33; tibc 127. 04-26-22: k+ 4.4   TODAY  11-07-22: wbc 2.9; hgb 10.1; hct 31.4; mcv 101.9 plt 213; glucose 77; bun 25; creat 1.37; k+ 3.8; na++ 138; ca 8.5 gfr 42; protein 6.9 albumin 2.8; chol  172; ldl 125; trig 62; hdl 35; tsh 8.057; free t4: 0.75; depakote 71; iron 54; tibc 235     Review of Systems  Constitutional:  Negative for malaise/fatigue.  Respiratory:  Negative for cough and shortness of breath.   Cardiovascular:  Negative for chest pain, palpitations and leg swelling.  Gastrointestinal:  Negative for abdominal pain, constipation and heartburn.  Musculoskeletal:  Negative for back pain, joint pain and myalgias.  Skin: Negative.   Neurological:  Negative for dizziness.  Psychiatric/Behavioral:  The patient is not nervous/anxious.     Physical Exam Constitutional:      General: She is not in acute distress.    Appearance: She is well-developed. She is not diaphoretic.  Neck:     Thyroid: No thyromegaly.  Cardiovascular:     Rate and Rhythm: Normal rate and regular rhythm.     Pulses: Normal pulses.     Heart sounds: Normal heart sounds.  Pulmonary:     Effort: Pulmonary effort is normal. No respiratory distress.     Breath sounds: Normal breath sounds.  Abdominal:     General: Bowel sounds are normal. There is no distension.     Palpations: Abdomen is soft.     Tenderness: There is no abdominal tenderness.  Musculoskeletal:        General: Normal range of motion.     Cervical back: Neck supple.     Right lower leg: No edema.     Left lower leg: No edema.     Comments: Kyphosis   Lymphadenopathy:     Cervical: No cervical adenopathy.  Skin:    General: Skin is warm and dry.  Neurological:     Mental Status: She is alert and oriented to person, place, and time.  Psychiatric:        Mood and Affect: Mood normal.      ASSESSMENT/  PLAN:  TODAY  Acquired hypothyroidism will begin synthroid 100 mcg daily  Chronic kidney disease (CKD) stage IV: is slightly worse she has been educated to drink more fluids and has verbalized understanding Chronic anemia: macrocytic in nature will check vitamin B 12 Protein calorie malnutrition: will begin prostat 30 mL three times daily     Ok Edwards NP [facility]  call [phone]

## 2022-11-08 ENCOUNTER — Encounter: Payer: Self-pay | Admitting: Adult Health

## 2022-11-08 ENCOUNTER — Non-Acute Institutional Stay (SKILLED_NURSING_FACILITY): Payer: Medicare Other | Admitting: Adult Health

## 2022-11-08 DIAGNOSIS — I639 Cerebral infarction, unspecified: Secondary | ICD-10-CM | POA: Diagnosis not present

## 2022-11-08 DIAGNOSIS — R569 Unspecified convulsions: Secondary | ICD-10-CM | POA: Diagnosis not present

## 2022-11-08 DIAGNOSIS — R488 Other symbolic dysfunctions: Secondary | ICD-10-CM | POA: Diagnosis not present

## 2022-11-08 DIAGNOSIS — N184 Chronic kidney disease, stage 4 (severe): Secondary | ICD-10-CM | POA: Diagnosis not present

## 2022-11-08 DIAGNOSIS — M6281 Muscle weakness (generalized): Secondary | ICD-10-CM | POA: Diagnosis not present

## 2022-11-08 DIAGNOSIS — M3210 Systemic lupus erythematosus, organ or system involvement unspecified: Secondary | ICD-10-CM | POA: Diagnosis not present

## 2022-11-08 DIAGNOSIS — R2681 Unsteadiness on feet: Secondary | ICD-10-CM | POA: Diagnosis not present

## 2022-11-08 DIAGNOSIS — F339 Major depressive disorder, recurrent, unspecified: Secondary | ICD-10-CM | POA: Diagnosis not present

## 2022-11-08 DIAGNOSIS — R262 Difficulty in walking, not elsewhere classified: Secondary | ICD-10-CM | POA: Diagnosis not present

## 2022-11-08 DIAGNOSIS — I69398 Other sequelae of cerebral infarction: Secondary | ICD-10-CM | POA: Diagnosis not present

## 2022-11-08 DIAGNOSIS — R627 Adult failure to thrive: Secondary | ICD-10-CM | POA: Diagnosis not present

## 2022-11-08 LAB — LEVETIRACETAM LEVEL: Levetiracetam Lvl: 37.7 ug/mL (ref 10.0–40.0)

## 2022-11-08 NOTE — Progress Notes (Signed)
Location:  [place] Room Number: NO/157/W Place of Service:  SNF (31)   CODE STATUS: dnr  No Known Allergies  Chief Complaint  Patient presents with   Acute Visit    Care plan meeting     HPI:  We have come together for her care plan meeting. BIMS 15/15 mood 5/30: trouble sleeping at times; nervous; some depression. She is now off hospice. She is independent with her wheelchair with no falls. She requires moderate assist for her adls. She is occasionally incontinent of bladder and continent of bowel. Dietary: weight is 134 pounds regular diet with finger foods appetite 50-100%. Therapy: ambulation at 150 feet working on adls with money and medication management. Activities: is getting out of room daily. 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  Chronic kidney disease (CKD) stage IV   Major depression chronic recurrent  Past Medical History:  Diagnosis Date   Arthritis    Rheumatoid   Dysphagia    post CVA   Hypertension    Lupus (systemic lupus erythematosus) ([place])    Seizure ([place])    Stroke [facility])     Past Surgical History:  Procedure Laterality Date   BIOPSY  02/21/2022   Procedure: BIOPSY;  Surgeon: [name], DO;  [facility];  Service: Endoscopy;;   CESAREAN SECTION     COLONOSCOPY WITH PROPOFOL N/A 02/21/2022   Procedure: COLONOSCOPY WITH PROPOFOL;  Surgeon: [name], DO;  [facility];  Service: Endoscopy;  Laterality: N/A;  2:00pm   ESOPHAGEAL BRUSHING  02/21/2022   Procedure: ESOPHAGEAL BRUSHING;  Surgeon: [name], DO;  [facility];  Service: Endoscopy;;   ESOPHAGOGASTRODUODENOSCOPY (EGD) WITH PROPOFOL N/A 02/21/2022   Procedure: ESOPHAGOGASTRODUODENOSCOPY (EGD) WITH PROPOFOL;  Surgeon: [name], DO;  [facility];  Service: Endoscopy;  Laterality: N/A;   POLYPECTOMY  02/21/2022   Procedure:  POLYPECTOMY;  Surgeon: [name], DO;  [facility]: [facility];  Service: Endoscopy;;    Social History   Socioeconomic History   Marital status: Single    Spouse name: Not on file   Number of children: Not on file   Years of education: Not on file   Highest education level: Not on file  Occupational History   Not on file  Tobacco Use   Smoking status: Never   Smokeless tobacco: Never  Vaping Use   Vaping Use: Never used  Substance and Sexual Activity   Alcohol use: No    Alcohol/week: 0.0 standard drinks of alcohol   Drug use: No   Sexual activity: Not on file  Other Topics Concern   Not on file  Social History Narrative   Lives with sister.     Social Determinants of Health   Financial Resource Strain: Not on file  Food Insecurity: Not on file  Transportation Needs: Not on file  Physical Activity: Not on file  Stress: Not on file  Social Connections: Not on file  Intimate Partner Violence: Not on file   Family History  Problem Relation Age of Onset   Diabetes Mother    Hypertension Mother    Cancer Mother    Stroke Mother    Colon cancer Father    Hypertension Sister    Hypertension Sister    Hypertension Sister    Hypertension Sister    Hypertension Sister    Hypertension Sister  Hypertension Brother    Hypertension Brother       VITAL SIGNS BP 138/78   Pulse 70   Temp 98.3 F (36.8 C)   Resp 16   Ht '5\' 6"'$  (1.676 m)   Wt 134 lb 3.2 oz (60.9 kg)   SpO2 100%   BMI 21.66 kg/m   Outpatient Encounter Medications as of 11/08/2022  Medication Sig   acetaminophen (TYLENOL) 325 MG tablet Take 650 mg by mouth every 6 (six) hours as needed.   Amino Acids-Protein Hydrolys (FEEDING SUPPLEMENT, PRO-STAT SUGAR FREE 64,) LIQD Take 30 mLs by mouth 3 (three) times daily with meals.   divalproex (DEPAKOTE) 500 MG DR tablet Take 1 tablet (500 mg total) by mouth 2 (two) times daily.   Ensure Plus (ENSURE PLUS) LIQD Take 237 mLs by mouth daily.    levETIRAcetam (KEPPRA) 100 MG/ML solution Take 750 mg by mouth 2 (two) times daily.   levothyroxine (SYNTHROID) 100 MCG tablet Take 100 mcg by mouth daily before breakfast.   LORazepam (ATIVAN) 2 MG/ML injection Inject 0.5 mLs (1 mg total) into the muscle every 15 (fifteen) minutes as needed. Give 1 mg IM very 15 minutes as needed for up to 3 doses; if not relieved after 3rd dose send to the ED   melatonin 3 MG TABS tablet Take 3 mg by mouth at bedtime.   ondansetron (ZOFRAN-ODT) 8 MG disintegrating tablet Take 8 mg by mouth every 6 (six) hours as needed for nausea or vomiting. Before meals and at bedtime   sennosides-docusate sodium (SENOKOT-S) 8.6-50 MG tablet Take 1 tablet by mouth 2 (two) times daily.   UNABLE TO FIND Diet: Regular diet. Send small portions.   No facility-administered encounter medications on file as of 11/08/2022.     SIGNIFICANT DIAGNOSTIC EXAMS   PREVIOUS   04-09-22: right humerus x-ray:No acute fracture or dislocation of the humerus or forearm.   04-09-22: pelvic x-ray  No evidence of acute hip fracture. Moderate bilateral hip osteoarthritis.  04-09-22: ct of head:  1.  No acute intracranial process. 2.  Left parietal vertex scalp hematoma.  04-09-22: ct of cervical spine:  No evidence of acute cervical spine fracture or traumatic listhesis.   NO NEW EXAMS    LABS REVIEWED:   12-14-21; wbc 3.4; hgb 8.9; hct 29.2; plt 723; glucose 80; bun 20; creat 1.36; k+ 5.2; na++ 136; ca 8.7  12-18-21: wbc 4.3; hgb 8.7; hct 28.8; plt 710; glucose 83; bun 20; creat 1.36;  k+ 4.1; na++ 141; ca 8.8  01-10-22: wbc 4.5; hgb 8.0; hct 25.8; mcv 82.2 plt 603; glucose 76; bun 19; creat 1.28; k+ 3.6; na++ 1140; ca 8.5; gfr 46; protein 7.0; albumin 2.3; chol 98; ldl 48; trig 151; hdl 20; tsh 2.976 free t4: 1.00; iron 23; tibc 135 02-18-22: hgb 7.7; hct 24.7; glucose 47; bun 9; creat 0.80; k+ 3.0; na++ 136; ca 7.6; gfr>60; protein 5.8; albumin 1.9 02-19-22: wbc 3.2; hgb 8.3; hct 26.0; mcv  82.5 plt 141; k+ 2.6  02-20-22: k+ 4.7  03-05-22: keppra level 42.0 (10-40 normal) 03-26-22: wbc 4.2; hgb 8.2; hct 26.3; mcv 86.8 plt 508; glucose 78; bun 9; creat 0.84; k+ 2.4; na++ 136; ca 7.6; gfr>60; protein 6.5; albumin 2.1; tsh 4.826; sed rate: 82 ANA+ 03-28-22: k+ 4.0 03-31-22: RPR nr; HIV nr 04-09-22: wbc 6.3; hgb 9.2 hct 30.1 mcv 91.8 plt 440; glucose 101; bun 12; creat 1.17; k + 3.5; na++ 134; ca 8.2; gfr 51 04-25-22: glucose 75; bun  12; creat 0.92; k+ 2.6; na++ 142; ca 8.0; gfr >60;  protein 5.4; albumin 2.4. depakote 86; vitamin B 12: 421; folate 3.2; iron 33; tibc 127. 04-26-22: k+ 4.4   NO NEW LABS.   Review of Systems  Constitutional:  Negative for malaise/fatigue.  Respiratory:  Negative for cough and shortness of breath.   Cardiovascular:  Negative for chest pain, palpitations and leg swelling.  Gastrointestinal:  Negative for abdominal pain, constipation and heartburn.  Musculoskeletal:  Negative for back pain, joint pain and myalgias.  Skin: Negative.   Neurological:  Negative for dizziness.  Psychiatric/Behavioral:  The patient is not nervous/anxious.    Physical Exam Constitutional:      General: She is not in acute distress.    Appearance: She is well-developed. She is not diaphoretic.  Neck:     Thyroid: No thyromegaly.  Cardiovascular:     Rate and Rhythm: Normal rate and regular rhythm.     Pulses: Normal pulses.     Heart sounds: Normal heart sounds.  Pulmonary:     Effort: Pulmonary effort is normal. No respiratory distress.     Breath sounds: Normal breath sounds.  Abdominal:     General: Bowel sounds are normal. There is no distension.     Palpations: Abdomen is soft.     Tenderness: There is no abdominal tenderness.  Musculoskeletal:        General: Normal range of motion.     Cervical back: Neck supple.     Right lower leg: No edema.     Left lower leg: No edema.  Lymphadenopathy:     Cervical: No cervical adenopathy.  Skin:    General: Skin is  warm and dry.  Neurological:     Mental Status: She is alert and oriented to person, place, and time.  Psychiatric:        Mood and Affect: Mood normal.       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   Time spent with patient 40 minutes: therapy; goals of care dietary.    Ok Edwards NP [facility]   call [phone]

## 2022-11-08 NOTE — Progress Notes (Deleted)
Location:  [place] Room Number: NO/157/W Place of Service:  SNF (31) Provider:  Gerlene Fee, NP  Patient Care Team: Gerlene Fee, NP as PCP - General (Geriatric Medicine) Minus Breeding, MD as PCP - Cardiology (Cardiology) [facility], [place] ([place])  Extended Emergency Contact Information Primary Emergency Contact: [place] Phone: [phone] Relation: Sister Secondary Emergency Contact: [place] Phone: [phone] Relation: Son Interpreter needed? No  Code Status:  DNR Goals of care: Advanced Directive information    11/08/2022   11:24 AM  Advanced Directives  Does Patient Have a Medical Advance Directive? Yes  Type of Advance Directive Out of facility DNR (pink MOST or yellow form)  Does patient want to make changes to medical advance directive? No - Patient declined     Chief Complaint  Patient presents with   Acute Visit    Care plan meeting     HPI:  Pt is a 67 y.o. female seen today for medical management of chronic diseases.     Past Medical History:  Diagnosis Date   Arthritis    Rheumatoid   Dysphagia    post CVA   Hypertension    Lupus (systemic lupus erythematosus) ([place])    Seizure ([place])    Stroke [facility])    Past Surgical History:  Procedure Laterality Date   BIOPSY  02/21/2022   Procedure: BIOPSY;  Surgeon: [name], DO;  [facility];  Service: Endoscopy;;   CESAREAN SECTION     COLONOSCOPY WITH PROPOFOL N/A 02/21/2022   Procedure: COLONOSCOPY WITH PROPOFOL;  Surgeon: [name], DO;  [facility];  Service: Endoscopy;  Laterality: N/A;  2:00pm   ESOPHAGEAL BRUSHING  02/21/2022   Procedure: ESOPHAGEAL BRUSHING;  Surgeon: [name], DO;  [facility];  Service: Endoscopy;;   ESOPHAGOGASTRODUODENOSCOPY (EGD) WITH PROPOFOL N/A 02/21/2022   Procedure: ESOPHAGOGASTRODUODENOSCOPY (EGD) WITH  PROPOFOL;  Surgeon: [name], DO;  [facility];  Service: Endoscopy;  Laterality: N/A;   POLYPECTOMY  02/21/2022   Procedure: POLYPECTOMY;  Surgeon: [name], DO;  [facility];  Service: Endoscopy;;    No Known Allergies  Outpatient Encounter Medications as of 11/08/2022  Medication Sig   acetaminophen (TYLENOL) 325 MG tablet Take 650 mg by mouth every 6 (six) hours as needed.   Amino Acids-Protein Hydrolys (FEEDING SUPPLEMENT, PRO-STAT SUGAR FREE 64,) LIQD Take 30 mLs by mouth 3 (three) times daily with meals.   divalproex (DEPAKOTE) 500 MG DR tablet Take 1 tablet (500 mg total) by mouth 2 (two) times daily.   Ensure Plus (ENSURE PLUS) LIQD Take 237 mLs by mouth daily.   levETIRAcetam (KEPPRA) 100 MG/ML solution Take 750 mg by mouth 2 (two) times daily.   levothyroxine (SYNTHROID) 100 MCG tablet Take 100 mcg by mouth daily before breakfast.   LORazepam (ATIVAN) 2 MG/ML injection Inject 0.5 mLs (1 mg total) into the muscle every 15 (fifteen) minutes as needed. Give 1 mg IM very 15 minutes as needed for up to 3 doses; if not relieved after 3rd dose send to the ED   melatonin 3 MG TABS tablet Take 3 mg by mouth at bedtime.   ondansetron (ZOFRAN-ODT) 8 MG disintegrating tablet Take 8 mg by mouth every 6 (six) hours as needed for nausea or vomiting. Before meals and at bedtime   sennosides-docusate sodium (SENOKOT-S) 8.6-50 MG tablet Take 1 tablet by  mouth 2 (two) times daily.   UNABLE TO FIND Diet: Regular diet. Send small portions.   No facility-administered encounter medications on file as of 11/08/2022.    Review of Systems  Immunization History  Administered Date(s) Administered   Influenza, High Dose Seasonal PF 06/18/2022   PFIZER(Purple Top)SARS-COV-2 Vaccination 05/10/2020, 05/31/2020   PNEUMOCOCCAL CONJUGATE-20 02/28/2022   Pfizer Covid-19 Vaccine Bivalent Booster 82yr & up 02/05/2022, 07/10/2022   Rsv, Bivalent, Protein Subunit Rsvpref,pf  ([place] 08/19/2022   Tdap 08/16/2021, 03/05/2022   Zoster Recombinat (Shingrix) 01/11/2022, 04/10/2022   Pertinent  Health Maintenance Due  Topic Date Due   MAMMOGRAM  Never done   DEXA SCAN  07/21/2023 (Originally 09/14/2020)   COLONOSCOPY (Pts [id number] coverage will need to be confirmed)  02/22/2032   INFLUENZA VACCINE  Completed      11/13/2021    3:34 PM 02/18/2022    1:53 PM 02/21/2022    1:20 PM 02/27/2022    2:49 PM 11/08/2022   11:25 AM  Fall Risk  Falls in the past year? 0 0  0 0  Was there an injury with Fall? 0 0  0 0  Fall Risk Category Calculator 0 0  0 0  Fall Risk Category (Retired) Low Low  Low   (RETIRED) Patient Fall Risk Level Low fall risk Low fall risk High fall risk Low fall risk   Patient at Risk for Falls Due to No Fall Risks Impaired balance/gait;Impaired mobility  Impaired balance/gait;Impaired mobility No Fall Risks  Fall risk Follow up     Falls evaluation completed   Functional Status Survey:    Vitals:   11/08/22 1119  BP: (!) 140/70  Pulse: 70  Resp: 16  Temp: 98.3 F (36.8 C)  SpO2: 100%  Weight: 134 lb 3.2 oz (60.9 kg)  Height: '5\' 6"'$  (1.676 m)   Body mass index is 21.66 kg/m. Physical Exam  Labs reviewed: Recent Labs    04/09/22 1618 04/25/22 0826 04/26/22 0825 11/07/22 0700  NA 134* 142  --  137  K 3.5 2.6* 4.4 3.8  CL 104 107  --  106  CO2 22 29  --  24  GLUCOSE 101* 75  --  77  BUN 12 12  --  25*  CREATININE 1.17* 0.92  --  1.37*  CALCIUM 8.2* 8.0*  --  8.5*   Recent Labs    03/26/22 0807 04/25/22 0826 11/07/22 0700  AST 16 9* 13*  ALT '7 7 6  '$ ALKPHOS 62 48 52  BILITOT 0.7 0.4 <0.1*  PROT 6.5 5.4* 6.9  ALBUMIN 2.1* 2.4* 2.8*   Recent Labs    03/26/22 0807 04/09/22 1732 11/07/22 0700  WBC 4.2 6.3 2.9*  NEUTROABS 3.1 5.4 1.3*  HGB 8.2* 9.2* 10.1*  HCT 26.3* 30.1* 31.4*  MCV 86.8 91.8 101.9*  PLT 508* 490* 213   Lab Results  Component Value Date   TSH 8.057 (H) 11/07/2022   No results found  for: "HGBA1C" Lab Results  Component Value Date   CHOL 172 11/07/2022   HDL 35 (L) 11/07/2022   LDLCALC 125 (H) 11/07/2022   TRIG 62 11/07/2022   CHOLHDL 4.9 11/07/2022    Significant Diagnostic Results in last 30 days:  No results found.  Assessment/Plan There are no diagnoses linked to this encounter.   Family/ staff Communication: ***  Labs/tests ordered:  ***

## 2022-11-08 NOTE — Progress Notes (Signed)
Location:  [facility] Room Number: NO/157/W Place of Service:  SNF (31)   CODE STATUS: dnr   No Known Allergies  Chief Complaint  Patient presents with   Acute Visit    Care plan meeting     HPI:  We have come together for her care plan meeting. Marland Kitchen BIMS 15/15 mood 5/30: trouble sleeping at times; nervous; some depression. She is now off hospice care. She is nonambulatory with no falls. She requires moderate assist with her adls. She is occasionally incontinent of bladder and continent of bowel. Dietary: weight is 134 pounds regular diet with finger foods appetite 50-100%; requires setup for meals. Therapy: PT/OT: transfers ambulation: 150 feet contact guard transfer supervision bed mobility supervision and adls for medication and money management. . Activities: getting out of the room daily. 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  Chronic kidney disease (CKD) stage IV  Major depression chronic recurrent  Past Medical History:  Diagnosis Date   Arthritis    Rheumatoid   Dysphagia    post CVA   Hypertension    Lupus (systemic lupus erythematosus) ([place])    Seizure ([place])    Stroke [facility])     Past Surgical History:  Procedure Laterality Date   BIOPSY  02/21/2022   Procedure: BIOPSY;  Surgeon: [name], DO;  [facility]: [facility];  Service: Endoscopy;;   CESAREAN SECTION     COLONOSCOPY WITH PROPOFOL N/A 02/21/2022   Procedure: COLONOSCOPY WITH PROPOFOL;  Surgeon: [name], DO;  Location: [facility];  Service: Endoscopy;  Laterality: N/A;  2:00pm   ESOPHAGEAL BRUSHING  02/21/2022   Procedure: ESOPHAGEAL BRUSHING;  Surgeon: [name], DO;  Location: [facility];  Service: Endoscopy;;   ESOPHAGOGASTRODUODENOSCOPY (EGD) WITH PROPOFOL N/A 02/21/2022   Procedure: ESOPHAGOGASTRODUODENOSCOPY (EGD) WITH PROPOFOL;  Surgeon: [name], DO;  Location: [facility]  END[facility];  Service: Endoscopy;  Laterality: N/A;   POLYPECTOMY  02/21/2022   Procedure: POLYPECTOMY;  Surgeon: [name], DO;  [facility]: [facility];  Service: Endoscopy;;    Social History   Socioeconomic History   Marital status: Single    Spouse name: Not on file   Number of children: Not on file   Years of education: Not on file   Highest education level: Not on file  Occupational History   Not on file  Tobacco Use   Smoking status: Never   Smokeless tobacco: Never  Vaping Use   Vaping Use: Never used  Substance and Sexual Activity   Alcohol use: No    Alcohol/week: 0.0 standard drinks of alcohol   Drug use: No   Sexual activity: Not on file  Other Topics Concern   Not on file  Social History Narrative   Lives with sister.     Social Determinants of Health   Financial Resource Strain: Not on file  Food Insecurity: Not on file  Transportation Needs: Not on file  Physical Activity: Not on file  Stress: Not on file  Social Connections: Not on file  Intimate Partner Violence: Not on file   Family History  Problem Relation Age of Onset   Diabetes Mother    Hypertension Mother    Cancer Mother    Stroke Mother    Colon cancer Father    Hypertension Sister    Hypertension Sister    Hypertension Sister  Hypertension Sister    Hypertension Sister    Hypertension Sister    Hypertension Brother    Hypertension Brother       VITAL SIGNS BP 138/78   Pulse 70   Temp 98.3 F (36.8 C)   Resp 16   Ht '5\' 6"'$  (1.676 m)   Wt 134 lb 3.2 oz (60.9 kg)   SpO2 100%   BMI 21.66 kg/m   Outpatient Encounter Medications as of 11/08/2022  Medication Sig   acetaminophen (TYLENOL) 325 MG tablet Take 650 mg by mouth every 6 (six) hours as needed.   Amino Acids-Protein Hydrolys (FEEDING SUPPLEMENT, PRO-STAT SUGAR FREE 64,) LIQD Take 30 mLs by mouth 3 (three) times daily with meals.   divalproex (DEPAKOTE) 500 MG DR tablet Take 1 tablet (500 mg total) by mouth 2  (two) times daily.   Ensure Plus (ENSURE PLUS) LIQD Take 237 mLs by mouth daily.   levETIRAcetam (KEPPRA) 100 MG/ML solution Take 750 mg by mouth 2 (two) times daily.   levothyroxine (SYNTHROID) 100 MCG tablet Take 100 mcg by mouth daily before breakfast.   LORazepam (ATIVAN) 2 MG/ML injection Inject 0.5 mLs (1 mg total) into the muscle every 15 (fifteen) minutes as needed. Give 1 mg IM very 15 minutes as needed for up to 3 doses; if not relieved after 3rd dose send to the ED   melatonin 3 MG TABS tablet Take 3 mg by mouth at bedtime.   ondansetron (ZOFRAN-ODT) 8 MG disintegrating tablet Take 8 mg by mouth every 6 (six) hours as needed for nausea or vomiting. Before meals and at bedtime   sennosides-docusate sodium (SENOKOT-S) 8.6-50 MG tablet Take 1 tablet by mouth 2 (two) times daily.   UNABLE TO FIND Diet: Regular diet. Send small portions.   No facility-administered encounter medications on file as of 11/08/2022.     SIGNIFICANT DIAGNOSTIC EXAMS  PREVIOUS   04-09-22: right humerus x-ray:No acute fracture or dislocation of the humerus or forearm.   04-09-22: pelvic x-ray  No evidence of acute hip fracture. Moderate bilateral hip osteoarthritis.  04-09-22: ct of head:  1.  No acute intracranial process. 2.  Left parietal vertex scalp hematoma.  04-09-22: ct of cervical spine:  No evidence of acute cervical spine fracture or traumatic listhesis.   NO NEW EXAMS    LABS REVIEWED:   12-14-21; wbc 3.4; hgb 8.9; hct 29.2; plt 723; glucose 80; bun 20; creat 1.36; k+ 5.2; na++ 136; ca 8.7  12-18-21: wbc 4.3; hgb 8.7; hct 28.8; plt 710; glucose 83; bun 20; creat 1.36;  k+ 4.1; na++ 141; ca 8.8  01-10-22: wbc 4.5; hgb 8.0; hct 25.8; mcv 82.2 plt 603; glucose 76; bun 19; creat 1.28; k+ 3.6; na++ 1140; ca 8.5; gfr 46; protein 7.0; albumin 2.3; chol 98; ldl 48; trig 151; hdl 20; tsh 2.976 free t4: 1.00; iron 23; tibc 135 02-18-22: hgb 7.7; hct 24.7; glucose 47; bun 9; creat 0.80; k+ 3.0; na++ 136; ca  7.6; gfr>60; protein 5.8; albumin 1.9 02-19-22: wbc 3.2; hgb 8.3; hct 26.0; mcv 82.5 plt 141; k+ 2.6  02-20-22: k+ 4.7  03-05-22: keppra level 42.0 (10-40 normal) 03-26-22: wbc 4.2; hgb 8.2; hct 26.3; mcv 86.8 plt 508; glucose 78; bun 9; creat 0.84; k+ 2.4; na++ 136; ca 7.6; gfr>60; protein 6.5; albumin 2.1; tsh 4.826; sed rate: 82 ANA+ 03-28-22: k+ 4.0 03-31-22: RPR nr; HIV nr 04-09-22: wbc 6.3; hgb 9.2 hct 30.1 mcv 91.8 plt 440; glucose 101; bun 12; creat  1.17; k + 3.5; na++ 134; ca 8.2; gfr 51 04-25-22: glucose 75; bun 12; creat 0.92; k+ 2.6; na++ 142; ca 8.0; gfr >60;  protein 5.4; albumin 2.4. depakote 86; vitamin B 12: 421; folate 3.2; iron 33; tibc 127. 04-26-22: k+ 4.4   NO NEW LABS.  Review of Systems  Constitutional:  Negative for malaise/fatigue.  Respiratory:  Negative for cough and shortness of breath.   Cardiovascular:  Negative for chest pain, palpitations and leg swelling.  Gastrointestinal:  Negative for abdominal pain, constipation and heartburn.  Musculoskeletal:  Negative for back pain, joint pain and myalgias.  Skin: Negative.   Neurological:  Negative for dizziness.  Psychiatric/Behavioral:  The patient is not nervous/anxious.    Physical Exam Constitutional:      General: She is not in acute distress.    Appearance: She is well-developed. She is not diaphoretic.  Neck:     Thyroid: No thyromegaly.  Cardiovascular:     Rate and Rhythm: Normal rate and regular rhythm.     Pulses: Normal pulses.     Heart sounds: Normal heart sounds.  Pulmonary:     Effort: Pulmonary effort is normal. No respiratory distress.     Breath sounds: Normal breath sounds.  Abdominal:     General: Bowel sounds are normal. There is no distension.     Palpations: Abdomen is soft.     Tenderness: There is no abdominal tenderness.  Musculoskeletal:        General: Normal range of motion.     Cervical back: Neck supple.     Right lower leg: No edema.     Left lower leg: No edema.   Lymphadenopathy:     Cervical: No cervical adenopathy.  Skin:    General: Skin is warm and dry.  Neurological:     Mental Status: She is alert and oriented to person, place, and time.  Psychiatric:        Mood and Affect: Mood normal.      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

## 2022-11-11 DIAGNOSIS — I69398 Other sequelae of cerebral infarction: Secondary | ICD-10-CM | POA: Diagnosis not present

## 2022-11-11 DIAGNOSIS — R2681 Unsteadiness on feet: Secondary | ICD-10-CM | POA: Diagnosis not present

## 2022-11-11 DIAGNOSIS — M6281 Muscle weakness (generalized): Secondary | ICD-10-CM | POA: Diagnosis not present

## 2022-11-11 DIAGNOSIS — R627 Adult failure to thrive: Secondary | ICD-10-CM | POA: Diagnosis not present

## 2022-11-11 DIAGNOSIS — I639 Cerebral infarction, unspecified: Secondary | ICD-10-CM | POA: Diagnosis not present

## 2022-11-11 DIAGNOSIS — M3210 Systemic lupus erythematosus, organ or system involvement unspecified: Secondary | ICD-10-CM | POA: Diagnosis not present

## 2022-11-11 DIAGNOSIS — R569 Unspecified convulsions: Secondary | ICD-10-CM | POA: Diagnosis not present

## 2022-11-11 DIAGNOSIS — R488 Other symbolic dysfunctions: Secondary | ICD-10-CM | POA: Diagnosis not present

## 2022-11-11 DIAGNOSIS — R262 Difficulty in walking, not elsewhere classified: Secondary | ICD-10-CM | POA: Diagnosis not present

## 2022-11-12 DIAGNOSIS — R2681 Unsteadiness on feet: Secondary | ICD-10-CM | POA: Diagnosis not present

## 2022-11-12 DIAGNOSIS — I639 Cerebral infarction, unspecified: Secondary | ICD-10-CM | POA: Diagnosis not present

## 2022-11-12 DIAGNOSIS — M6281 Muscle weakness (generalized): Secondary | ICD-10-CM | POA: Diagnosis not present

## 2022-11-12 DIAGNOSIS — R569 Unspecified convulsions: Secondary | ICD-10-CM | POA: Diagnosis not present

## 2022-11-12 DIAGNOSIS — R488 Other symbolic dysfunctions: Secondary | ICD-10-CM | POA: Diagnosis not present

## 2022-11-12 DIAGNOSIS — I69398 Other sequelae of cerebral infarction: Secondary | ICD-10-CM | POA: Diagnosis not present

## 2022-11-12 DIAGNOSIS — R627 Adult failure to thrive: Secondary | ICD-10-CM | POA: Diagnosis not present

## 2022-11-12 DIAGNOSIS — R262 Difficulty in walking, not elsewhere classified: Secondary | ICD-10-CM | POA: Diagnosis not present

## 2022-11-12 DIAGNOSIS — M3210 Systemic lupus erythematosus, organ or system involvement unspecified: Secondary | ICD-10-CM | POA: Diagnosis not present

## 2022-11-13 DIAGNOSIS — R569 Unspecified convulsions: Secondary | ICD-10-CM | POA: Diagnosis not present

## 2022-11-13 DIAGNOSIS — R627 Adult failure to thrive: Secondary | ICD-10-CM | POA: Diagnosis not present

## 2022-11-13 DIAGNOSIS — R488 Other symbolic dysfunctions: Secondary | ICD-10-CM | POA: Diagnosis not present

## 2022-11-13 DIAGNOSIS — I69398 Other sequelae of cerebral infarction: Secondary | ICD-10-CM | POA: Diagnosis not present

## 2022-11-13 DIAGNOSIS — R2681 Unsteadiness on feet: Secondary | ICD-10-CM | POA: Diagnosis not present

## 2022-11-13 DIAGNOSIS — M3210 Systemic lupus erythematosus, organ or system involvement unspecified: Secondary | ICD-10-CM | POA: Diagnosis not present

## 2022-11-13 DIAGNOSIS — I639 Cerebral infarction, unspecified: Secondary | ICD-10-CM | POA: Diagnosis not present

## 2022-11-13 DIAGNOSIS — M6281 Muscle weakness (generalized): Secondary | ICD-10-CM | POA: Diagnosis not present

## 2022-11-13 DIAGNOSIS — R262 Difficulty in walking, not elsewhere classified: Secondary | ICD-10-CM | POA: Diagnosis not present

## 2022-11-14 ENCOUNTER — Other Ambulatory Visit (HOSPITAL_COMMUNITY)
Admission: RE | Admit: 2022-11-14 | Discharge: 2022-11-14 | Disposition: A | Discharge status: Home / Self Care | Payer: Medicare Other | Source: Skilled Nursing Facility | Attending: Adult Health | Admitting: Adult Health

## 2022-11-14 DIAGNOSIS — R2681 Unsteadiness on feet: Secondary | ICD-10-CM | POA: Diagnosis not present

## 2022-11-14 DIAGNOSIS — G40909 Epilepsy, unspecified, not intractable, without status epilepticus: Secondary | ICD-10-CM | POA: Insufficient documentation

## 2022-11-14 DIAGNOSIS — M6281 Muscle weakness (generalized): Secondary | ICD-10-CM | POA: Diagnosis not present

## 2022-11-14 DIAGNOSIS — I69398 Other sequelae of cerebral infarction: Secondary | ICD-10-CM | POA: Diagnosis not present

## 2022-11-14 DIAGNOSIS — R262 Difficulty in walking, not elsewhere classified: Secondary | ICD-10-CM | POA: Diagnosis not present

## 2022-11-14 DIAGNOSIS — R569 Unspecified convulsions: Secondary | ICD-10-CM | POA: Diagnosis not present

## 2022-11-14 DIAGNOSIS — M3210 Systemic lupus erythematosus, organ or system involvement unspecified: Secondary | ICD-10-CM | POA: Diagnosis not present

## 2022-11-14 DIAGNOSIS — R488 Other symbolic dysfunctions: Secondary | ICD-10-CM | POA: Diagnosis not present

## 2022-11-14 DIAGNOSIS — I639 Cerebral infarction, unspecified: Secondary | ICD-10-CM | POA: Diagnosis not present

## 2022-11-14 DIAGNOSIS — R627 Adult failure to thrive: Secondary | ICD-10-CM | POA: Diagnosis not present

## 2022-11-14 LAB — VITAMIN B12: Vitamin B-12: 197 pg/mL (ref 180–914)

## 2022-11-15 DIAGNOSIS — R488 Other symbolic dysfunctions: Secondary | ICD-10-CM | POA: Diagnosis not present

## 2022-11-15 DIAGNOSIS — R569 Unspecified convulsions: Secondary | ICD-10-CM | POA: Diagnosis not present

## 2022-11-15 DIAGNOSIS — I639 Cerebral infarction, unspecified: Secondary | ICD-10-CM | POA: Diagnosis not present

## 2022-11-15 DIAGNOSIS — I69398 Other sequelae of cerebral infarction: Secondary | ICD-10-CM | POA: Diagnosis not present

## 2022-11-15 DIAGNOSIS — R2681 Unsteadiness on feet: Secondary | ICD-10-CM | POA: Diagnosis not present

## 2022-11-15 DIAGNOSIS — M6281 Muscle weakness (generalized): Secondary | ICD-10-CM | POA: Diagnosis not present

## 2022-11-15 DIAGNOSIS — R262 Difficulty in walking, not elsewhere classified: Secondary | ICD-10-CM | POA: Diagnosis not present

## 2022-11-15 DIAGNOSIS — R627 Adult failure to thrive: Secondary | ICD-10-CM | POA: Diagnosis not present

## 2022-11-15 DIAGNOSIS — M3210 Systemic lupus erythematosus, organ or system involvement unspecified: Secondary | ICD-10-CM | POA: Diagnosis not present

## 2022-11-15 LAB — LEVETIRACETAM LEVEL: Levetiracetam Lvl: 34.9 ug/mL (ref 10.0–40.0)

## 2022-11-16 DIAGNOSIS — I639 Cerebral infarction, unspecified: Secondary | ICD-10-CM | POA: Diagnosis not present

## 2022-11-16 DIAGNOSIS — M3210 Systemic lupus erythematosus, organ or system involvement unspecified: Secondary | ICD-10-CM | POA: Diagnosis not present

## 2022-11-16 DIAGNOSIS — I69398 Other sequelae of cerebral infarction: Secondary | ICD-10-CM | POA: Diagnosis not present

## 2022-11-16 DIAGNOSIS — R2681 Unsteadiness on feet: Secondary | ICD-10-CM | POA: Diagnosis not present

## 2022-11-16 DIAGNOSIS — R488 Other symbolic dysfunctions: Secondary | ICD-10-CM | POA: Diagnosis not present

## 2022-11-16 DIAGNOSIS — R262 Difficulty in walking, not elsewhere classified: Secondary | ICD-10-CM | POA: Diagnosis not present

## 2022-11-16 DIAGNOSIS — M6281 Muscle weakness (generalized): Secondary | ICD-10-CM | POA: Diagnosis not present

## 2022-11-16 DIAGNOSIS — R569 Unspecified convulsions: Secondary | ICD-10-CM | POA: Diagnosis not present

## 2022-11-16 DIAGNOSIS — R627 Adult failure to thrive: Secondary | ICD-10-CM | POA: Diagnosis not present

## 2022-11-18 DIAGNOSIS — R262 Difficulty in walking, not elsewhere classified: Secondary | ICD-10-CM | POA: Diagnosis not present

## 2022-11-18 DIAGNOSIS — I69398 Other sequelae of cerebral infarction: Secondary | ICD-10-CM | POA: Diagnosis not present

## 2022-11-18 DIAGNOSIS — M6281 Muscle weakness (generalized): Secondary | ICD-10-CM | POA: Diagnosis not present

## 2022-11-18 DIAGNOSIS — R2681 Unsteadiness on feet: Secondary | ICD-10-CM | POA: Diagnosis not present

## 2022-11-18 DIAGNOSIS — R569 Unspecified convulsions: Secondary | ICD-10-CM | POA: Diagnosis not present

## 2022-11-18 DIAGNOSIS — R488 Other symbolic dysfunctions: Secondary | ICD-10-CM | POA: Diagnosis not present

## 2022-11-18 DIAGNOSIS — M3210 Systemic lupus erythematosus, organ or system involvement unspecified: Secondary | ICD-10-CM | POA: Diagnosis not present

## 2022-11-18 DIAGNOSIS — R627 Adult failure to thrive: Secondary | ICD-10-CM | POA: Diagnosis not present

## 2022-11-18 DIAGNOSIS — I639 Cerebral infarction, unspecified: Secondary | ICD-10-CM | POA: Diagnosis not present

## 2022-11-19 DIAGNOSIS — R2681 Unsteadiness on feet: Secondary | ICD-10-CM | POA: Diagnosis not present

## 2022-11-19 DIAGNOSIS — I69398 Other sequelae of cerebral infarction: Secondary | ICD-10-CM | POA: Diagnosis not present

## 2022-11-19 DIAGNOSIS — M6281 Muscle weakness (generalized): Secondary | ICD-10-CM | POA: Diagnosis not present

## 2022-11-19 DIAGNOSIS — I639 Cerebral infarction, unspecified: Secondary | ICD-10-CM | POA: Diagnosis not present

## 2022-11-19 DIAGNOSIS — R569 Unspecified convulsions: Secondary | ICD-10-CM | POA: Diagnosis not present

## 2022-11-19 DIAGNOSIS — M3210 Systemic lupus erythematosus, organ or system involvement unspecified: Secondary | ICD-10-CM | POA: Diagnosis not present

## 2022-11-19 DIAGNOSIS — R488 Other symbolic dysfunctions: Secondary | ICD-10-CM | POA: Diagnosis not present

## 2022-11-19 DIAGNOSIS — R627 Adult failure to thrive: Secondary | ICD-10-CM | POA: Diagnosis not present

## 2022-11-19 DIAGNOSIS — R262 Difficulty in walking, not elsewhere classified: Secondary | ICD-10-CM | POA: Diagnosis not present

## 2022-11-20 DIAGNOSIS — I69398 Other sequelae of cerebral infarction: Secondary | ICD-10-CM | POA: Diagnosis not present

## 2022-11-20 DIAGNOSIS — R2681 Unsteadiness on feet: Secondary | ICD-10-CM | POA: Diagnosis not present

## 2022-11-20 DIAGNOSIS — R488 Other symbolic dysfunctions: Secondary | ICD-10-CM | POA: Diagnosis not present

## 2022-11-20 DIAGNOSIS — M6281 Muscle weakness (generalized): Secondary | ICD-10-CM | POA: Diagnosis not present

## 2022-11-20 DIAGNOSIS — R262 Difficulty in walking, not elsewhere classified: Secondary | ICD-10-CM | POA: Diagnosis not present

## 2022-11-20 DIAGNOSIS — R627 Adult failure to thrive: Secondary | ICD-10-CM | POA: Diagnosis not present

## 2022-11-20 DIAGNOSIS — R569 Unspecified convulsions: Secondary | ICD-10-CM | POA: Diagnosis not present

## 2022-11-20 DIAGNOSIS — M3210 Systemic lupus erythematosus, organ or system involvement unspecified: Secondary | ICD-10-CM | POA: Diagnosis not present

## 2022-11-20 DIAGNOSIS — I639 Cerebral infarction, unspecified: Secondary | ICD-10-CM | POA: Diagnosis not present

## 2022-11-21 DIAGNOSIS — R262 Difficulty in walking, not elsewhere classified: Secondary | ICD-10-CM | POA: Diagnosis not present

## 2022-11-21 DIAGNOSIS — I69398 Other sequelae of cerebral infarction: Secondary | ICD-10-CM | POA: Diagnosis not present

## 2022-11-21 DIAGNOSIS — R627 Adult failure to thrive: Secondary | ICD-10-CM | POA: Diagnosis not present

## 2022-11-21 DIAGNOSIS — R488 Other symbolic dysfunctions: Secondary | ICD-10-CM | POA: Diagnosis not present

## 2022-11-21 DIAGNOSIS — I639 Cerebral infarction, unspecified: Secondary | ICD-10-CM | POA: Diagnosis not present

## 2022-11-21 DIAGNOSIS — M3210 Systemic lupus erythematosus, organ or system involvement unspecified: Secondary | ICD-10-CM | POA: Diagnosis not present

## 2022-11-21 DIAGNOSIS — R2681 Unsteadiness on feet: Secondary | ICD-10-CM | POA: Diagnosis not present

## 2022-11-21 DIAGNOSIS — M6281 Muscle weakness (generalized): Secondary | ICD-10-CM | POA: Diagnosis not present

## 2022-11-21 DIAGNOSIS — R569 Unspecified convulsions: Secondary | ICD-10-CM | POA: Diagnosis not present

## 2022-11-22 DIAGNOSIS — R488 Other symbolic dysfunctions: Secondary | ICD-10-CM | POA: Diagnosis not present

## 2022-11-22 DIAGNOSIS — R569 Unspecified convulsions: Secondary | ICD-10-CM | POA: Diagnosis not present

## 2022-11-22 DIAGNOSIS — R2681 Unsteadiness on feet: Secondary | ICD-10-CM | POA: Diagnosis not present

## 2022-11-22 DIAGNOSIS — I69398 Other sequelae of cerebral infarction: Secondary | ICD-10-CM | POA: Diagnosis not present

## 2022-11-22 DIAGNOSIS — R262 Difficulty in walking, not elsewhere classified: Secondary | ICD-10-CM | POA: Diagnosis not present

## 2022-11-22 DIAGNOSIS — I639 Cerebral infarction, unspecified: Secondary | ICD-10-CM | POA: Diagnosis not present

## 2022-11-22 DIAGNOSIS — M3210 Systemic lupus erythematosus, organ or system involvement unspecified: Secondary | ICD-10-CM | POA: Diagnosis not present

## 2022-11-22 DIAGNOSIS — M6281 Muscle weakness (generalized): Secondary | ICD-10-CM | POA: Diagnosis not present

## 2022-11-22 DIAGNOSIS — R627 Adult failure to thrive: Secondary | ICD-10-CM | POA: Diagnosis not present

## 2022-11-24 DIAGNOSIS — M6281 Muscle weakness (generalized): Secondary | ICD-10-CM | POA: Diagnosis not present

## 2022-11-24 DIAGNOSIS — I639 Cerebral infarction, unspecified: Secondary | ICD-10-CM | POA: Diagnosis not present

## 2022-11-24 DIAGNOSIS — I69398 Other sequelae of cerebral infarction: Secondary | ICD-10-CM | POA: Diagnosis not present

## 2022-11-24 DIAGNOSIS — R569 Unspecified convulsions: Secondary | ICD-10-CM | POA: Diagnosis not present

## 2022-11-24 DIAGNOSIS — R627 Adult failure to thrive: Secondary | ICD-10-CM | POA: Diagnosis not present

## 2022-11-24 DIAGNOSIS — R488 Other symbolic dysfunctions: Secondary | ICD-10-CM | POA: Diagnosis not present

## 2022-11-24 DIAGNOSIS — R262 Difficulty in walking, not elsewhere classified: Secondary | ICD-10-CM | POA: Diagnosis not present

## 2022-11-24 DIAGNOSIS — M3210 Systemic lupus erythematosus, organ or system involvement unspecified: Secondary | ICD-10-CM | POA: Diagnosis not present

## 2022-11-24 DIAGNOSIS — R2681 Unsteadiness on feet: Secondary | ICD-10-CM | POA: Diagnosis not present

## 2022-11-25 DIAGNOSIS — M6281 Muscle weakness (generalized): Secondary | ICD-10-CM | POA: Diagnosis not present

## 2022-11-25 DIAGNOSIS — R488 Other symbolic dysfunctions: Secondary | ICD-10-CM | POA: Diagnosis not present

## 2022-11-25 DIAGNOSIS — M3210 Systemic lupus erythematosus, organ or system involvement unspecified: Secondary | ICD-10-CM | POA: Diagnosis not present

## 2022-11-25 DIAGNOSIS — R262 Difficulty in walking, not elsewhere classified: Secondary | ICD-10-CM | POA: Diagnosis not present

## 2022-11-25 DIAGNOSIS — R569 Unspecified convulsions: Secondary | ICD-10-CM | POA: Diagnosis not present

## 2022-11-25 DIAGNOSIS — I639 Cerebral infarction, unspecified: Secondary | ICD-10-CM | POA: Diagnosis not present

## 2022-11-25 DIAGNOSIS — R627 Adult failure to thrive: Secondary | ICD-10-CM | POA: Diagnosis not present

## 2022-11-25 DIAGNOSIS — R2681 Unsteadiness on feet: Secondary | ICD-10-CM | POA: Diagnosis not present

## 2022-11-25 DIAGNOSIS — I69398 Other sequelae of cerebral infarction: Secondary | ICD-10-CM | POA: Diagnosis not present

## 2022-11-26 DIAGNOSIS — R569 Unspecified convulsions: Secondary | ICD-10-CM | POA: Diagnosis not present

## 2022-11-26 DIAGNOSIS — I639 Cerebral infarction, unspecified: Secondary | ICD-10-CM | POA: Diagnosis not present

## 2022-11-26 DIAGNOSIS — M3210 Systemic lupus erythematosus, organ or system involvement unspecified: Secondary | ICD-10-CM | POA: Diagnosis not present

## 2022-11-26 DIAGNOSIS — R488 Other symbolic dysfunctions: Secondary | ICD-10-CM | POA: Diagnosis not present

## 2022-11-26 DIAGNOSIS — I69398 Other sequelae of cerebral infarction: Secondary | ICD-10-CM | POA: Diagnosis not present

## 2022-11-26 DIAGNOSIS — R627 Adult failure to thrive: Secondary | ICD-10-CM | POA: Diagnosis not present

## 2022-11-26 DIAGNOSIS — M6281 Muscle weakness (generalized): Secondary | ICD-10-CM | POA: Diagnosis not present

## 2022-11-26 DIAGNOSIS — R2681 Unsteadiness on feet: Secondary | ICD-10-CM | POA: Diagnosis not present

## 2022-11-26 DIAGNOSIS — R262 Difficulty in walking, not elsewhere classified: Secondary | ICD-10-CM | POA: Diagnosis not present

## 2022-11-27 DIAGNOSIS — R627 Adult failure to thrive: Secondary | ICD-10-CM | POA: Diagnosis not present

## 2022-11-27 DIAGNOSIS — R488 Other symbolic dysfunctions: Secondary | ICD-10-CM | POA: Diagnosis not present

## 2022-11-27 DIAGNOSIS — M3210 Systemic lupus erythematosus, organ or system involvement unspecified: Secondary | ICD-10-CM | POA: Diagnosis not present

## 2022-11-27 DIAGNOSIS — R2681 Unsteadiness on feet: Secondary | ICD-10-CM | POA: Diagnosis not present

## 2022-11-27 DIAGNOSIS — I69398 Other sequelae of cerebral infarction: Secondary | ICD-10-CM | POA: Diagnosis not present

## 2022-11-27 DIAGNOSIS — I639 Cerebral infarction, unspecified: Secondary | ICD-10-CM | POA: Diagnosis not present

## 2022-11-27 DIAGNOSIS — M6281 Muscle weakness (generalized): Secondary | ICD-10-CM | POA: Diagnosis not present

## 2022-11-27 DIAGNOSIS — R569 Unspecified convulsions: Secondary | ICD-10-CM | POA: Diagnosis not present

## 2022-11-27 DIAGNOSIS — R262 Difficulty in walking, not elsewhere classified: Secondary | ICD-10-CM | POA: Diagnosis not present

## 2022-11-28 DIAGNOSIS — R262 Difficulty in walking, not elsewhere classified: Secondary | ICD-10-CM | POA: Diagnosis not present

## 2022-11-28 DIAGNOSIS — R627 Adult failure to thrive: Secondary | ICD-10-CM | POA: Diagnosis not present

## 2022-11-28 DIAGNOSIS — R488 Other symbolic dysfunctions: Secondary | ICD-10-CM | POA: Diagnosis not present

## 2022-11-28 DIAGNOSIS — M3210 Systemic lupus erythematosus, organ or system involvement unspecified: Secondary | ICD-10-CM | POA: Diagnosis not present

## 2022-11-28 DIAGNOSIS — I639 Cerebral infarction, unspecified: Secondary | ICD-10-CM | POA: Diagnosis not present

## 2022-11-28 DIAGNOSIS — R569 Unspecified convulsions: Secondary | ICD-10-CM | POA: Diagnosis not present

## 2022-11-28 DIAGNOSIS — I69398 Other sequelae of cerebral infarction: Secondary | ICD-10-CM | POA: Diagnosis not present

## 2022-11-28 DIAGNOSIS — M6281 Muscle weakness (generalized): Secondary | ICD-10-CM | POA: Diagnosis not present

## 2022-11-28 DIAGNOSIS — R2681 Unsteadiness on feet: Secondary | ICD-10-CM | POA: Diagnosis not present

## 2022-11-29 DIAGNOSIS — R569 Unspecified convulsions: Secondary | ICD-10-CM | POA: Diagnosis not present

## 2022-11-29 DIAGNOSIS — R627 Adult failure to thrive: Secondary | ICD-10-CM | POA: Diagnosis not present

## 2022-11-29 DIAGNOSIS — R262 Difficulty in walking, not elsewhere classified: Secondary | ICD-10-CM | POA: Diagnosis not present

## 2022-11-29 DIAGNOSIS — M6281 Muscle weakness (generalized): Secondary | ICD-10-CM | POA: Diagnosis not present

## 2022-11-29 DIAGNOSIS — M3210 Systemic lupus erythematosus, organ or system involvement unspecified: Secondary | ICD-10-CM | POA: Diagnosis not present

## 2022-11-29 DIAGNOSIS — I639 Cerebral infarction, unspecified: Secondary | ICD-10-CM | POA: Diagnosis not present

## 2022-11-29 DIAGNOSIS — R2681 Unsteadiness on feet: Secondary | ICD-10-CM | POA: Diagnosis not present

## 2022-11-29 DIAGNOSIS — R488 Other symbolic dysfunctions: Secondary | ICD-10-CM | POA: Diagnosis not present

## 2022-11-29 DIAGNOSIS — I69398 Other sequelae of cerebral infarction: Secondary | ICD-10-CM | POA: Diagnosis not present

## 2022-12-02 DIAGNOSIS — R488 Other symbolic dysfunctions: Secondary | ICD-10-CM | POA: Diagnosis not present

## 2022-12-02 DIAGNOSIS — R569 Unspecified convulsions: Secondary | ICD-10-CM | POA: Diagnosis not present

## 2022-12-02 DIAGNOSIS — R262 Difficulty in walking, not elsewhere classified: Secondary | ICD-10-CM | POA: Diagnosis not present

## 2022-12-02 DIAGNOSIS — I639 Cerebral infarction, unspecified: Secondary | ICD-10-CM | POA: Diagnosis not present

## 2022-12-02 DIAGNOSIS — I69398 Other sequelae of cerebral infarction: Secondary | ICD-10-CM | POA: Diagnosis not present

## 2022-12-02 DIAGNOSIS — M6281 Muscle weakness (generalized): Secondary | ICD-10-CM | POA: Diagnosis not present

## 2022-12-02 DIAGNOSIS — M3210 Systemic lupus erythematosus, organ or system involvement unspecified: Secondary | ICD-10-CM | POA: Diagnosis not present

## 2022-12-02 DIAGNOSIS — R627 Adult failure to thrive: Secondary | ICD-10-CM | POA: Diagnosis not present

## 2022-12-02 DIAGNOSIS — R2681 Unsteadiness on feet: Secondary | ICD-10-CM | POA: Diagnosis not present

## 2022-12-03 DIAGNOSIS — M6281 Muscle weakness (generalized): Secondary | ICD-10-CM | POA: Diagnosis not present

## 2022-12-03 DIAGNOSIS — R262 Difficulty in walking, not elsewhere classified: Secondary | ICD-10-CM | POA: Diagnosis not present

## 2022-12-03 DIAGNOSIS — R488 Other symbolic dysfunctions: Secondary | ICD-10-CM | POA: Diagnosis not present

## 2022-12-03 DIAGNOSIS — R2681 Unsteadiness on feet: Secondary | ICD-10-CM | POA: Diagnosis not present

## 2022-12-03 DIAGNOSIS — R627 Adult failure to thrive: Secondary | ICD-10-CM | POA: Diagnosis not present

## 2022-12-03 DIAGNOSIS — R569 Unspecified convulsions: Secondary | ICD-10-CM | POA: Diagnosis not present

## 2022-12-03 DIAGNOSIS — I639 Cerebral infarction, unspecified: Secondary | ICD-10-CM | POA: Diagnosis not present

## 2022-12-03 DIAGNOSIS — I69398 Other sequelae of cerebral infarction: Secondary | ICD-10-CM | POA: Diagnosis not present

## 2022-12-03 DIAGNOSIS — M3210 Systemic lupus erythematosus, organ or system involvement unspecified: Secondary | ICD-10-CM | POA: Diagnosis not present

## 2022-12-04 DIAGNOSIS — I639 Cerebral infarction, unspecified: Secondary | ICD-10-CM | POA: Diagnosis not present

## 2022-12-04 DIAGNOSIS — R569 Unspecified convulsions: Secondary | ICD-10-CM | POA: Diagnosis not present

## 2022-12-04 DIAGNOSIS — R2681 Unsteadiness on feet: Secondary | ICD-10-CM | POA: Diagnosis not present

## 2022-12-04 DIAGNOSIS — R262 Difficulty in walking, not elsewhere classified: Secondary | ICD-10-CM | POA: Diagnosis not present

## 2022-12-04 DIAGNOSIS — M6281 Muscle weakness (generalized): Secondary | ICD-10-CM | POA: Diagnosis not present

## 2022-12-04 DIAGNOSIS — M3210 Systemic lupus erythematosus, organ or system involvement unspecified: Secondary | ICD-10-CM | POA: Diagnosis not present

## 2022-12-04 DIAGNOSIS — I69398 Other sequelae of cerebral infarction: Secondary | ICD-10-CM | POA: Diagnosis not present

## 2022-12-04 DIAGNOSIS — R627 Adult failure to thrive: Secondary | ICD-10-CM | POA: Diagnosis not present

## 2022-12-04 DIAGNOSIS — R488 Other symbolic dysfunctions: Secondary | ICD-10-CM | POA: Diagnosis not present

## 2022-12-05 DIAGNOSIS — I639 Cerebral infarction, unspecified: Secondary | ICD-10-CM | POA: Diagnosis not present

## 2022-12-05 DIAGNOSIS — R2681 Unsteadiness on feet: Secondary | ICD-10-CM | POA: Diagnosis not present

## 2022-12-05 DIAGNOSIS — R262 Difficulty in walking, not elsewhere classified: Secondary | ICD-10-CM | POA: Diagnosis not present

## 2022-12-05 DIAGNOSIS — M6281 Muscle weakness (generalized): Secondary | ICD-10-CM | POA: Diagnosis not present

## 2022-12-05 DIAGNOSIS — R627 Adult failure to thrive: Secondary | ICD-10-CM | POA: Diagnosis not present

## 2022-12-05 DIAGNOSIS — R488 Other symbolic dysfunctions: Secondary | ICD-10-CM | POA: Diagnosis not present

## 2022-12-05 DIAGNOSIS — R569 Unspecified convulsions: Secondary | ICD-10-CM | POA: Diagnosis not present

## 2022-12-05 DIAGNOSIS — M3210 Systemic lupus erythematosus, organ or system involvement unspecified: Secondary | ICD-10-CM | POA: Diagnosis not present

## 2022-12-05 DIAGNOSIS — I69398 Other sequelae of cerebral infarction: Secondary | ICD-10-CM | POA: Diagnosis not present

## 2022-12-06 DIAGNOSIS — R488 Other symbolic dysfunctions: Secondary | ICD-10-CM | POA: Diagnosis not present

## 2022-12-06 DIAGNOSIS — I639 Cerebral infarction, unspecified: Secondary | ICD-10-CM | POA: Diagnosis not present

## 2022-12-06 DIAGNOSIS — R569 Unspecified convulsions: Secondary | ICD-10-CM | POA: Diagnosis not present

## 2022-12-06 DIAGNOSIS — R2681 Unsteadiness on feet: Secondary | ICD-10-CM | POA: Diagnosis not present

## 2022-12-06 DIAGNOSIS — R627 Adult failure to thrive: Secondary | ICD-10-CM | POA: Diagnosis not present

## 2022-12-06 DIAGNOSIS — I69398 Other sequelae of cerebral infarction: Secondary | ICD-10-CM | POA: Diagnosis not present

## 2022-12-06 DIAGNOSIS — M6281 Muscle weakness (generalized): Secondary | ICD-10-CM | POA: Diagnosis not present

## 2022-12-06 DIAGNOSIS — M3210 Systemic lupus erythematosus, organ or system involvement unspecified: Secondary | ICD-10-CM | POA: Diagnosis not present

## 2022-12-06 DIAGNOSIS — R262 Difficulty in walking, not elsewhere classified: Secondary | ICD-10-CM | POA: Diagnosis not present

## 2022-12-09 ENCOUNTER — Non-Acute Institutional Stay (SKILLED_NURSING_FACILITY): Payer: Medicare Other | Admitting: Internal Medicine

## 2022-12-09 ENCOUNTER — Encounter: Payer: Self-pay | Admitting: Internal Medicine

## 2022-12-09 DIAGNOSIS — E039 Hypothyroidism, unspecified: Secondary | ICD-10-CM

## 2022-12-09 DIAGNOSIS — M6281 Muscle weakness (generalized): Secondary | ICD-10-CM | POA: Diagnosis not present

## 2022-12-09 DIAGNOSIS — N184 Chronic kidney disease, stage 4 (severe): Secondary | ICD-10-CM

## 2022-12-09 DIAGNOSIS — M3210 Systemic lupus erythematosus, organ or system involvement unspecified: Secondary | ICD-10-CM | POA: Diagnosis not present

## 2022-12-09 DIAGNOSIS — R569 Unspecified convulsions: Secondary | ICD-10-CM | POA: Diagnosis not present

## 2022-12-09 DIAGNOSIS — I69398 Other sequelae of cerebral infarction: Secondary | ICD-10-CM | POA: Diagnosis not present

## 2022-12-09 DIAGNOSIS — I10 Essential (primary) hypertension: Secondary | ICD-10-CM | POA: Diagnosis not present

## 2022-12-09 DIAGNOSIS — I639 Cerebral infarction, unspecified: Secondary | ICD-10-CM | POA: Diagnosis not present

## 2022-12-09 DIAGNOSIS — R627 Adult failure to thrive: Secondary | ICD-10-CM

## 2022-12-09 DIAGNOSIS — E785 Hyperlipidemia, unspecified: Secondary | ICD-10-CM | POA: Diagnosis not present

## 2022-12-09 DIAGNOSIS — R2681 Unsteadiness on feet: Secondary | ICD-10-CM | POA: Diagnosis not present

## 2022-12-09 DIAGNOSIS — R262 Difficulty in walking, not elsewhere classified: Secondary | ICD-10-CM | POA: Diagnosis not present

## 2022-12-09 DIAGNOSIS — R488 Other symbolic dysfunctions: Secondary | ICD-10-CM | POA: Diagnosis not present

## 2022-12-09 NOTE — Assessment & Plan Note (Addendum)
On no statin ; reconsider now off Hospice.

## 2022-12-09 NOTE — Assessment & Plan Note (Signed)
CKD stage IIIb present with creatinine of 1.37 and GFR 42.  Med list reviewed; no indication for change in medications or dosage unless her progression of CKD.

## 2022-12-09 NOTE — Assessment & Plan Note (Signed)
She states that she no longer has anorexia and is gaining weight.  Nutritionist continues to follow her here at the SNF.  Albumin 3.8 but total protein is 6.9.

## 2022-12-09 NOTE — Assessment & Plan Note (Addendum)
11/07/2022 TSH 8.057;now on 100 mcg of L-thyroxine.  Repeat TSH after 6-8 weeks of increased dosage.

## 2022-12-09 NOTE — Progress Notes (Signed)
NURSING HOME LOCATION:  [facility] ROOM NUMBER:  40 W  CODE STATUS:  DNR  PCP:  Ok Edwards NP  This is a nursing facility follow up visit of chronic medical diagnoses & to document compliance with Regulation 483.30 (c) in [employer] Phase 2 which mandates caregiver visit ( visits can alternate among physician, PA or NP as per statutes) within 10 days of 30 days / 60 days/ 90 days post admission to SNF date    Interim medical record and care since last SNF visit was updated with review of diagnostic studies and change in clinical status since last visit were documented.  HPI: She is a permanent resident of this facility with medical diagnoses of essential hypertension, history of lupus erythematosus, history of seizure, history of stroke complicated by dysphagia and seizure activity, & history of colonic polyp.  Labs were most recently completed 11/07/2022.  CKD stage IIIb was present with a creatinine of 1.37 and GFR of 42.  Albumin was 3.8 but total protein was 6.9.  LDL was 125 and HDL 35.  Macrocytic anemia was present with H/H of 10.1/31.4 and MCV of 101.9.  B12 level was 197, down from prior values of 421 on 04/25/2022.  On 04/25/2022 folate level had been 3.2.  On that same date TSH had been 0.243; but follow-up value was 8.057 on 11/07/2022.  She is on 100 mcg of L-thyroxine.  Last Neurology follow-up was 04/17/2022.;  Keppra could not be increased at that time because of renal issues.  Depakote was added and Vimpat discontinued.  Review of systems: She describes intermittent discomfort in her shoulders and elbows.  She states that she was diagnosed with rheumatoid arthritis and was previously on prednisone.  She has a rash over the left upper posterior thorax which itches intermittently.  She also has intermittent constipation.  Her major focus was on her 2 strokes that she had in 2022 in 2023.  She denies any residual deficit related to this.  She  made the statement she is looking forward to going home.  Apparently at 1 point after the CVAs she had considered Hospice.  Constitutional: No fever, significant weight change, fatigue  Eyes: No redness, discharge, pain, vision change ENT/mouth: No nasal congestion,  purulent discharge, earache, change in hearing, sore throat  Cardiovascular: No chest pain, palpitations, paroxysmal nocturnal dyspnea, claudication, edema  Respiratory: No cough, sputum production, hemoptysis, DOE, significant snoring, apnea   Gastrointestinal: No heartburn, dysphagia, abdominal pain, nausea /vomiting, rectal bleeding, melena Genitourinary: No dysuria, hematuria, pyuria, incontinence, nocturia Neurologic: No dizziness, headache, syncope, seizures, numbness, tingling Psychiatric: No significant anxiety, depression, insomnia, anorexia Endocrine: No change in hair/skin/nails, excessive thirst, excessive hunger, excessive urination  Hematologic/lymphatic: No significant bruising, lymphadenopathy, abnormal bleeding Allergy/immunology: No itchy/watery eyes, significant sneezing, urticaria, angioedema  Physical exam:  Pertinent or positive findings: It was almost 12 noon as she was asleep in bed.  She was easily aroused.  She is thin but appears adequately nourished.  She is missing the anterior maxillary teeth.  There is slight increase in S1.  Pedal pulses are decreased.  She has minimal swan-neck deformity of the fifth fingers bilaterally.  There is some interosseous wasting.  Strength to opposition is good.  There is an irregular hyperpigmented rash over the left upper posterior thorax from the midline to the left scapular area.  She exhibits a slight tremor of the hands.  General appearance:no acute distress, increased work of breathing  is present.   Lymphatic: No lymphadenopathy about the head, neck, axilla. Eyes: No conjunctival inflammation or lid edema is present. There is no scleral icterus. Ears:  External ear  exam shows no significant lesions or deformities.   Nose:  External nasal examination shows no deformity or inflammation. Nasal mucosa are pink and moist without lesions, exudates Oral exam:  Lips and gums are healthy appearing. There is no oropharyngeal erythema or exudate. Neck:  No thyromegaly, masses, tenderness noted.    Heart:  Normal rate and regular rhythm. S2 normal without gallop, murmur, click, rub .  Lungs: Chest clear to auscultation without wheezes, rhonchi, rales, rubs. Abdomen: Bowel sounds are normal. Abdomen is soft and nontender with no organomegaly, hernias, masses. GU: Deferred  Extremities:  No cyanosis, clubbing, edema  Neurologic exam :Strength equal  in upper & lower extremities Deep tendon reflexes are equal Skin: Warm & dry w/o tenting.  See summary under each active problem in the Problem List with associated updated therapeutic plan

## 2022-12-09 NOTE — Patient Instructions (Signed)
See assessment and plan under each diagnosis in the problem list and acutely for this visit 

## 2022-12-09 NOTE — Assessment & Plan Note (Addendum)
BP controlled w/o antihypertensive medications.  No change indicated.  

## 2022-12-11 DIAGNOSIS — I639 Cerebral infarction, unspecified: Secondary | ICD-10-CM | POA: Diagnosis not present

## 2022-12-11 DIAGNOSIS — R569 Unspecified convulsions: Secondary | ICD-10-CM | POA: Diagnosis not present

## 2022-12-11 DIAGNOSIS — I69398 Other sequelae of cerebral infarction: Secondary | ICD-10-CM | POA: Diagnosis not present

## 2022-12-11 DIAGNOSIS — R488 Other symbolic dysfunctions: Secondary | ICD-10-CM | POA: Diagnosis not present

## 2022-12-11 DIAGNOSIS — M3210 Systemic lupus erythematosus, organ or system involvement unspecified: Secondary | ICD-10-CM | POA: Diagnosis not present

## 2022-12-11 DIAGNOSIS — M6281 Muscle weakness (generalized): Secondary | ICD-10-CM | POA: Diagnosis not present

## 2022-12-11 DIAGNOSIS — R627 Adult failure to thrive: Secondary | ICD-10-CM | POA: Diagnosis not present

## 2022-12-11 DIAGNOSIS — R2681 Unsteadiness on feet: Secondary | ICD-10-CM | POA: Diagnosis not present

## 2022-12-11 DIAGNOSIS — R262 Difficulty in walking, not elsewhere classified: Secondary | ICD-10-CM | POA: Diagnosis not present

## 2022-12-12 DIAGNOSIS — R627 Adult failure to thrive: Secondary | ICD-10-CM | POA: Diagnosis not present

## 2022-12-12 DIAGNOSIS — R2681 Unsteadiness on feet: Secondary | ICD-10-CM | POA: Diagnosis not present

## 2022-12-12 DIAGNOSIS — M3210 Systemic lupus erythematosus, organ or system involvement unspecified: Secondary | ICD-10-CM | POA: Diagnosis not present

## 2022-12-12 DIAGNOSIS — R488 Other symbolic dysfunctions: Secondary | ICD-10-CM | POA: Diagnosis not present

## 2022-12-12 DIAGNOSIS — M6281 Muscle weakness (generalized): Secondary | ICD-10-CM | POA: Diagnosis not present

## 2022-12-12 DIAGNOSIS — I69398 Other sequelae of cerebral infarction: Secondary | ICD-10-CM | POA: Diagnosis not present

## 2022-12-12 DIAGNOSIS — R262 Difficulty in walking, not elsewhere classified: Secondary | ICD-10-CM | POA: Diagnosis not present

## 2022-12-12 DIAGNOSIS — R569 Unspecified convulsions: Secondary | ICD-10-CM | POA: Diagnosis not present

## 2022-12-12 DIAGNOSIS — I639 Cerebral infarction, unspecified: Secondary | ICD-10-CM | POA: Diagnosis not present

## 2022-12-13 DIAGNOSIS — M6281 Muscle weakness (generalized): Secondary | ICD-10-CM | POA: Diagnosis not present

## 2022-12-13 DIAGNOSIS — R627 Adult failure to thrive: Secondary | ICD-10-CM | POA: Diagnosis not present

## 2022-12-13 DIAGNOSIS — R569 Unspecified convulsions: Secondary | ICD-10-CM | POA: Diagnosis not present

## 2022-12-13 DIAGNOSIS — R488 Other symbolic dysfunctions: Secondary | ICD-10-CM | POA: Diagnosis not present

## 2022-12-13 DIAGNOSIS — I69398 Other sequelae of cerebral infarction: Secondary | ICD-10-CM | POA: Diagnosis not present

## 2022-12-13 DIAGNOSIS — R2681 Unsteadiness on feet: Secondary | ICD-10-CM | POA: Diagnosis not present

## 2022-12-13 DIAGNOSIS — M3210 Systemic lupus erythematosus, organ or system involvement unspecified: Secondary | ICD-10-CM | POA: Diagnosis not present

## 2022-12-13 DIAGNOSIS — I639 Cerebral infarction, unspecified: Secondary | ICD-10-CM | POA: Diagnosis not present

## 2022-12-13 DIAGNOSIS — R262 Difficulty in walking, not elsewhere classified: Secondary | ICD-10-CM | POA: Diagnosis not present

## 2022-12-15 DIAGNOSIS — R488 Other symbolic dysfunctions: Secondary | ICD-10-CM | POA: Diagnosis not present

## 2022-12-15 DIAGNOSIS — I69398 Other sequelae of cerebral infarction: Secondary | ICD-10-CM | POA: Diagnosis not present

## 2022-12-15 DIAGNOSIS — M3210 Systemic lupus erythematosus, organ or system involvement unspecified: Secondary | ICD-10-CM | POA: Diagnosis not present

## 2022-12-15 DIAGNOSIS — R262 Difficulty in walking, not elsewhere classified: Secondary | ICD-10-CM | POA: Diagnosis not present

## 2022-12-15 DIAGNOSIS — R569 Unspecified convulsions: Secondary | ICD-10-CM | POA: Diagnosis not present

## 2022-12-15 DIAGNOSIS — I639 Cerebral infarction, unspecified: Secondary | ICD-10-CM | POA: Diagnosis not present

## 2022-12-15 DIAGNOSIS — M6281 Muscle weakness (generalized): Secondary | ICD-10-CM | POA: Diagnosis not present

## 2022-12-15 DIAGNOSIS — R2681 Unsteadiness on feet: Secondary | ICD-10-CM | POA: Diagnosis not present

## 2022-12-15 DIAGNOSIS — R627 Adult failure to thrive: Secondary | ICD-10-CM | POA: Diagnosis not present

## 2022-12-16 DIAGNOSIS — I69398 Other sequelae of cerebral infarction: Secondary | ICD-10-CM | POA: Diagnosis not present

## 2022-12-16 DIAGNOSIS — M6281 Muscle weakness (generalized): Secondary | ICD-10-CM | POA: Diagnosis not present

## 2022-12-16 DIAGNOSIS — R262 Difficulty in walking, not elsewhere classified: Secondary | ICD-10-CM | POA: Diagnosis not present

## 2022-12-16 DIAGNOSIS — M3210 Systemic lupus erythematosus, organ or system involvement unspecified: Secondary | ICD-10-CM | POA: Diagnosis not present

## 2022-12-17 DIAGNOSIS — R262 Difficulty in walking, not elsewhere classified: Secondary | ICD-10-CM | POA: Diagnosis not present

## 2022-12-17 DIAGNOSIS — M6281 Muscle weakness (generalized): Secondary | ICD-10-CM | POA: Diagnosis not present

## 2022-12-17 DIAGNOSIS — M3210 Systemic lupus erythematosus, organ or system involvement unspecified: Secondary | ICD-10-CM | POA: Diagnosis not present

## 2022-12-17 DIAGNOSIS — I69398 Other sequelae of cerebral infarction: Secondary | ICD-10-CM | POA: Diagnosis not present

## 2022-12-18 DIAGNOSIS — I69398 Other sequelae of cerebral infarction: Secondary | ICD-10-CM | POA: Diagnosis not present

## 2022-12-18 DIAGNOSIS — M3210 Systemic lupus erythematosus, organ or system involvement unspecified: Secondary | ICD-10-CM | POA: Diagnosis not present

## 2022-12-18 DIAGNOSIS — R262 Difficulty in walking, not elsewhere classified: Secondary | ICD-10-CM | POA: Diagnosis not present

## 2022-12-18 DIAGNOSIS — M6281 Muscle weakness (generalized): Secondary | ICD-10-CM | POA: Diagnosis not present

## 2022-12-19 DIAGNOSIS — M3210 Systemic lupus erythematosus, organ or system involvement unspecified: Secondary | ICD-10-CM | POA: Diagnosis not present

## 2022-12-19 DIAGNOSIS — R262 Difficulty in walking, not elsewhere classified: Secondary | ICD-10-CM | POA: Diagnosis not present

## 2022-12-19 DIAGNOSIS — M6281 Muscle weakness (generalized): Secondary | ICD-10-CM | POA: Diagnosis not present

## 2022-12-19 DIAGNOSIS — I69398 Other sequelae of cerebral infarction: Secondary | ICD-10-CM | POA: Diagnosis not present

## 2022-12-23 DIAGNOSIS — I69398 Other sequelae of cerebral infarction: Secondary | ICD-10-CM | POA: Diagnosis not present

## 2022-12-23 DIAGNOSIS — M3210 Systemic lupus erythematosus, organ or system involvement unspecified: Secondary | ICD-10-CM | POA: Diagnosis not present

## 2022-12-23 DIAGNOSIS — M6281 Muscle weakness (generalized): Secondary | ICD-10-CM | POA: Diagnosis not present

## 2022-12-23 DIAGNOSIS — R262 Difficulty in walking, not elsewhere classified: Secondary | ICD-10-CM | POA: Diagnosis not present

## 2022-12-24 DIAGNOSIS — R262 Difficulty in walking, not elsewhere classified: Secondary | ICD-10-CM | POA: Diagnosis not present

## 2022-12-24 DIAGNOSIS — I69398 Other sequelae of cerebral infarction: Secondary | ICD-10-CM | POA: Diagnosis not present

## 2022-12-24 DIAGNOSIS — M3210 Systemic lupus erythematosus, organ or system involvement unspecified: Secondary | ICD-10-CM | POA: Diagnosis not present

## 2022-12-24 DIAGNOSIS — M6281 Muscle weakness (generalized): Secondary | ICD-10-CM | POA: Diagnosis not present

## 2022-12-25 DIAGNOSIS — M6281 Muscle weakness (generalized): Secondary | ICD-10-CM | POA: Diagnosis not present

## 2022-12-25 DIAGNOSIS — I69398 Other sequelae of cerebral infarction: Secondary | ICD-10-CM | POA: Diagnosis not present

## 2022-12-25 DIAGNOSIS — M3210 Systemic lupus erythematosus, organ or system involvement unspecified: Secondary | ICD-10-CM | POA: Diagnosis not present

## 2022-12-25 DIAGNOSIS — R262 Difficulty in walking, not elsewhere classified: Secondary | ICD-10-CM | POA: Diagnosis not present

## 2023-01-06 DIAGNOSIS — I739 Peripheral vascular disease, unspecified: Secondary | ICD-10-CM | POA: Diagnosis not present

## 2023-01-06 DIAGNOSIS — M2042 Other hammer toe(s) (acquired), left foot: Secondary | ICD-10-CM | POA: Diagnosis not present

## 2023-01-06 DIAGNOSIS — M2041 Other hammer toe(s) (acquired), right foot: Secondary | ICD-10-CM | POA: Diagnosis not present

## 2023-01-06 DIAGNOSIS — B351 Tinea unguium: Secondary | ICD-10-CM | POA: Diagnosis not present

## 2023-01-14 ENCOUNTER — Non-Acute Institutional Stay (SKILLED_NURSING_FACILITY): Payer: Medicare Other | Admitting: Adult Health

## 2023-01-14 ENCOUNTER — Encounter: Payer: Self-pay | Admitting: Adult Health

## 2023-01-14 DIAGNOSIS — M0579 Rheumatoid arthritis with rheumatoid factor of multiple sites without organ or systems involvement: Secondary | ICD-10-CM | POA: Diagnosis not present

## 2023-01-14 DIAGNOSIS — R569 Unspecified convulsions: Secondary | ICD-10-CM

## 2023-01-14 DIAGNOSIS — I69398 Other sequelae of cerebral infarction: Secondary | ICD-10-CM | POA: Diagnosis not present

## 2023-01-14 DIAGNOSIS — E43 Unspecified severe protein-calorie malnutrition: Secondary | ICD-10-CM | POA: Diagnosis not present

## 2023-01-14 DIAGNOSIS — N184 Chronic kidney disease, stage 4 (severe): Secondary | ICD-10-CM

## 2023-01-14 NOTE — Progress Notes (Unsigned)
Location:  [facility] Room Number: 157-W Place of Service:  SNF (31)   CODE STATUS: DNR  No Known Allergies  Chief Complaint  Patient presents with   Medical Management of Chronic Issues         Chronic kidney disease (CKD) stage 4 (severe)  Rheumatoid arthritis involving multiple sites with positive rheumatoid factor:  Seizure as late effect cerebrovascular accident (CVA):  Protein calorie malnutrition severe     HPI:  She is a 67 year old long term resident of this facility being seen for the management of her chronic illnesses:   Chronic kidney disease (CKD) stage 4 (severe)  Rheumatoid arthritis involving multiple sites with positive rheumatoid factor:  Seizure as late effect cerebrovascular accident (CVA):  Protein calorie malnutrition severe. There are no reports of uncontrolled pain. She does get out of bed; using wheelchair. Weight is stable appetite is good.   Past Medical History:  Diagnosis Date   Arthritis    Rheumatoid   Dysphagia    post CVA   Hypertension    Lupus (systemic lupus erythematosus) ([facility])    Seizure ([facility])    Stroke [facility])     Past Surgical History:  Procedure Laterality Date   BIOPSY  02/21/2022   Procedure: BIOPSY;  Surgeon: Lanelle Bal, DO;  [facility];  Service: Endoscopy;;   CESAREAN SECTION     COLONOSCOPY WITH PROPOFOL N/A 02/21/2022   Procedure: COLONOSCOPY WITH PROPOFOL;  Surgeon: Lanelle Bal, DO;  [facility];  Service: Endoscopy;  Laterality: N/A;  2:00pm   ESOPHAGEAL BRUSHING  02/21/2022   Procedure: ESOPHAGEAL BRUSHING;  Surgeon: Lanelle Bal, DO;  [facility];  Service: Endoscopy;;   ESOPHAGOGASTRODUODENOSCOPY (EGD) WITH PROPOFOL N/A 02/21/2022   Procedure: ESOPHAGOGASTRODUODENOSCOPY (EGD) WITH PROPOFOL;  Surgeon: Lanelle Bal, DO;  [facility];  Service: Endoscopy;  Laterality: N/A;   POLYPECTOMY  02/21/2022   Procedure: POLYPECTOMY;  Surgeon:  Lanelle Bal, DO;  [facility];  Service: Endoscopy;;    Social History   Socioeconomic History   Marital status: Single    Spouse name: Not on file   Number of children: Not on file   Years of education: Not on file   Highest education level: Not on file  Occupational History   Not on file  Tobacco Use   Smoking status: Never   Smokeless tobacco: Never  Vaping Use   Vaping Use: Never used  Substance and Sexual Activity   Alcohol use: No    Alcohol/week: 0.0 standard drinks of alcohol   Drug use: No   Sexual activity: Not on file  Other Topics Concern   Not on file  Social History Narrative   Lives with sister.     Social Determinants of Health   Financial Resource Strain: Not on file  Food Insecurity: Not on file  Transportation Needs: Not on file  Physical Activity: Not on file  Stress: Not on file  Social Connections: Not on file  Intimate Partner Violence: Not on file   Family History  Problem Relation Age of Onset   Diabetes Mother    Hypertension Mother    Cancer Mother    Stroke Mother    Colon cancer Father    Hypertension Sister    Hypertension Sister    Hypertension Sister    Hypertension Sister    Hypertension Sister    Hypertension Sister    Hypertension  Brother    Hypertension Brother       VITAL SIGNS BP 110/70   Pulse 97   Temp (!) 97.1 F (36.2 C)   Resp 18   Ht 5\' 6"  (1.676 m)   Wt 139 lb 12.8 oz (63.4 kg)   SpO2 98%   BMI 22.56 kg/m   Outpatient Encounter Medications as of 01/14/2023  Medication Sig   acetaminophen (TYLENOL) 325 MG tablet Take 650 mg by mouth every 6 (six) hours as needed.   Amino Acids-Protein Hydrolys (FEEDING SUPPLEMENT, PRO-STAT SUGAR FREE 64,) LIQD Take 30 mLs by mouth 3 (three) times daily with meals.   cyanocobalamin (VITAMIN B12) 1000 MCG tablet Take 1,000 mcg by mouth daily.   Ensure Plus (ENSURE PLUS) LIQD Take 237 mLs by mouth daily.   levETIRAcetam (KEPPRA) 100 MG/ML solution Take  750 mg by mouth 2 (two) times daily.   levothyroxine (SYNTHROID) 100 MCG tablet Take 100 mcg by mouth daily before breakfast.   LORazepam (ATIVAN) 2 MG/ML injection Inject 0.5 mLs (1 mg total) into the muscle every 15 (fifteen) minutes as needed. Give 1 mg IM very 15 minutes as needed for up to 3 doses; if not relieved after 3rd dose send to the ED   melatonin 3 MG TABS tablet Take 3 mg by mouth at bedtime.   ondansetron (ZOFRAN-ODT) 8 MG disintegrating tablet Take 8 mg by mouth every 6 (six) hours as needed for nausea or vomiting. Before meals and at bedtime   rosuvastatin (CRESTOR) 5 MG tablet Take 5 mg by mouth daily.   sennosides-docusate sodium (SENOKOT-S) 8.6-50 MG tablet Take 1 tablet by mouth 2 (two) times daily.   UNABLE TO FIND Diet: Regular diet. Send small portions.   divalproex (DEPAKOTE) 500 MG DR tablet Take 1 tablet (500 mg total) by mouth 2 (two) times daily.   No facility-administered encounter medications on file as of 01/14/2023.     SIGNIFICANT DIAGNOSTIC EXAMS  PREVIOUS   04-09-22: right humerus x-ray:No acute fracture or dislocation of the humerus or forearm.   04-09-22: pelvic x-ray  No evidence of acute hip fracture. Moderate bilateral hip osteoarthritis.  04-09-22: ct of head:  1.  No acute intracranial process. 2.  Left parietal vertex scalp hematoma.  04-09-22: ct of cervical spine:  No evidence of acute cervical spine fracture or traumatic listhesis.   NO NEW EXAMS    LABS REVIEWED:    12-18-21: wbc 4.3; hgb 8.7; hct 28.8; plt 710; glucose 83; bun 20; creat 1.36;  k+ 4.1; na++ 141; ca 8.8  01-10-22: wbc 4.5; hgb 8.0; hct 25.8; mcv 82.2 plt 603; glucose 76; bun 19; creat 1.28; k+ 3.6; na++ 1140; ca 8.5; gfr 46; protein 7.0; albumin 2.3; chol 98; ldl 48; trig 151; hdl 20; tsh 2.976 free t4: 1.00; iron 23; tibc 135 02-18-22: hgb 7.7; hct 24.7; glucose 47; bun 9; creat 0.80; k+ 3.0; na++ 136; ca 7.6; gfr>60; protein 5.8; albumin 1.9 02-19-22: wbc 3.2; hgb 8.3; hct  26.0; mcv 82.5 plt 141; k+ 2.6  02-20-22: k+ 4.7  03-05-22: keppra level 42.0 (10-40 normal) 03-26-22: wbc 4.2; hgb 8.2; hct 26.3; mcv 86.8 plt 508; glucose 78; bun 9; creat 0.84; k+ 2.4; na++ 136; ca 7.6; gfr>60; protein 6.5; albumin 2.1; tsh 4.826; sed rate: 82 ANA+ 03-28-22: k+ 4.0 03-31-22: RPR nr; HIV nr 04-09-22: wbc 6.3; hgb 9.2 hct 30.1 mcv 91.8 plt 440; glucose 101; bun 12; creat 1.17; k + 3.5; na++ 134; ca 8.2; gfr  51 04-25-22: glucose 75; bun 12; creat 0.92; k+ 2.6; na++ 142; ca 8.0; gfr >60;  protein 5.4; albumin 2.4. depakote 86; vitamin B 12: 421; folate 3.2; iron 33; tibc 127. 04-26-22: k+ 4.4   TODAY  11-07-22: wbc 2.9; hgb 10.1; hct 31.4; mcv 101.9 plt 213;glucose 77; bun 25; creat 1.37; k+ 3.8; na++ 137; ca 8.5; gfr 42; protein 6.9 albumin 2.8; tsh 8.057; free t4: 0.75; keppra 37.7; iron 54; tibc 235; hepatitis C nr; depakote 71 keppra 37.7 chol 172 ldl 125; trig 62; hdl 35 11-14-22: keppra 34.9; vitamin B 12 197   Review of Systems  Constitutional:  Negative for malaise/fatigue.  Respiratory:  Negative for cough and shortness of breath.   Cardiovascular:  Negative for chest pain, palpitations and leg swelling.  Gastrointestinal:  Negative for abdominal pain, constipation and heartburn.  Musculoskeletal:  Negative for back pain, joint pain and myalgias.  Skin: Negative.   Neurological:  Negative for dizziness.  Psychiatric/Behavioral:  The patient is not nervous/anxious.    Physical Exam Constitutional:      General: She is not in acute distress.    Appearance: She is well-developed. She is not diaphoretic.  Neck:     Thyroid: No thyromegaly.  Cardiovascular:     Rate and Rhythm: Normal rate and regular rhythm.     Pulses: Normal pulses.     Heart sounds: Normal heart sounds.  Pulmonary:     Effort: Pulmonary effort is normal. No respiratory distress.     Breath sounds: Normal breath sounds.  Abdominal:     General: Bowel sounds are normal. There is no distension.      Palpations: Abdomen is soft.     Tenderness: There is no abdominal tenderness.  Musculoskeletal:        General: Normal range of motion.     Cervical back: Neck supple.     Right lower leg: No edema.     Left lower leg: No edema.     Comments: Has neck contracture   Lymphadenopathy:     Cervical: No cervical adenopathy.  Skin:    General: Skin is warm and dry.  Neurological:     Mental Status: She is alert and oriented to person, place, and time.  Psychiatric:        Mood and Affect: Mood normal.       ASSESSMENT/ PLAN:  TODAY  Chronic kidney disease (CKD) stage 4 (severe) bun 25; creat 1.37; gfr 42  2. Rheumatoid arthritis involving multiple sites with positive rheumatoid factor: is off steroids  3. 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 Will continue keppra 750 mg twice daily and depakote 500 mg twice daily   4. Protein calorie malnutrition severe: protein 6.5 albumin 2.1 will continue prostat with meals and supplements as directed   PREVIOUS   5. Benign essential hypertension: b/p 110/70 is off norvasc and atenolol  6. Systemic lupus erythematous: is off steroids  7. GERD without esophagitis: is off protonix   8. Chronic constipation: will continue senna s twice daily   9. Major depression recurrent chronic: is off zoloft weight is 139 pounds.   10. Chronic anemia: hgb 10.1  11. Thrombocytosis: plt 213  12. Hypothyroidism: tsh 8.057 will continue  synthroid 100 mcg daily   13. Vitamin B 12 deficiency: level 197 will continue 1,000 mcg daily   14. Hyperlipidemia LDL goal < 70: ldl 125 will continue crestor 5 mg daily   15. History  of CVA: is off asa and brilanta  16. Neurocognitive deficits: will monitor     Synthia Innocent NP [facility]   call [phone]

## 2023-01-24 DIAGNOSIS — I69398 Other sequelae of cerebral infarction: Secondary | ICD-10-CM | POA: Diagnosis not present

## 2023-01-24 DIAGNOSIS — R488 Other symbolic dysfunctions: Secondary | ICD-10-CM | POA: Diagnosis not present

## 2023-01-27 DIAGNOSIS — R488 Other symbolic dysfunctions: Secondary | ICD-10-CM | POA: Diagnosis not present

## 2023-01-27 DIAGNOSIS — I69398 Other sequelae of cerebral infarction: Secondary | ICD-10-CM | POA: Diagnosis not present

## 2023-01-28 DIAGNOSIS — I69398 Other sequelae of cerebral infarction: Secondary | ICD-10-CM | POA: Diagnosis not present

## 2023-01-28 DIAGNOSIS — R488 Other symbolic dysfunctions: Secondary | ICD-10-CM | POA: Diagnosis not present

## 2023-01-29 DIAGNOSIS — R488 Other symbolic dysfunctions: Secondary | ICD-10-CM | POA: Diagnosis not present

## 2023-01-29 DIAGNOSIS — I69398 Other sequelae of cerebral infarction: Secondary | ICD-10-CM | POA: Diagnosis not present

## 2023-01-30 DIAGNOSIS — I69398 Other sequelae of cerebral infarction: Secondary | ICD-10-CM | POA: Diagnosis not present

## 2023-01-30 DIAGNOSIS — R488 Other symbolic dysfunctions: Secondary | ICD-10-CM | POA: Diagnosis not present

## 2023-01-31 DIAGNOSIS — R488 Other symbolic dysfunctions: Secondary | ICD-10-CM | POA: Diagnosis not present

## 2023-01-31 DIAGNOSIS — I69398 Other sequelae of cerebral infarction: Secondary | ICD-10-CM | POA: Diagnosis not present

## 2023-02-03 DIAGNOSIS — R488 Other symbolic dysfunctions: Secondary | ICD-10-CM | POA: Diagnosis not present

## 2023-02-03 DIAGNOSIS — I69398 Other sequelae of cerebral infarction: Secondary | ICD-10-CM | POA: Diagnosis not present

## 2023-02-05 DIAGNOSIS — R488 Other symbolic dysfunctions: Secondary | ICD-10-CM | POA: Diagnosis not present

## 2023-02-05 DIAGNOSIS — I69398 Other sequelae of cerebral infarction: Secondary | ICD-10-CM | POA: Diagnosis not present

## 2023-02-06 DIAGNOSIS — R488 Other symbolic dysfunctions: Secondary | ICD-10-CM | POA: Diagnosis not present

## 2023-02-06 DIAGNOSIS — I69398 Other sequelae of cerebral infarction: Secondary | ICD-10-CM | POA: Diagnosis not present

## 2023-02-07 ENCOUNTER — Encounter: Payer: Self-pay | Admitting: Adult Health

## 2023-02-07 ENCOUNTER — Non-Acute Institutional Stay (SKILLED_NURSING_FACILITY): Payer: Medicare Other | Admitting: Adult Health

## 2023-02-07 DIAGNOSIS — N184 Chronic kidney disease, stage 4 (severe): Secondary | ICD-10-CM

## 2023-02-07 DIAGNOSIS — R488 Other symbolic dysfunctions: Secondary | ICD-10-CM | POA: Diagnosis not present

## 2023-02-07 DIAGNOSIS — M0579 Rheumatoid arthritis with rheumatoid factor of multiple sites without organ or systems involvement: Secondary | ICD-10-CM

## 2023-02-07 DIAGNOSIS — R569 Unspecified convulsions: Secondary | ICD-10-CM

## 2023-02-07 DIAGNOSIS — I69398 Other sequelae of cerebral infarction: Secondary | ICD-10-CM | POA: Diagnosis not present

## 2023-02-07 NOTE — Progress Notes (Unsigned)
Location:  [facility] Room Number: 157W Place of Service:  SNF (31)   CODE STATUS: DNR  No Known Allergies  Chief Complaint  Patient presents with   Acute Visit    Patient is being seen for acute care plan   Quality Metric Gaps    Discussed the need for AWV and Hep C vaccine     HPI:    Past Medical History:  Diagnosis Date   Arthritis    Rheumatoid   Dysphagia    post CVA   Hypertension    Lupus (systemic lupus erythematosus) (HCC)    Seizure (HCC)    Stroke [facility])     Past Surgical History:  Procedure Laterality Date   BIOPSY  02/21/2022   Procedure: BIOPSY;  Surgeon: Lanelle Bal, DO;  [facility];  Service: Endoscopy;;   CESAREAN SECTION     COLONOSCOPY WITH PROPOFOL N/A 02/21/2022   Procedure: COLONOSCOPY WITH PROPOFOL;  Surgeon: Lanelle Bal, DO;  [facility];  Service: Endoscopy;  Laterality: N/A;  2:00pm   ESOPHAGEAL BRUSHING  02/21/2022   Procedure: ESOPHAGEAL BRUSHING;  Surgeon: Lanelle Bal, DO;  [facility];  Service: Endoscopy;;   ESOPHAGOGASTRODUODENOSCOPY (EGD) WITH PROPOFOL N/A 02/21/2022   Procedure: ESOPHAGOGASTRODUODENOSCOPY (EGD) WITH PROPOFOL;  Surgeon: Lanelle Bal, DO;  [facility];  Service: Endoscopy;  Laterality: N/A;   POLYPECTOMY  02/21/2022   Procedure: POLYPECTOMY;  Surgeon: Lanelle Bal, DO;  [facility];  Service: Endoscopy;;    Social History   Socioeconomic History   Marital status: Single    Spouse name: Not on file   Number of children: Not on file   Years of education: Not on file   Highest education level: Not on file  Occupational History   Not on file  Tobacco Use   Smoking status: Never   Smokeless tobacco: Never  Vaping Use   Vaping Use: Never used  Substance and Sexual Activity   Alcohol use: No    Alcohol/week: 0.0 standard drinks of alcohol   Drug use: No   Sexual activity: Not on file  Other Topics Concern    Not on file  Social History Narrative   Lives with sister.     Social Determinants of Health   Financial Resource Strain: Not on file  Food Insecurity: Not on file  Transportation Needs: Not on file  Physical Activity: Not on file  Stress: Not on file  Social Connections: Not on file  Intimate Partner Violence: Not on file   Family History  Problem Relation Age of Onset   Diabetes Mother    Hypertension Mother    Cancer Mother    Stroke Mother    Colon cancer Father    Hypertension Sister    Hypertension Sister    Hypertension Sister    Hypertension Sister    Hypertension Sister    Hypertension Sister    Hypertension Brother    Hypertension Brother       VITAL SIGNS BP 116/76   Pulse 94   Temp 98.6 F (37 C) (Temporal)   Resp 18   Ht 5\' 6"  (1.676 m)   Wt 141 lb (64 kg)   SpO2 97%   BMI 22.76 kg/m   Outpatient Encounter Medications as of 02/07/2023  Medication Sig   acetaminophen (TYLENOL) 325 MG tablet Take 650 mg by mouth every 6 (six) hours as needed.   Amino  Acids-Protein Hydrolys (FEEDING SUPPLEMENT, PRO-STAT SUGAR FREE 64,) LIQD Take 30 mLs by mouth 3 (three) times daily with meals.   cyanocobalamin (VITAMIN B12) 1000 MCG tablet Take 1,000 mcg by mouth daily.   Ensure Plus (ENSURE PLUS) LIQD Take 237 mLs by mouth daily.   levETIRAcetam (KEPPRA) 100 MG/ML solution Take 750 mg by mouth 2 (two) times daily.   levothyroxine (SYNTHROID) 100 MCG tablet Take 100 mcg by mouth daily before breakfast.   LORazepam (ATIVAN) 2 MG/ML injection Inject 0.5 mLs (1 mg total) into the muscle every 15 (fifteen) minutes as needed. Give 1 mg IM very 15 minutes as needed for up to 3 doses; if not relieved after 3rd dose send to the ED   melatonin 3 MG TABS tablet Take 3 mg by mouth at bedtime.   ondansetron (ZOFRAN-ODT) 8 MG disintegrating tablet Take 8 mg by mouth every 6 (six) hours as needed for nausea or vomiting. Before meals and at bedtime   rosuvastatin (CRESTOR) 5 MG  tablet Take 5 mg by mouth daily.   sennosides-docusate sodium (SENOKOT-S) 8.6-50 MG tablet Take 1 tablet by mouth 2 (two) times daily.   UNABLE TO FIND Diet: Regular diet. Send small portions.   divalproex (DEPAKOTE) 500 MG DR tablet Take 1 tablet (500 mg total) by mouth 2 (two) times daily.   No facility-administered encounter medications on file as of 02/07/2023.     SIGNIFICANT DIAGNOSTIC EXAMS       ASSESSMENT/ PLAN:     Synthia Innocent NP [facility]  Contact [phone] Monday through Friday 8am- 5pm  After hours call [phone]

## 2023-02-08 DIAGNOSIS — R488 Other symbolic dysfunctions: Secondary | ICD-10-CM | POA: Diagnosis not present

## 2023-02-08 DIAGNOSIS — I69398 Other sequelae of cerebral infarction: Secondary | ICD-10-CM | POA: Diagnosis not present

## 2023-02-10 DIAGNOSIS — I69398 Other sequelae of cerebral infarction: Secondary | ICD-10-CM | POA: Diagnosis not present

## 2023-02-10 DIAGNOSIS — R488 Other symbolic dysfunctions: Secondary | ICD-10-CM | POA: Diagnosis not present

## 2023-02-11 DIAGNOSIS — R488 Other symbolic dysfunctions: Secondary | ICD-10-CM | POA: Diagnosis not present

## 2023-02-11 DIAGNOSIS — I69398 Other sequelae of cerebral infarction: Secondary | ICD-10-CM | POA: Diagnosis not present

## 2023-02-12 ENCOUNTER — Encounter: Payer: Self-pay | Admitting: Adult Health

## 2023-02-12 ENCOUNTER — Non-Acute Institutional Stay (SKILLED_NURSING_FACILITY): Payer: Medicare Other | Admitting: Adult Health

## 2023-02-12 DIAGNOSIS — K219 Gastro-esophageal reflux disease without esophagitis: Secondary | ICD-10-CM | POA: Diagnosis not present

## 2023-02-12 DIAGNOSIS — M329 Systemic lupus erythematosus, unspecified: Secondary | ICD-10-CM

## 2023-02-12 DIAGNOSIS — I10 Essential (primary) hypertension: Secondary | ICD-10-CM

## 2023-02-12 DIAGNOSIS — K5909 Other constipation: Secondary | ICD-10-CM

## 2023-02-12 NOTE — Progress Notes (Signed)
Location:  [facility] Room Number: 157W Place of Service:  SNF (31)   CODE STATUS: DNR  No Known Allergies  Chief Complaint  Patient presents with   Medical Management of Chronic Issues                 Benign essential hypertension: Systemic lupus erythematous; GERD without esophagitis:  Chronic constipation:     HPI:  She is a 67 year old long term resident of this facility being seen for the management of her chronic illnesses: Benign essential hypertension: Systemic lupus erythematous; GERD without esophagitis:  Chronic constipation:. There are no reports of uncontrolled pain. Her weight is improving.   Past Medical History:  Diagnosis Date   Arthritis    Rheumatoid   Dysphagia    post CVA   Hypertension    Lupus (systemic lupus erythematosus) (HCC)    Seizure (HCC)    Stroke [facility])     Past Surgical History:  Procedure Laterality Date   BIOPSY  02/21/2022   Procedure: BIOPSY;  Surgeon: Lanelle Bal, DO;  [facility];  Service: Endoscopy;;   CESAREAN SECTION     COLONOSCOPY WITH PROPOFOL N/A 02/21/2022   Procedure: COLONOSCOPY WITH PROPOFOL;  Surgeon: Lanelle Bal, DO;  [facility];  Service: Endoscopy;  Laterality: N/A;  2:00pm   ESOPHAGEAL BRUSHING  02/21/2022   Procedure: ESOPHAGEAL BRUSHING;  Surgeon: Lanelle Bal, DO;  [facility];  Service: Endoscopy;;   ESOPHAGOGASTRODUODENOSCOPY (EGD) WITH PROPOFOL N/A 02/21/2022   Procedure: ESOPHAGOGASTRODUODENOSCOPY (EGD) WITH PROPOFOL;  Surgeon: Lanelle Bal, DO;  [facility];  Service: Endoscopy;  Laterality: N/A;   POLYPECTOMY  02/21/2022   Procedure: POLYPECTOMY;  Surgeon: Lanelle Bal, DO;  [facility];  Service: Endoscopy;;    Social History   Socioeconomic History   Marital status: Single    Spouse name: Not on file   Number of children: Not on file   Years of education: Not on file   Highest education level:  Not on file  Occupational History   Not on file  Tobacco Use   Smoking status: Never   Smokeless tobacco: Never  Vaping Use   Vaping Use: Never used  Substance and Sexual Activity   Alcohol use: No    Alcohol/week: 0.0 standard drinks of alcohol   Drug use: No   Sexual activity: Not on file  Other Topics Concern   Not on file  Social History Narrative   Lives with sister.     Social Determinants of Health   Financial Resource Strain: Not on file  Food Insecurity: Not on file  Transportation Needs: Not on file  Physical Activity: Not on file  Stress: Not on file  Social Connections: Not on file  Intimate Partner Violence: Not on file   Family History  Problem Relation Age of Onset   Diabetes Mother    Hypertension Mother    Cancer Mother    Stroke Mother    Colon cancer Father    Hypertension Sister    Hypertension Sister    Hypertension Sister    Hypertension Sister    Hypertension Sister    Hypertension Sister    Hypertension Brother    Hypertension Brother       VITAL SIGNS BP 137/72   Pulse 72   Temp 97.8 F (36.6 C)   Resp 20   Ht 5\' 6"  (1.676 m)  Wt 141 lb (64 kg)   SpO2 99%   BMI 22.76 kg/m   Outpatient Encounter Medications as of 02/12/2023  Medication Sig   acetaminophen (TYLENOL) 325 MG tablet Take 650 mg by mouth every 6 (six) hours as needed.   Amino Acids-Protein Hydrolys (FEEDING SUPPLEMENT, PRO-STAT SUGAR FREE 64,) LIQD Take 30 mLs by mouth 3 (three) times daily with meals.   cyanocobalamin (VITAMIN B12) 1000 MCG tablet Take 1,000 mcg by mouth daily.   divalproex (DEPAKOTE) 500 MG DR tablet Take 1 tablet (500 mg total) by mouth 2 (two) times daily.   Ensure Plus (ENSURE PLUS) LIQD Take 237 mLs by mouth daily.   levETIRAcetam (KEPPRA) 100 MG/ML solution Take 750 mg by mouth 2 (two) times daily.   levothyroxine (SYNTHROID) 100 MCG tablet Take 100 mcg by mouth daily before breakfast.   LORazepam (ATIVAN) 2 MG/ML injection Inject 0.5 mLs  (1 mg total) into the muscle every 15 (fifteen) minutes as needed. Give 1 mg IM very 15 minutes as needed for up to 3 doses; if not relieved after 3rd dose send to the ED   melatonin 3 MG TABS tablet Take 3 mg by mouth at bedtime.   ondansetron (ZOFRAN-ODT) 8 MG disintegrating tablet Take 8 mg by mouth every 6 (six) hours as needed for nausea or vomiting. Before meals and at bedtime   rosuvastatin (CRESTOR) 5 MG tablet Take 5 mg by mouth daily.   sennosides-docusate sodium (SENOKOT-S) 8.6-50 MG tablet Take 1 tablet by mouth 2 (two) times daily.   UNABLE TO FIND Diet: Regular diet. Send small portions.   No facility-administered encounter medications on file as of 02/12/2023.     SIGNIFICANT DIAGNOSTIC EXAMS  PREVIOUS   04-09-22: right humerus x-ray:No acute fracture or dislocation of the humerus or forearm.   04-09-22: pelvic x-ray  No evidence of acute hip fracture. Moderate bilateral hip osteoarthritis.  04-09-22: ct of head:  1.  No acute intracranial process. 2.  Left parietal vertex scalp hematoma.  04-09-22: ct of cervical spine:  No evidence of acute cervical spine fracture or traumatic listhesis.   NO NEW EXAMS    LABS REVIEWED:    02-18-22: hgb 7.7; hct 24.7; glucose 47; bun 9; creat 0.80; k+ 3.0; na++ 136; ca 7.6; gfr>60; protein 5.8; albumin 1.9 02-19-22: wbc 3.2; hgb 8.3; hct 26.0; mcv 82.5 plt 141; k+ 2.6  02-20-22: k+ 4.7  03-05-22: keppra level 42.0 (10-40 normal) 03-26-22: wbc 4.2; hgb 8.2; hct 26.3; mcv 86.8 plt 508; glucose 78; bun 9; creat 0.84; k+ 2.4; na++ 136; ca 7.6; gfr>60; protein 6.5; albumin 2.1; tsh 4.826; sed rate: 82 ANA+ 03-28-22: k+ 4.0 03-31-22: RPR nr; HIV nr 04-09-22: wbc 6.3; hgb 9.2 hct 30.1 mcv 91.8 plt 440; glucose 101; bun 12; creat 1.17; k + 3.5; na++ 134; ca 8.2; gfr 51 04-25-22: glucose 75; bun 12; creat 0.92; k+ 2.6; na++ 142; ca 8.0; gfr >60;  protein 5.4; albumin 2.4. depakote 86; vitamin B 12: 421; folate 3.2; iron 33; tibc 127. 04-26-22: k+ 4.4   11-07-22: wbc 2.9; hgb 10.1; hct 31.4; mcv 101.9 plt 213;glucose 77; bun 25; creat 1.37; k+ 3.8; na++ 137; ca 8.5; gfr 42; protein 6.9 albumin 2.8; tsh 8.057; free t4: 0.75; keppra 37.7; iron 54; tibc 235; hepatitis C nr; depakote 71 keppra 37.7 chol 172 ldl 125; trig 62; hdl 35 11-14-22: keppra 34.9; vitamin B 12 197   NO NEW LABS.   Review of Systems  Constitutional:  Negative  for malaise/fatigue.  Respiratory:  Negative for cough and shortness of breath.   Cardiovascular:  Negative for chest pain, palpitations and leg swelling.  Gastrointestinal:  Negative for abdominal pain, constipation and heartburn.  Musculoskeletal:  Negative for back pain, joint pain and myalgias.  Skin: Negative.   Neurological:  Negative for dizziness.  Psychiatric/Behavioral:  The patient is not nervous/anxious.    Physical Exam Constitutional:      General: She is not in acute distress.    Appearance: She is well-developed. She is not diaphoretic.  Neck:     Thyroid: No thyromegaly.  Cardiovascular:     Rate and Rhythm: Normal rate and regular rhythm.     Pulses: Normal pulses.     Heart sounds: Normal heart sounds.  Pulmonary:     Effort: Pulmonary effort is normal. No respiratory distress.     Breath sounds: Normal breath sounds.  Abdominal:     General: Bowel sounds are normal. There is no distension.     Palpations: Abdomen is soft.     Tenderness: There is no abdominal tenderness.  Musculoskeletal:        General: Normal range of motion.     Cervical back: Neck supple.     Right lower leg: No edema.     Left lower leg: No edema.     Comments: Has neck contracture   Lymphadenopathy:     Cervical: No cervical adenopathy.  Skin:    General: Skin is warm and dry.  Neurological:     Mental Status: She is alert. Mental status is at baseline.  Psychiatric:        Mood and Affect: Mood normal.        ASSESSMENT/ PLAN:  TODAY  Benign essential hypertension: b/p 137/72 is off norvasc and  atenolol  2. Systemic lupus erythematous; is off steroids  3. GERD without esophagitis: is off protonix  4. Chronic constipation: will continue senna s twice daily   PREVIOUS   5. Major depression recurrent chronic: is off zoloft weight is 141 pounds.   6. Chronic anemia: hgb 10.1  7. Thrombocytosis: plt 213  8. Hypothyroidism: tsh 8.057 will continue  synthroid 100 mcg daily   9. Vitamin B 12 deficiency: level 197 will continue 1,000 mcg daily   10. Hyperlipidemia LDL goal < 70: ldl 125 will continue crestor 5 mg daily   11. History of CVA: is off asa and brilanta  12. Neurocognitive deficits: will monitor   13. Chronic kidney disease (CKD) stage 4 (severe) bun 25; creat 1.37; gfr 42  14. Rheumatoid arthritis involving multiple sites with positive rheumatoid factor: is off steroids  15. 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 Will continue keppra 750 mg twice daily and depakote 500 mg twice daily   16. Protein calorie malnutrition severe: protein 6.5 albumin 2.1 will continue prostat with meals and supplements as directed         Synthia Innocent NP [facility]  call [phone]

## 2023-02-13 ENCOUNTER — Non-Acute Institutional Stay (SKILLED_NURSING_FACILITY): Payer: Medicare Other | Admitting: Adult Health

## 2023-02-13 ENCOUNTER — Encounter: Payer: Self-pay | Admitting: Adult Health

## 2023-02-13 ENCOUNTER — Other Ambulatory Visit (HOSPITAL_COMMUNITY)
Admission: RE | Admit: 2023-02-13 | Discharge: 2023-02-13 | Disposition: A | Discharge status: Home / Self Care | Payer: Medicare Other | Source: Skilled Nursing Facility | Attending: Adult Health | Admitting: Adult Health

## 2023-02-13 DIAGNOSIS — I10 Essential (primary) hypertension: Secondary | ICD-10-CM | POA: Diagnosis not present

## 2023-02-13 DIAGNOSIS — E785 Hyperlipidemia, unspecified: Secondary | ICD-10-CM | POA: Diagnosis not present

## 2023-02-13 DIAGNOSIS — R488 Other symbolic dysfunctions: Secondary | ICD-10-CM | POA: Diagnosis not present

## 2023-02-13 DIAGNOSIS — I69398 Other sequelae of cerebral infarction: Secondary | ICD-10-CM | POA: Diagnosis not present

## 2023-02-13 DIAGNOSIS — E039 Hypothyroidism, unspecified: Secondary | ICD-10-CM

## 2023-02-13 LAB — LIPID PANEL
Cholesterol: 157 mg/dL (ref 0–200)
HDL: 38 mg/dL — ABNORMAL LOW (ref >40)
LDL Cholesterol: 101 mg/dL — ABNORMAL HIGH (ref 0–99)
Total CHOL/HDL Ratio: 4.1 RATIO
Triglycerides: 91 mg/dL (ref <150)
VLDL: 18 mg/dL (ref 0–40)

## 2023-02-13 LAB — T4, FREE: Free T4: 0.99 ng/dL (ref 0.61–1.12)

## 2023-02-13 LAB — TSH: TSH: 0.123 u[IU]/mL — ABNORMAL LOW (ref 0.350–4.500)

## 2023-02-13 NOTE — Progress Notes (Signed)
Location:  [facility] Room Number: 157-W Place of Service:  SNF (31)   CODE STATUS: DNR  No Known Allergies  Chief Complaint  Patient presents with   Acute Visit    Follow-up    HPI:  She has had her follow up labs done. They revealed a tsh of 0.123 and ldl of 101 (which has improved). Her tsh is slightly low at this time. She denies feeling jittery; no heart palpitations; no insomnia.   Past Medical History:  Diagnosis Date   Arthritis    Rheumatoid   Dysphagia    post CVA   Hypertension    Lupus (systemic lupus erythematosus) (HCC)    Seizure (HCC)    Stroke [facility])     Past Surgical History:  Procedure Laterality Date   BIOPSY  02/21/2022   Procedure: BIOPSY;  Surgeon: Lanelle Bal, DO;  [facility];  Service: Endoscopy;;   CESAREAN SECTION     COLONOSCOPY WITH PROPOFOL N/A 02/21/2022   Procedure: COLONOSCOPY WITH PROPOFOL;  Surgeon: Lanelle Bal, DO;  [facility];  Service: Endoscopy;  Laterality: N/A;  2:00pm   ESOPHAGEAL BRUSHING  02/21/2022   Procedure: ESOPHAGEAL BRUSHING;  Surgeon: Lanelle Bal, DO;  [facility];  Service: Endoscopy;;   ESOPHAGOGASTRODUODENOSCOPY (EGD) WITH PROPOFOL N/A 02/21/2022   Procedure: ESOPHAGOGASTRODUODENOSCOPY (EGD) WITH PROPOFOL;  Surgeon: Lanelle Bal, DO;  [facility];  Service: Endoscopy;  Laterality: N/A;   POLYPECTOMY  02/21/2022   Procedure: POLYPECTOMY;  Surgeon: Lanelle Bal, DO;  [facility];  Service: Endoscopy;;    Social History   Socioeconomic History   Marital status: Single    Spouse name: Not on file   Number of children: Not on file   Years of education: Not on file   Highest education level: Not on file  Occupational History   Not on file  Tobacco Use   Smoking status: Never   Smokeless tobacco: Never  Vaping Use   Vaping Use: Never used  Substance and Sexual Activity   Alcohol use: No    Alcohol/week:  0.0 standard drinks of alcohol   Drug use: No   Sexual activity: Not on file  Other Topics Concern   Not on file  Social History Narrative   Lives with sister.     Social Determinants of Health   Financial Resource Strain: Not on file  Food Insecurity: Not on file  Transportation Needs: Not on file  Physical Activity: Not on file  Stress: Not on file  Social Connections: Not on file  Intimate Partner Violence: Not on file   Family History  Problem Relation Age of Onset   Diabetes Mother    Hypertension Mother    Cancer Mother    Stroke Mother    Colon cancer Father    Hypertension Sister    Hypertension Sister    Hypertension Sister    Hypertension Sister    Hypertension Sister    Hypertension Sister    Hypertension Brother    Hypertension Brother       VITAL SIGNS BP 137/72   Pulse 72   Temp 97.8 F (36.6 C)   Resp 20   Ht 5\' 6"  (1.676 m)   Wt 141 lb (64 kg)   SpO2 99%   BMI 22.76 kg/m   Outpatient Encounter Medications as of 02/13/2023  Medication Sig   acetaminophen (TYLENOL) 325 MG tablet Take 650  mg by mouth every 6 (six) hours as needed.   Amino Acids-Protein Hydrolys (PRO-STAT AWC) LIQD Take by mouth. 17-100 gram-kcal/30 mL; oral Three Times A Day [DX: Unspecified severe protein-calorie malnutrition]   Ensure Plus (ENSURE PLUS) LIQD Take 237 mLs by mouth daily.   levETIRAcetam (KEPPRA) 100 MG/ML solution Take 750 mg by mouth 2 (two) times daily.   LORazepam (ATIVAN) 2 MG/ML injection Inject 0.5 mLs (1 mg total) into the muscle every 15 (fifteen) minutes as needed. Give 1 mg IM very 15 minutes as needed for up to 3 doses; if not relieved after 3rd dose send to the ED   melatonin 5 MG TABS Take 5 mg by mouth at bedtime.   ondansetron (ZOFRAN-ODT) 8 MG disintegrating tablet Take 8 mg by mouth every 6 (six) hours as needed for nausea or vomiting. Before meals and at bedtime   rosuvastatin (CRESTOR) 10 MG tablet Take 10 mg by mouth daily.    sennosides-docusate sodium (SENOKOT-S) 8.6-50 MG tablet Take 1 tablet by mouth 2 (two) times daily.   UNABLE TO FIND Diet: Regular diet. Send small portions.   cyanocobalamin (VITAMIN B12) 1000 MCG tablet Take 1,000 mcg by mouth daily.   divalproex (DEPAKOTE) 500 MG DR tablet Take 1 tablet (500 mg total) by mouth 2 (two) times daily.   [DISCONTINUED] Amino Acids-Protein Hydrolys (FEEDING SUPPLEMENT, PRO-STAT SUGAR FREE 64,) LIQD Take 30 mLs by mouth 3 (three) times daily with meals.   [DISCONTINUED] levothyroxine (SYNTHROID) 100 MCG tablet Take 100 mcg by mouth daily before breakfast.   [DISCONTINUED] melatonin 3 MG TABS tablet Take 3 mg by mouth at bedtime.   [DISCONTINUED] rosuvastatin (CRESTOR) 5 MG tablet Take 5 mg by mouth daily.   No facility-administered encounter medications on file as of 02/13/2023.     SIGNIFICANT DIAGNOSTIC EXAMS  PREVIOUS   04-09-22: right humerus x-ray:No acute fracture or dislocation of the humerus or forearm.   04-09-22: pelvic x-ray  No evidence of acute hip fracture. Moderate bilateral hip osteoarthritis.  04-09-22: ct of head:  1.  No acute intracranial process. 2.  Left parietal vertex scalp hematoma.  04-09-22: ct of cervical spine:  No evidence of acute cervical spine fracture or traumatic listhesis.   NO NEW EXAMS    LABS REVIEWED:    02-18-22: hgb 7.7; hct 24.7; glucose 47; bun 9; creat 0.80; k+ 3.0; na++ 136; ca 7.6; gfr>60; protein 5.8; albumin 1.9 02-19-22: wbc 3.2; hgb 8.3; hct 26.0; mcv 82.5 plt 141; k+ 2.6  02-20-22: k+ 4.7  03-05-22: keppra level 42.0 (10-40 normal) 03-26-22: wbc 4.2; hgb 8.2; hct 26.3; mcv 86.8 plt 508; glucose 78; bun 9; creat 0.84; k+ 2.4; na++ 136; ca 7.6; gfr>60; protein 6.5; albumin 2.1; tsh 4.826; sed rate: 82 ANA+ 03-28-22: k+ 4.0 03-31-22: RPR nr; HIV nr 04-09-22: wbc 6.3; hgb 9.2 hct 30.1 mcv 91.8 plt 440; glucose 101; bun 12; creat 1.17; k + 3.5; na++ 134; ca 8.2; gfr 51 04-25-22: glucose 75; bun 12; creat 0.92; k+  2.6; na++ 142; ca 8.0; gfr >60;  protein 5.4; albumin 2.4. depakote 86; vitamin B 12: 421; folate 3.2; iron 33; tibc 127. 04-26-22: k+ 4.4  11-07-22: wbc 2.9; hgb 10.1; hct 31.4; mcv 101.9 plt 213;glucose 77; bun 25; creat 1.37; k+ 3.8; na++ 137; ca 8.5; gfr 42; protein 6.9 albumin 2.8; tsh 8.057; free t4: 0.75; keppra 37.7; iron 54; tibc 235; hepatitis C nr; depakote 71 keppra 37.7 chol 172 ldl 125; trig 62; hdl 35 11-14-22:  keppra 34.9; vitamin B 12 197   TODAY  02-13-23: tsh 0.123 chol 157; ldl 101; trig 91; hdl 38    Review of Systems  Constitutional:  Negative for malaise/fatigue.  Respiratory:  Negative for cough and shortness of breath.   Cardiovascular:  Negative for chest pain, palpitations and leg swelling.  Gastrointestinal:  Negative for abdominal pain, constipation and heartburn.  Musculoskeletal:  Negative for back pain, joint pain and myalgias.  Skin: Negative.   Neurological:  Negative for dizziness.  Psychiatric/Behavioral:  The patient is not nervous/anxious.    Physical Exam Constitutional:      General: She is not in acute distress.    Appearance: She is well-developed. She is not diaphoretic.  Neck:     Thyroid: No thyromegaly.  Cardiovascular:     Rate and Rhythm: Normal rate and regular rhythm.     Pulses: Normal pulses.     Heart sounds: Normal heart sounds.  Pulmonary:     Effort: Pulmonary effort is normal. No respiratory distress.     Breath sounds: Normal breath sounds.  Abdominal:     General: Bowel sounds are normal. There is no distension.     Palpations: Abdomen is soft.     Tenderness: There is no abdominal tenderness.  Musculoskeletal:     Cervical back: Neck supple.     Right lower leg: No edema.     Left lower leg: No edema.     Comments: Has neck contracture   Lymphadenopathy:     Cervical: No cervical adenopathy.  Skin:    General: Skin is warm and dry.  Neurological:     Mental Status: She is alert and oriented to person, place, and  time.  Psychiatric:        Mood and Affect: Mood normal.      ASSESSMENT/ PLAN:  TODAY  Acquired hypothyroidism Hyperlipidemia ldl goal <70  Will increase crestor to 10 mg daily Will change synthroid to 88 mcg daily Will check labs on 04-14-23.    Synthia Innocent NP [facility]   call [phone]

## 2023-02-14 DIAGNOSIS — R488 Other symbolic dysfunctions: Secondary | ICD-10-CM | POA: Diagnosis not present

## 2023-02-14 DIAGNOSIS — I69398 Other sequelae of cerebral infarction: Secondary | ICD-10-CM | POA: Diagnosis not present

## 2023-02-14 LAB — MISC LABCORP TEST (SEND OUT): Labcorp test code: 144050

## 2023-02-17 ENCOUNTER — Other Ambulatory Visit (HOSPITAL_COMMUNITY)
Admission: RE | Admit: 2023-02-17 | Discharge: 2023-02-17 | Disposition: A | Discharge status: Home / Self Care | Payer: Medicare Other | Source: Skilled Nursing Facility | Attending: Internal Medicine | Admitting: Internal Medicine

## 2023-02-17 DIAGNOSIS — E785 Hyperlipidemia, unspecified: Secondary | ICD-10-CM | POA: Insufficient documentation

## 2023-02-17 LAB — COMPREHENSIVE METABOLIC PANEL
ALT: 7 U/L (ref 0–44)
AST: 13 U/L — ABNORMAL LOW (ref 15–41)
Albumin: 3.1 g/dL — ABNORMAL LOW (ref 3.5–5.0)
Alkaline Phosphatase: 60 U/L (ref 38–126)
Anion gap: 7 (ref 5–15)
BUN: 39 mg/dL — ABNORMAL HIGH (ref 8–23)
CO2: 26 mmol/L (ref 22–32)
Calcium: 8.8 mg/dL — ABNORMAL LOW (ref 8.9–10.3)
Chloride: 105 mmol/L (ref 98–111)
Creatinine, Ser: 1.52 mg/dL — ABNORMAL HIGH (ref 0.44–1.00)
GFR, Estimated: 37 mL/min — ABNORMAL LOW (ref >60)
Glucose, Bld: 72 mg/dL (ref 70–99)
Potassium: 4 mmol/L (ref 3.5–5.1)
Sodium: 138 mmol/L (ref 135–145)
Total Bilirubin: 0.5 mg/dL (ref 0.3–1.2)
Total Protein: 7.7 g/dL (ref 6.5–8.1)

## 2023-02-17 LAB — LIPID PANEL
Cholesterol: 132 mg/dL (ref 0–200)
HDL: 34 mg/dL — ABNORMAL LOW (ref >40)
LDL Cholesterol: 83 mg/dL (ref 0–99)
Total CHOL/HDL Ratio: 3.9 RATIO
Triglycerides: 75 mg/dL (ref <150)
VLDL: 15 mg/dL (ref 0–40)

## 2023-02-17 LAB — TSH: TSH: 0.226 u[IU]/mL — ABNORMAL LOW (ref 0.350–4.500)

## 2023-02-17 LAB — CK: Total CK: 37 U/L — ABNORMAL LOW (ref 38–234)

## 2023-02-21 ENCOUNTER — Ambulatory Visit (HOSPITAL_COMMUNITY): Payer: Medicare Other

## 2023-02-25 ENCOUNTER — Non-Acute Institutional Stay (SKILLED_NURSING_FACILITY): Payer: Medicare Other | Admitting: Internal Medicine

## 2023-02-25 ENCOUNTER — Encounter: Payer: Self-pay | Admitting: Internal Medicine

## 2023-02-25 DIAGNOSIS — R627 Adult failure to thrive: Secondary | ICD-10-CM | POA: Diagnosis not present

## 2023-02-25 DIAGNOSIS — M0579 Rheumatoid arthritis with rheumatoid factor of multiple sites without organ or systems involvement: Secondary | ICD-10-CM

## 2023-02-25 DIAGNOSIS — N184 Chronic kidney disease, stage 4 (severe): Secondary | ICD-10-CM | POA: Diagnosis not present

## 2023-02-25 DIAGNOSIS — E039 Hypothyroidism, unspecified: Secondary | ICD-10-CM | POA: Diagnosis not present

## 2023-02-25 NOTE — Progress Notes (Unsigned)
NURSING HOME LOCATION:  [facility] ROOM NUMBER:  [id number]  CODE STATUS:  DNR  PCP:  Synthia Innocent NP  This is a nursing facility follow up visit of chronic medical diagnoses & to document compliance with Regulation 483.30 (c) in The Long Term Care Survey Manual Phase 2 which mandates caregiver visit ( visits can alternate among physician, PA or NP as per statutes) within 10 days of 30 days / 60 days/ 90 days post admission to SNF date    Interim medical record and care since last SNF visit was updated with review of diagnostic studies and change in clinical status since last visit were documented.  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 On 02/17/2023 she had extensive lab studies performed.  Current creatinine is 1.52 with a GFR of 37; 11 months ago values were 0.84/greater than 60.  This indicates AKI stage IIIb.  There has been significant improvement in albumin from prior values of 2.1 up to present values of 3.1.  Total protein is increased from a nadir of 5.4 up to 7.7.  Anemia has improved significantly with current H/H of 10.1/31.4.  Nadir values were 8.2/26.3.  She does have MCV of 101.9.  Most recent B12 level was low normal at 197 in February of this year.  Med list states that she is on 1000 mcg orally daily.  Also in February hypothyroidism was present with a TSH of 8.057.  There has been improvement but now the TSH is 0.226 suggesting oversupplementation.  Epic does not verify her present L-thyroxine dosage.  LDL is 83 on 10 mg of rosuvastatin.  Prior value was 125.  Review of systems: She describes a minor tremor of the right hand intermittently.  She states that her appetite is improved and intake is especially good at breakfast lunch.   She validates weight gain.  She describes intermittent bilateral shoulder pain which responds to Tylenol.  She also has intermittent pain at the wrists.  She describes some anxiety due to her roommate's condition.  She states that her roommate is up in the middle of the night with the lights on rummaging through drawers and talking to herself.  She is not requesting a room change as she plans on leaving the facility in the near future.  Constitutional: No fever, fatigue  Eyes: No redness, discharge, pain, vision change ENT/mouth: No nasal congestion,  purulent discharge, earache, change in hearing, sore throat  Cardiovascular: No chest pain, palpitations, paroxysmal nocturnal dyspnea, claudication, edema  Respiratory: No cough, sputum production, hemoptysis, DOE, significant snoring, apnea   Gastrointestinal: No heartburn, dysphagia, abdominal pain, nausea /vomiting, rectal bleeding, melena, change in bowels Genitourinary: No dysuria, hematuria, pyuria, incontinence, nocturia Musculoskeletal: No joint swelling, weakness Dermatologic: No rash, pruritus, change in appearance of skin Neurologic: No dizziness, headache, syncope, seizures, numbness, tingling Psychiatric: No significant depression, insomnia, anorexia Endocrine: No change in hair/skin/nails, excessive thirst, excessive hunger, excessive urination  Hematologic/lymphatic: No significant bruising, lymphadenopathy, abnormal bleeding Allergy/immunology: No itchy/watery eyes, significant sneezing, urticaria, angioedema  Physical exam:  Pertinent or positive findings: She is thin but adequately nourished.  Facies are blank and affect somewhat flat.  Dental hygiene is extremely good.  S1 and S2 accentuated.  Abdomen is protuberant.  I cannot palpate the aorta.  The right dorsalis pedis pulse is stronger than the other pulses.  She has trace edema at the sock line.  Interosseous wasting is present.  No tremor is noted with extended  hands.  General appearance: no acute distress, increased work of breathing is present.   Lymphatic: No lymphadenopathy about the head, neck, axilla. Eyes: No conjunctival inflammation or lid edema is present. There is no scleral icterus. Ears:  External ear exam shows no significant lesions or deformities.   Nose:  External nasal examination shows no deformity or inflammation. Nasal mucosa are pink and moist without lesions, exudates Oral exam:  Lips and gums are healthy appearing. There is no oropharyngeal erythema or exudate. Neck:  No thyromegaly, masses, tenderness noted.    Heart:  Normal rate and regular rhythm without gallop, murmur, click, rub .  Lungs: Chest clear to auscultation without wheezes, rhonchi, rales, rubs. Abdomen: Bowel sounds are normal. Abdomen is soft and nontender with no organomegaly, hernias, masses. GU: Deferred  Extremities:  No cyanosis, clubbing Neurologic exam :Deep tendon reflexes are equal Skin: Warm & dry w/o tenting. No significant lesions or rash.  See summary under each active problem in the Problem List with associated updated therapeutic plan

## 2023-02-25 NOTE — Assessment & Plan Note (Signed)
L-thyroxine dose has been titrated; present TSH is 0.226.  Follow-up TSH after 6-8 weeks on current L-thyroxine dose which was verified with NP prescriber.

## 2023-02-25 NOTE — Assessment & Plan Note (Signed)
She validates improvement in her appetite and reports good intake at breakfast and lunch.  She also validates documented weight gain.  Albumin has improved from a nadir of 2.1 to a value of 3.1.  Total protein has normalized at 7.7 from a prior value of 5.4.  Interosseous wasting of the hands is still present.  Nutritionist continues to follow at [facility].

## 2023-02-25 NOTE — Patient Instructions (Signed)
See assessment and plan under each diagnosis in the problem list and acutely for this visit 

## 2023-02-25 NOTE — Assessment & Plan Note (Signed)
She complains of intermittent shoulder pain responsive to Tylenol as well as rest pain.  Sed rate will be checked with follow-up labs.

## 2023-02-25 NOTE — Assessment & Plan Note (Signed)
As of 02/17/2023 creatinine is 1.5 to and GFR 37 indicating CKD stage IIIb.  11 months ago values were 0.84 and GFR greater than 60 indicating CKD 2. UptoDate reviewed; no dosage adjustment in Keppra or Depakote required.  Continue to monitor renal function.

## 2023-02-28 ENCOUNTER — Ambulatory Visit (HOSPITAL_COMMUNITY): Payer: Medicare Other

## 2023-03-03 ENCOUNTER — Non-Acute Institutional Stay (INDEPENDENT_AMBULATORY_CARE_PROVIDER_SITE_OTHER): Payer: Medicare Other | Admitting: Adult Health

## 2023-03-03 ENCOUNTER — Encounter: Payer: Self-pay | Admitting: Adult Health

## 2023-03-03 DIAGNOSIS — Z0000 Encounter for general adult medical examination without abnormal findings: Secondary | ICD-10-CM | POA: Diagnosis not present

## 2023-03-03 NOTE — Patient Instructions (Signed)
   [name] , Thank you for taking time to come for your Medicare Wellness Visit. I appreciate your ongoing commitment to your health goals. Please review the following plan we discussed and let me know if I can assist you in the future.   These are the goals we discussed:  Goals      Absence of Fall and Fall-Related Injury     Evidence-based guidance:  Assess fall risk using a validated tool when available. Consider balance and gait impairment, muscle weakness, diminished vision or hearing, environmental hazards, presence of urinary or bowel urgency and/or incontinence.  Communicate fall injury risk to interprofessional healthcare team.  Develop a fall prevention plan with the patient and family.  Promote use of personal vision and auditory aids.  Promote reorientation, appropriate sensory stimulation, and routines to decrease risk of fall when changes in mental status are present.  Assess assistance level required for safe and effective self-care; consider referral for home care.  Encourage physical activity, such as performance of self-care at highest level of ability, strength and balance exercise program, and provision of appropriate assistive devices; refer to rehabilitation therapy.  Refer to community-based fall prevention program where available.  If fall occurs, determine the cause and revise fall injury prevention plan.  Regularly review medication contribution to fall risk; consider risk related to polypharmacy and age.  Refer to pharmacist for consultation when concerns about medications are revealed.  Balance adequate pain management with potential for oversedation.  Provide guidance related to environmental modifications.  Consider supplementation with Vitamin D.   Notes:      DIET - INCREASE WATER INTAKE     General - Client will not be readmitted within 30 days (C-SNP)     Have 3 meals a day        This is a list of the screening recommended for you and due dates:   Health Maintenance  Topic Date Due   Mammogram  06/23/2023*   DEXA scan (bone density measurement)  07/21/2023*   Flu Shot  04/17/2023   Medicare Annual Wellness Visit  03/02/2024   Colon Cancer Screening  02/22/2032   DTaP/Tdap/Td vaccine (3 - Td or Tdap) 03/05/2032   Pneumonia Vaccine  Completed   Hepatitis C Screening  Completed   Zoster (Shingles) Vaccine  Completed   HPV Vaccine  Aged Out   COVID-19 Vaccine  Discontinued  *Topic was postponed. The date shown is not the original due date.

## 2023-03-03 NOTE — Progress Notes (Signed)
Subjective:   Rebekah [name] is a 67 y.o. female who presents for Medicare Annual (Subsequent) preventive examination.  Review of Systems    Review of Systems  Constitutional:  Negative for malaise/fatigue.  Respiratory:  Negative for cough and shortness of breath.   Cardiovascular:  Negative for chest pain, palpitations and leg swelling.  Gastrointestinal:  Negative for abdominal pain, constipation and heartburn.  Musculoskeletal:  Negative for back pain, joint pain and myalgias.  Skin: Negative.   Neurological:  Negative for dizziness.  Psychiatric/Behavioral:  The patient is not nervous/anxious.     Cardiac Risk Factors include: advanced age (>60men, >51 women);sedentary lifestyle;dyslipidemia     Objective:    Today's Vitals   03/03/23 1142  BP: (!) 131/58  Pulse: 75  Resp: 20  Temp: 98.3 F (36.8 C)  SpO2: 98%  Weight: 140 lb 9.6 oz (63.8 kg)  Height: 5\' 6"  (1.676 m)   Body mass index is 22.69 kg/m.     02/13/2023   11:15 AM 02/12/2023    9:20 AM 02/07/2023    4:47 PM 01/14/2023   12:13 PM 11/08/2022   11:24 AM 11/07/2022   11:54 AM 10/03/2022    2:03 PM  Advanced Directives  Does Patient Have a Medical Advance Directive? Yes Yes Yes Yes Yes Yes Yes  Type of Advance Directive Out of facility DNR (pink MOST or yellow form) Out of facility DNR (pink MOST or yellow form) Out of facility DNR (pink MOST or yellow form) Out of facility DNR (pink MOST or yellow form) Out of facility DNR (pink MOST or yellow form) Out of facility DNR (pink MOST or yellow form) Out of facility DNR (pink MOST or yellow form)  Does patient want to make changes to medical advance directive? No - Patient declined No - Patient declined  No - Patient declined No - Patient declined No - Patient declined No - Patient declined    Current Medications (verified) Outpatient Encounter Medications as of 03/03/2023  Medication Sig   acetaminophen (TYLENOL) 325 MG tablet Take 650 mg by mouth every 6  (six) hours as needed.   Amino Acids-Protein Hydrolys (PRO-STAT AWC) LIQD Take by mouth. 17-100 gram-kcal/30 mL; oral Three Times A Day [DX: Unspecified severe protein-calorie malnutrition]   cyanocobalamin (VITAMIN B12) 1000 MCG tablet Take 1,000 mcg by mouth daily.   divalproex (DEPAKOTE) 500 MG DR tablet Take 1 tablet (500 mg total) by mouth 2 (two) times daily.   Ensure Plus (ENSURE PLUS) LIQD Take 237 mLs by mouth daily.   levETIRAcetam (KEPPRA) 100 MG/ML solution Take 750 mg by mouth 2 (two) times daily.   LORazepam (ATIVAN) 2 MG/ML injection Inject 0.5 mLs (1 mg total) into the muscle every 15 (fifteen) minutes as needed. Give 1 mg IM very 15 minutes as needed for up to 3 doses; if not relieved after 3rd dose send to the ED   melatonin 5 MG TABS Take 5 mg by mouth at bedtime.   ondansetron (ZOFRAN-ODT) 8 MG disintegrating tablet Take 8 mg by mouth every 6 (six) hours as needed for nausea or vomiting. Before meals and at bedtime   rosuvastatin (CRESTOR) 10 MG tablet Take 10 mg by mouth daily.   sennosides-docusate sodium (SENOKOT-S) 8.6-50 MG tablet Take 1 tablet by mouth 2 (two) times daily.   UNABLE TO FIND Diet: Regular diet. Send small portions.   No facility-administered encounter medications on file as of 03/03/2023.    Allergies (verified) Patient has no known allergies.  History: Past Medical History:  Diagnosis Date   Arthritis    Rheumatoid   Dysphagia    post CVA   Hypertension    Lupus (systemic lupus erythematosus) (HCC)    Seizure (HCC)    Stroke [facility])    Past Surgical History:  Procedure Laterality Date   BIOPSY  02/21/2022   Procedure: BIOPSY;  Surgeon: Lanelle Bal, DO;  [facility]: [facility];  Service: Endoscopy;;   CESAREAN SECTION     COLONOSCOPY WITH PROPOFOL N/A 02/21/2022   Procedure: COLONOSCOPY WITH PROPOFOL;  Surgeon: Lanelle Bal, DO;  [facility]: [facility];  Service: Endoscopy;  Laterality: N/A;  2:00pm   ESOPHAGEAL BRUSHING  02/21/2022    Procedure: ESOPHAGEAL BRUSHING;  Surgeon: Lanelle Bal, DO;  [facility]: [facility];  Service: Endoscopy;;   ESOPHAGOGASTRODUODENOSCOPY (EGD) WITH PROPOFOL N/A 02/21/2022   Procedure: ESOPHAGOGASTRODUODENOSCOPY (EGD) WITH PROPOFOL;  Surgeon: Lanelle Bal, DO;  [facility];  Service: Endoscopy;  Laterality: N/A;   POLYPECTOMY  02/21/2022   Procedure: POLYPECTOMY;  Surgeon: Lanelle Bal, DO;  [facility]: [facility];  Service: Endoscopy;;   Family History  Problem Relation Age of Onset   Diabetes Mother    Hypertension Mother    Cancer Mother    Stroke Mother    Colon cancer Father    Hypertension Sister    Hypertension Sister    Hypertension Sister    Hypertension Sister    Hypertension Sister    Hypertension Sister    Hypertension Brother    Hypertension Brother    Social History   Socioeconomic History   Marital status: Single    Spouse name: Not on file   Number of children: Not on file   Years of education: Not on file   Highest education level: Not on file  Occupational History   Not on file  Tobacco Use   Smoking status: Never   Smokeless tobacco: Never  Vaping Use   Vaping Use: Never used  Substance and Sexual Activity   Alcohol use: No    Alcohol/week: 0.0 standard drinks of alcohol   Drug use: No   Sexual activity: Not on file  Other Topics Concern   Not on file  Social History Narrative   Lives with sister.     Social Determinants of Health   Financial Resource Strain: Not on file  Food Insecurity: Not on file  Transportation Needs: Not on file  Physical Activity: Not on file  Stress: Not on file  Social Connections: Not on file    Tobacco Counseling Counseling given: Not Answered   Clinical Intake:  Pre-visit preparation completed: Yes  Pain : No/denies pain     BMI - recorded: 22.69 Nutritional Status: BMI of 19-24  Normal Nutritional Risks: Unintentional weight loss, Failure to thrive Diabetes: No  How  often do you need to have someone help you when you read instructions, pamphlets, or other written materials from your doctor or pharmacy?: 5 - Always  Diabetic?no  Interpreter Needed?: No      Activities of Daily Living    03/03/2023   11:47 AM  In your present state of health, do you have any difficulty performing the following activities:  Hearing? 0  Vision? 0  Difficulty concentrating or making decisions? 0  Walking or climbing stairs? 1  Dressing or bathing? 1  Doing errands, shopping? 1  Preparing Food and eating ? Y  Using the Toilet? Y  In the  past six months, have you accidently leaked urine? Y  Do you have problems with loss of bowel control? Y  Managing your Medications? Y  Managing your Finances? Y  Housekeeping or managing your Housekeeping? Y    Patient Care Team: Sharee Holster, NP as PCP - General (Geriatric Medicine) Rollene Rotunda, MD as PCP - Cardiology ([facility]  Indicate any recent Medical Services you may have received from other than [facility] providers in the past year (date may be approximate).     Assessment:   This is a routine wellness examination for Tylena.  Hearing/Vision screen No results found.  Dietary issues and exercise activities discussed: Current Exercise Habits: The patient does not participate in regular exercise at present, Exercise limited by: None identified   Goals Addressed             This Visit's Progress    Absence of Fall and Fall-Related Injury   On track    Evidence-based guidance:  Assess fall risk using a validated tool when available. Consider balance and gait impairment, muscle weakness, diminished vision or hearing, environmental hazards, presence of urinary or bowel urgency and/or incontinence.  Communicate fall injury risk to interprofessional healthcare team.  Develop a fall prevention plan with the patient and family.  Promote use of personal vision  and auditory aids.  Promote reorientation, appropriate sensory stimulation, and routines to decrease risk of fall when changes in mental status are present.  Assess assistance level required for safe and effective self-care; consider referral for home care.  Encourage physical activity, such as performance of self-care at highest level of ability, strength and balance exercise program, and provision of appropriate assistive devices; refer to rehabilitation therapy.  Refer to community-based fall prevention program where available.  If fall occurs, determine the cause and revise fall injury prevention plan.  Regularly review medication contribution to fall risk; consider risk related to polypharmacy and age.  Refer to pharmacist for consultation when concerns about medications are revealed.  Balance adequate pain management with potential for oversedation.  Provide guidance related to environmental modifications.  Consider supplementation with Vitamin D.   Notes:      DIET - INCREASE WATER INTAKE   On track    General - Client will not be readmitted within 30 days (C-SNP)   On track    Have 3 meals a day   On track      Depression Screen    03/03/2023   11:45 AM 01/16/2023   11:41 AM 11/13/2022   12:04 PM 02/27/2022    2:51 PM 02/18/2022    1:53 PM 11/13/2021    3:35 PM 09/26/2021   10:10 AM  PHQ 2/9 Scores  PHQ - 2 Score 0 0 0 0 0 0 3  PHQ- 9 Score 0 0 0   0 11    Fall Risk    03/03/2023   11:47 AM 01/16/2023   11:41 AM 11/08/2022   11:25 AM 02/27/2022    2:49 PM 02/18/2022    1:53 PM  Fall Risk   Falls in the past year? 0 0 0 0 0  Number falls in past yr: 0 0 0 0 0  Injury with Fall? 0 0 0 0 0  Risk for fall due to : Impaired balance/gait;Impaired mobility Impaired balance/gait No Fall Risks Impaired balance/gait;Impaired mobility Impaired balance/gait;Impaired mobility  Follow up  Falls evaluation completed Falls evaluation completed      FALL RISK PREVENTION  PERTAINING TO THE  HOME:  Any stairs in or around the home? Yes  If so, are there any without handrails? No  Home free of loose throw rugs in walkways, pet beds, electrical cords, etc? Yes  Adequate lighting in your home to reduce risk of falls? Yes   ASSISTIVE DEVICES UTILIZED TO PREVENT FALLS:  Life alert? No  Use of a cane, walker or w/c? Yes  Grab bars in the bathroom? Yes  Shower chair or bench in shower? Yes  Elevated toilet seat or a handicapped toilet? Yes   TIMED UP AND GO:  Was the test performed? No .  Nonambulatory  Cognitive Function:    03/03/2023   11:48 AM 02/27/2022    2:52 PM  MMSE - Mini Mental State Exam  Orientation to time 4 5  Orientation to Place 3 5  Registration 3 3  Attention/ Calculation 3 5  Recall 2 3  Language- name 2 objects 2 2  Language- repeat 1 1  Language- follow 3 step command 2 3  Language- read & follow direction 1 1  Write a sentence 1 0  Copy design 0 0  Total score 22 28        03/03/2023   11:49 AM 02/27/2022    2:53 PM  6CIT Screen  What Year? 0 points 0 points  What month? 0 points 0 points  What time? 0 points 0 points  Count back from 20 2 points 2 points  Months in reverse 2 points 2 points  Repeat phrase 4 points 4 points  Total Score 8 points 8 points    Immunizations Immunization History  Administered Date(s) Administered   Influenza, High Dose Seasonal PF 06/18/2022   PFIZER(Purple Top)SARS-COV-2 Vaccination 05/10/2020, 05/31/2020   PNEUMOCOCCAL CONJUGATE-20 02/28/2022   Pfizer Covid-19 Vaccine Bivalent Booster 68yrs & up 02/05/2022, 07/10/2022   Rsv, Bivalent, Protein Subunit Rsvpref,pf Verdis Frederickson) 08/19/2022   Tdap 08/16/2021, 03/05/2022   Zoster Recombinat (Shingrix) 01/11/2022, 04/10/2022    TDAP status: Up to date  Flu Vaccine status: Up to date  Pneumococcal vaccine status: Up to date  Covid-19 vaccine status: Completed vaccines  Qualifies for Shingles Vaccine? Yes   Zostavax completed No   Shingrix  Completed?: Yes  Screening Tests Health Maintenance  Topic Date Due   MAMMOGRAM  06/23/2023 (Originally 09/14/2005)   DEXA SCAN  07/21/2023 (Originally 09/14/2020)   INFLUENZA VACCINE  04/17/2023   Medicare Annual Wellness (AWV)  03/02/2024   Colonoscopy  02/22/2032   DTaP/Tdap/Td (3 - Td or Tdap) 03/05/2032   Pneumonia Vaccine 64+ Years old  Completed   Hepatitis C Screening  Completed   Zoster Vaccines- Shingrix  Completed   HPV VACCINES  Aged Out   COVID-19 Vaccine  Discontinued    Health Maintenance  There are no preventive care reminders to display for this patient.   Colorectal cancer screening: Type of screening: Cologuard. Completed  . Repeat every   years  Mammogram status: Ordered  . Pt provided with contact info and advised to call to schedule appt.   Bone Density status: Ordered  . Pt provided with contact info and advised to call to schedule appt.  Lung Cancer Screening: (Low Dose CT Chest recommended if Age 75-80 years, 30 pack-year currently smoking OR have quit w/in 15years.) does not qualify.   Lung Cancer Screening Referral:   Additional Screening:  Hepatitis C Screening: does not qualify; Completed   Vision Screening: Recommended annual ophthalmology exams for early detection of glaucoma  and other disorders of the eye. Is the patient up to date with their annual eye exam?  No  Who is the provider or what is the name of the office in which the patient attends annual eye exams?  If pt is not established with a provider, would they like to be referred to a provider to establish care? No .   Dental Screening: Recommended annual dental exams for proper oral hygiene  Community Resource Referral / Chronic Care Management: CRR required this visit?  No   CCM required this visit?  No      Plan:     I have personally reviewed and noted the following in the patient's chart:   Medical and social history Use of alcohol, tobacco or illicit drugs  Current  medications and supplements including opioid prescriptions. Patient is currently taking opioid prescriptions. Information provided to patient regarding non-opioid alternatives. Patient advised to discuss non-opioid treatment plan with their provider. Functional ability and status Nutritional status Physical activity Advanced directives List of other physicians Hospitalizations, surgeries, and ER visits in previous 12 months Vitals Screenings to include cognitive, depression, and falls Referrals and appointments  In addition, I have reviewed and discussed with patient certain preventive protocols, quality metrics, and best practice recommendations. A written personalized care plan for preventive services as well as general preventive health recommendations were provided to patient.     Sharee Holster, NP   03/03/2023   Nurse Notes: this exam was performed by myself at this facility

## 2023-03-11 ENCOUNTER — Ambulatory Visit: Payer: Medicare Other | Admitting: Obstetrics and Gynecology

## 2023-03-12 ENCOUNTER — Ambulatory Visit: Payer: Medicare Other | Admitting: Obstetrics and Gynecology

## 2023-04-09 ENCOUNTER — Encounter: Payer: Self-pay | Admitting: Adult Health

## 2023-04-09 ENCOUNTER — Non-Acute Institutional Stay (SKILLED_NURSING_FACILITY): Payer: Medicare Other | Admitting: Adult Health

## 2023-04-09 DIAGNOSIS — E039 Hypothyroidism, unspecified: Secondary | ICD-10-CM

## 2023-04-09 DIAGNOSIS — D649 Anemia, unspecified: Secondary | ICD-10-CM | POA: Diagnosis not present

## 2023-04-09 DIAGNOSIS — D75839 Thrombocytosis, unspecified: Secondary | ICD-10-CM | POA: Diagnosis not present

## 2023-04-09 DIAGNOSIS — F339 Major depressive disorder, recurrent, unspecified: Secondary | ICD-10-CM | POA: Diagnosis not present

## 2023-04-09 NOTE — Progress Notes (Unsigned)
Location:  [facility] Room Number: [address] Place of Service:  SNF (31)   CODE STATUS: DNR  No Known Allergies  Chief Complaint  Patient presents with   Medical Management of Chronic Issues          Major depression recurrent chronic:  Chronic anemia:    Thrombocytosis    Hypothyroidism     HPI:  She is a 67 year old long term resident of this facility being seen for the management of her chronic illnesses:  Major depression recurrent chronic:  Chronic anemia:    Thrombocytosis    Hypothyroidism. She does get out of bed daily; her weight is stable; there no reports of pain present.   Past Medical History:  Diagnosis Date   Arthritis    Rheumatoid   Dysphagia    post CVA   Hypertension    Lupus (systemic lupus erythematosus) (HCC)    Seizure (HCC)    Stroke [facility])     Past Surgical History:  Procedure Laterality Date   BIOPSY  02/21/2022   Procedure: BIOPSY;  Surgeon: Lanelle Bal, DO;  [facility];  Service: Endoscopy;;   CESAREAN SECTION     COLONOSCOPY WITH PROPOFOL N/A 02/21/2022   Procedure: COLONOSCOPY WITH PROPOFOL;  Surgeon: Lanelle Bal, DO;  [facility];  Service: Endoscopy;  Laterality: N/A;  2:00pm   ESOPHAGEAL BRUSHING  02/21/2022   Procedure: ESOPHAGEAL BRUSHING;  Surgeon: Lanelle Bal, DO;  [facility];  Service: Endoscopy;;   ESOPHAGOGASTRODUODENOSCOPY (EGD) WITH PROPOFOL N/A 02/21/2022   Procedure: ESOPHAGOGASTRODUODENOSCOPY (EGD) WITH PROPOFOL;  Surgeon: Lanelle Bal, DO;  [facility];  Service: Endoscopy;  Laterality: N/A;   POLYPECTOMY  02/21/2022   Procedure: POLYPECTOMY;  Surgeon: Lanelle Bal, DO;  [facility];  Service: Endoscopy;;    Social History   Socioeconomic History   Marital status: Single    Spouse name: Not on file   Number of children: Not on file   Years of education: Not on file   Highest education level: Not on file  Occupational  History   Not on file  Tobacco Use   Smoking status: Never   Smokeless tobacco: Never  Vaping Use   Vaping status: Never Used  Substance and Sexual Activity   Alcohol use: No    Alcohol/week: 0.0 standard drinks of alcohol   Drug use: No   Sexual activity: Not on file  Other Topics Concern   Not on file  Social History Narrative   Lives with sister.     Social Determinants of Health   Financial Resource Strain: Low Risk  (12/03/2021)   Received from [facility]   Overall Financial Resource Strain (CARDIA)    Difficulty of Paying Living Expenses: Not hard at all  Food Insecurity: No Food Insecurity (05/01/2021)   Received from [facility]   Hunger Vital Sign  Transportation Needs: No Transportation Needs (12/04/2021)   Received from [facility] - Transportation    Lack of Transportation (Medical): No    Lack of Transportation (Non-Medical): No  Physical Activity: Not on file  Stress: No Stress Concern Present (12/03/2021)   Received from [facility] of Occupational [facility] - Occupational Stress Questionnaire    Feeling of Stress : Not at all  Social Connections: Unknown (01/17/2022)   Received from [facility]   Social Network    Social  Network: Not on file  Intimate Partner Violence: Unknown (12/17/2021)   Received from [facility]   HITS    Physically Hurt: Not on file    Insult or Talk Down To: Not on file    Threaten Physical Harm: Not on file    Scream or Curse: Not on file   Family History  Problem Relation Age of Onset   Diabetes Mother    Hypertension Mother    Cancer Mother    Stroke Mother    Colon cancer Father    Hypertension Sister    Hypertension Sister    Hypertension Sister    Hypertension Sister    Hypertension Sister    Hypertension Sister    Hypertension Brother    Hypertension Brother       VITAL SIGNS BP 131/62   Pulse 74   Temp 98.2 F (36.8 C)   Resp 20   Ht 5\' 6"  (1.676 m)   Wt 142 lb  3.2 oz (64.5 kg)   SpO2 98%   BMI 22.95 kg/m   Outpatient Encounter Medications as of 04/09/2023  Medication Sig   acetaminophen (TYLENOL) 325 MG tablet Take 650 mg by mouth every 6 (six) hours as needed.   cyanocobalamin (VITAMIN B12) 1000 MCG tablet Take 1,000 mcg by mouth daily.   Ensure Plus (ENSURE PLUS) LIQD Take 237 mLs by mouth daily.   levETIRAcetam (KEPPRA) 100 MG/ML solution Take 750 mg by mouth 2 (two) times daily.   levothyroxine (SYNTHROID) 88 MCG tablet Take 88 mcg by mouth once.   melatonin 5 MG TABS Take 5 mg by mouth at bedtime.   ondansetron (ZOFRAN-ODT) 8 MG disintegrating tablet Take 8 mg by mouth every 6 (six) hours as needed for nausea or vomiting. Before meals and at bedtime   rosuvastatin (CRESTOR) 10 MG tablet Take 10 mg by mouth daily.   sennosides-docusate sodium (SENOKOT-S) 8.6-50 MG tablet Take 1 tablet by mouth 2 (two) times daily.   UNABLE TO FIND Diet: Regular diet. Send small portions.   divalproex (DEPAKOTE) 500 MG DR tablet Take 1 tablet (500 mg total) by mouth 2 (two) times daily.   [DISCONTINUED] Amino Acids-Protein Hydrolys (PRO-STAT AWC) LIQD Take by mouth. 17-100 gram-kcal/30 mL; oral Three Times A Day [DX: Unspecified severe protein-calorie malnutrition]   [DISCONTINUED] LORazepam (ATIVAN) 2 MG/ML injection Inject 0.5 mLs (1 mg total) into the muscle every 15 (fifteen) minutes as needed. Give 1 mg IM very 15 minutes as needed for up to 3 doses; if not relieved after 3rd dose send to the ED   No facility-administered encounter medications on file as of 04/09/2023.     SIGNIFICANT DIAGNOSTIC EXAMS  PREVIOUS   04-09-22: right humerus x-ray:No acute fracture or dislocation of the humerus or forearm.   04-09-22: pelvic x-ray  No evidence of acute hip fracture. Moderate bilateral hip osteoarthritis.  04-09-22: ct of head:  1.  No acute intracranial process. 2.  Left parietal vertex scalp hematoma.  04-09-22: ct of cervical spine:  No evidence of  acute cervical spine fracture or traumatic listhesis.   NO NEW EXAMS    LABS REVIEWED:    03-26-22: wbc 4.2; hgb 8.2; hct 26.3; mcv 86.8 plt 508; glucose 78; bun 9; creat 0.84; k+ 2.4; na++ 136; ca 7.6; gfr>60; protein 6.5; albumin 2.1; tsh 4.826; sed rate: 82 ANA+ 03-28-22: k+ 4.0 03-31-22: RPR nr; HIV nr 04-09-22: wbc 6.3; hgb 9.2 hct 30.1 mcv 91.8 plt 440; glucose 101; bun 12; creat  1.17; k + 3.5; na++ 134; ca 8.2; gfr 51 04-25-22: glucose 75; bun 12; creat 0.92; k+ 2.6; na++ 142; ca 8.0; gfr >60;  protein 5.4; albumin 2.4. depakote 86; vitamin B 12: 421; folate 3.2; iron 33; tibc 127. 04-26-22: k+ 4.4  11-07-22: wbc 2.9; hgb 10.1; hct 31.4; mcv 101.9 plt 213;glucose 77; bun 25; creat 1.37; k+ 3.8; na++ 137; ca 8.5; gfr 42; protein 6.9 albumin 2.8; tsh 8.057; free t4: 0.75; keppra 37.7; iron 54; tibc 235; hepatitis C nr; depakote 71 keppra 37.7 chol 172 ldl 125; trig 62; hdl 35 11-14-22: keppra 34.9; vitamin B 12 197   TODAY  02-13-23: tsh 0.123 chol 157; ldl 101; trig 91; hdl 38 02-17-23: glucose 72; bun 39; creat 1.52; k+ 4.0; na++ 138; ca 8.8; gfr 37; protein 7.7 albumin 3.1  tsh 0.226 chol 132; ldl 83 trig 75 hdl 34     Review of Systems  Constitutional:  Negative for malaise/fatigue.  Respiratory:  Negative for cough and shortness of breath.   Cardiovascular:  Negative for chest pain, palpitations and leg swelling.  Gastrointestinal:  Negative for abdominal pain, constipation and heartburn.  Musculoskeletal:  Negative for back pain, joint pain and myalgias.  Skin: Negative.   Neurological:  Negative for dizziness.  Psychiatric/Behavioral:  The patient is not nervous/anxious.     Physical Exam Constitutional:      General: She is not in acute distress.    Appearance: She is well-developed. She is not diaphoretic.  Neck:     Thyroid: No thyromegaly.  Cardiovascular:     Rate and Rhythm: Normal rate and regular rhythm.     Pulses: Normal pulses.     Heart sounds: Normal heart  sounds.  Pulmonary:     Effort: Pulmonary effort is normal. No respiratory distress.     Breath sounds: Normal breath sounds.  Abdominal:     General: Bowel sounds are normal. There is no distension.     Palpations: Abdomen is soft.     Tenderness: There is no abdominal tenderness.  Musculoskeletal:        General: Normal range of motion.     Cervical back: Neck supple.     Right lower leg: No edema.     Left lower leg: No edema.  Lymphadenopathy:     Cervical: No cervical adenopathy.  Skin:    General: Skin is warm and dry.  Neurological:     Mental Status: She is alert and oriented to person, place, and time.  Psychiatric:        Mood and Affect: Mood normal.     ASSESSMENT/ PLAN:  TODAY  Major depression recurrent chronic: is off zoloft 142 pounds  2. Chronic anemia: hgb 10.1  3. Thrombocytosis plt 213  4. Hypothyroidism: tsh 0.226 will continue synthroid 88 mcg daily will repeat tsh.    PREVIOUS   5. Vitamin B 12 deficiency: level 197 will continue 1,000 mcg daily   6. Hyperlipidemia LDL goal < 70: ldl 125 will continue crestor 5 mg daily   7. History of CVA: is off asa and brilanta  8. Neurocognitive deficits: will monitor   9. Chronic kidney disease (CKD) stage 4 (severe) bun 39; creat 1.52; gfr 37  10. Rheumatoid arthritis involving multiple sites with positive rheumatoid factor: is off steroids  11. 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 Will continue keppra 750 mg twice daily and depakote 500 mg twice daily   12. Protein  calorie malnutrition severe: protein 7.7 albumin 3.1 will continue prostat with meals and supplements as directed   13. Benign essential hypertension: b/p 131/62 is off norvasc and atenolol  14. Systemic lupus erythematous; is off steroids  15. GERD without esophagitis: is off protonix  16. Chronic constipation: will continue senna s twice daily    Synthia Innocent NP [facility] Adult  Medicine  call [phone]

## 2023-04-14 ENCOUNTER — Other Ambulatory Visit (HOSPITAL_COMMUNITY)
Admission: RE | Admit: 2023-04-14 | Discharge: 2023-04-14 | Disposition: A | Discharge status: Home / Self Care | Payer: Medicare Other | Source: Skilled Nursing Facility | Attending: Adult Health | Admitting: Adult Health

## 2023-04-14 DIAGNOSIS — E785 Hyperlipidemia, unspecified: Secondary | ICD-10-CM | POA: Insufficient documentation

## 2023-04-14 LAB — LIPID PANEL
Cholesterol: 126 mg/dL (ref 0–200)
HDL: 35 mg/dL — ABNORMAL LOW (ref >40)
LDL Cholesterol: 77 mg/dL (ref 0–99)
Total CHOL/HDL Ratio: 3.6 RATIO
Triglycerides: 68 mg/dL (ref <150)
VLDL: 14 mg/dL (ref 0–40)

## 2023-04-14 LAB — TSH: TSH: 0.439 u[IU]/mL (ref 0.350–4.500)

## 2023-04-14 LAB — SEDIMENTATION RATE: Sed Rate: 43 mm/hr — ABNORMAL HIGH (ref 0–22)

## 2023-04-14 LAB — VITAMIN B12: Vitamin B-12: 1102 pg/mL — ABNORMAL HIGH (ref 180–914)

## 2023-04-22 DIAGNOSIS — M3210 Systemic lupus erythematosus, organ or system involvement unspecified: Secondary | ICD-10-CM | POA: Diagnosis not present

## 2023-04-22 DIAGNOSIS — M6281 Muscle weakness (generalized): Secondary | ICD-10-CM | POA: Diagnosis not present

## 2023-04-22 DIAGNOSIS — I69398 Other sequelae of cerebral infarction: Secondary | ICD-10-CM | POA: Diagnosis not present

## 2023-04-22 DIAGNOSIS — R262 Difficulty in walking, not elsewhere classified: Secondary | ICD-10-CM | POA: Diagnosis not present

## 2023-04-23 DIAGNOSIS — I69398 Other sequelae of cerebral infarction: Secondary | ICD-10-CM | POA: Diagnosis not present

## 2023-04-23 DIAGNOSIS — M6281 Muscle weakness (generalized): Secondary | ICD-10-CM | POA: Diagnosis not present

## 2023-04-23 DIAGNOSIS — R262 Difficulty in walking, not elsewhere classified: Secondary | ICD-10-CM | POA: Diagnosis not present

## 2023-04-23 DIAGNOSIS — M3210 Systemic lupus erythematosus, organ or system involvement unspecified: Secondary | ICD-10-CM | POA: Diagnosis not present

## 2023-04-24 DIAGNOSIS — I69398 Other sequelae of cerebral infarction: Secondary | ICD-10-CM | POA: Diagnosis not present

## 2023-04-24 DIAGNOSIS — R262 Difficulty in walking, not elsewhere classified: Secondary | ICD-10-CM | POA: Diagnosis not present

## 2023-04-24 DIAGNOSIS — M6281 Muscle weakness (generalized): Secondary | ICD-10-CM | POA: Diagnosis not present

## 2023-04-24 DIAGNOSIS — M3210 Systemic lupus erythematosus, organ or system involvement unspecified: Secondary | ICD-10-CM | POA: Diagnosis not present

## 2023-04-25 DIAGNOSIS — M3210 Systemic lupus erythematosus, organ or system involvement unspecified: Secondary | ICD-10-CM | POA: Diagnosis not present

## 2023-04-25 DIAGNOSIS — M6281 Muscle weakness (generalized): Secondary | ICD-10-CM | POA: Diagnosis not present

## 2023-04-25 DIAGNOSIS — R262 Difficulty in walking, not elsewhere classified: Secondary | ICD-10-CM | POA: Diagnosis not present

## 2023-04-25 DIAGNOSIS — I69398 Other sequelae of cerebral infarction: Secondary | ICD-10-CM | POA: Diagnosis not present

## 2023-04-28 DIAGNOSIS — R262 Difficulty in walking, not elsewhere classified: Secondary | ICD-10-CM | POA: Diagnosis not present

## 2023-04-28 DIAGNOSIS — M6281 Muscle weakness (generalized): Secondary | ICD-10-CM | POA: Diagnosis not present

## 2023-04-28 DIAGNOSIS — M3210 Systemic lupus erythematosus, organ or system involvement unspecified: Secondary | ICD-10-CM | POA: Diagnosis not present

## 2023-04-28 DIAGNOSIS — I69398 Other sequelae of cerebral infarction: Secondary | ICD-10-CM | POA: Diagnosis not present

## 2023-04-29 DIAGNOSIS — M3210 Systemic lupus erythematosus, organ or system involvement unspecified: Secondary | ICD-10-CM | POA: Diagnosis not present

## 2023-04-29 DIAGNOSIS — R262 Difficulty in walking, not elsewhere classified: Secondary | ICD-10-CM | POA: Diagnosis not present

## 2023-04-29 DIAGNOSIS — M6281 Muscle weakness (generalized): Secondary | ICD-10-CM | POA: Diagnosis not present

## 2023-04-29 DIAGNOSIS — I69398 Other sequelae of cerebral infarction: Secondary | ICD-10-CM | POA: Diagnosis not present

## 2023-04-30 DIAGNOSIS — M3210 Systemic lupus erythematosus, organ or system involvement unspecified: Secondary | ICD-10-CM | POA: Diagnosis not present

## 2023-04-30 DIAGNOSIS — R262 Difficulty in walking, not elsewhere classified: Secondary | ICD-10-CM | POA: Diagnosis not present

## 2023-04-30 DIAGNOSIS — M6281 Muscle weakness (generalized): Secondary | ICD-10-CM | POA: Diagnosis not present

## 2023-04-30 DIAGNOSIS — I69398 Other sequelae of cerebral infarction: Secondary | ICD-10-CM | POA: Diagnosis not present

## 2023-05-01 DIAGNOSIS — M3210 Systemic lupus erythematosus, organ or system involvement unspecified: Secondary | ICD-10-CM | POA: Diagnosis not present

## 2023-05-01 DIAGNOSIS — R262 Difficulty in walking, not elsewhere classified: Secondary | ICD-10-CM | POA: Diagnosis not present

## 2023-05-01 DIAGNOSIS — I69398 Other sequelae of cerebral infarction: Secondary | ICD-10-CM | POA: Diagnosis not present

## 2023-05-01 DIAGNOSIS — M6281 Muscle weakness (generalized): Secondary | ICD-10-CM | POA: Diagnosis not present

## 2023-05-02 DIAGNOSIS — M3210 Systemic lupus erythematosus, organ or system involvement unspecified: Secondary | ICD-10-CM | POA: Diagnosis not present

## 2023-05-02 DIAGNOSIS — I69398 Other sequelae of cerebral infarction: Secondary | ICD-10-CM | POA: Diagnosis not present

## 2023-05-02 DIAGNOSIS — R262 Difficulty in walking, not elsewhere classified: Secondary | ICD-10-CM | POA: Diagnosis not present

## 2023-05-02 DIAGNOSIS — M6281 Muscle weakness (generalized): Secondary | ICD-10-CM | POA: Diagnosis not present

## 2023-05-05 DIAGNOSIS — M6281 Muscle weakness (generalized): Secondary | ICD-10-CM | POA: Diagnosis not present

## 2023-05-05 DIAGNOSIS — R262 Difficulty in walking, not elsewhere classified: Secondary | ICD-10-CM | POA: Diagnosis not present

## 2023-05-05 DIAGNOSIS — M3210 Systemic lupus erythematosus, organ or system involvement unspecified: Secondary | ICD-10-CM | POA: Diagnosis not present

## 2023-05-05 DIAGNOSIS — I69398 Other sequelae of cerebral infarction: Secondary | ICD-10-CM | POA: Diagnosis not present

## 2023-05-06 DIAGNOSIS — M3210 Systemic lupus erythematosus, organ or system involvement unspecified: Secondary | ICD-10-CM | POA: Diagnosis not present

## 2023-05-06 DIAGNOSIS — R262 Difficulty in walking, not elsewhere classified: Secondary | ICD-10-CM | POA: Diagnosis not present

## 2023-05-06 DIAGNOSIS — I69398 Other sequelae of cerebral infarction: Secondary | ICD-10-CM | POA: Diagnosis not present

## 2023-05-06 DIAGNOSIS — M6281 Muscle weakness (generalized): Secondary | ICD-10-CM | POA: Diagnosis not present

## 2023-05-07 ENCOUNTER — Non-Acute Institutional Stay (SKILLED_NURSING_FACILITY): Payer: Medicare Other | Admitting: Adult Health

## 2023-05-07 ENCOUNTER — Encounter: Payer: Self-pay | Admitting: Adult Health

## 2023-05-07 DIAGNOSIS — M3210 Systemic lupus erythematosus, organ or system involvement unspecified: Secondary | ICD-10-CM | POA: Diagnosis not present

## 2023-05-07 DIAGNOSIS — E538 Deficiency of other specified B group vitamins: Secondary | ICD-10-CM | POA: Diagnosis not present

## 2023-05-07 DIAGNOSIS — Z8673 Personal history of transient ischemic attack (TIA), and cerebral infarction without residual deficits: Secondary | ICD-10-CM | POA: Diagnosis not present

## 2023-05-07 DIAGNOSIS — R262 Difficulty in walking, not elsewhere classified: Secondary | ICD-10-CM | POA: Diagnosis not present

## 2023-05-07 DIAGNOSIS — E785 Hyperlipidemia, unspecified: Secondary | ICD-10-CM

## 2023-05-07 DIAGNOSIS — I69398 Other sequelae of cerebral infarction: Secondary | ICD-10-CM | POA: Diagnosis not present

## 2023-05-07 DIAGNOSIS — M6281 Muscle weakness (generalized): Secondary | ICD-10-CM | POA: Diagnosis not present

## 2023-05-08 DIAGNOSIS — E538 Deficiency of other specified B group vitamins: Secondary | ICD-10-CM | POA: Insufficient documentation

## 2023-05-08 DIAGNOSIS — M3210 Systemic lupus erythematosus, organ or system involvement unspecified: Secondary | ICD-10-CM | POA: Diagnosis not present

## 2023-05-08 DIAGNOSIS — R262 Difficulty in walking, not elsewhere classified: Secondary | ICD-10-CM | POA: Diagnosis not present

## 2023-05-08 DIAGNOSIS — I69398 Other sequelae of cerebral infarction: Secondary | ICD-10-CM | POA: Diagnosis not present

## 2023-05-08 DIAGNOSIS — M6281 Muscle weakness (generalized): Secondary | ICD-10-CM | POA: Diagnosis not present

## 2023-05-08 NOTE — Progress Notes (Signed)
Location:  [facility] Room Number: 157 Place of Service:  SNF (31)   CODE STATUS: dnr   No Known Allergies  Chief Complaint  Patient presents with   Medical Management of Chronic Issues         Vitamin B 12 deficiency:      Hyperlipidemia ldl goal <70;    History of CVA:     HPI:  She is a 67 year old long term resident of this facility being seen for the management of her chronic illnesses:  Vitamin B 12 deficiency:      Hyperlipidemia ldl goal <70;    History of CVA. She continues to get out of bed daily and is using wheelchair. She denies any pain; no changes in appetite; her weight is stable.   Past Medical History:  Diagnosis Date   Arthritis    Rheumatoid   Dysphagia    post CVA   Hypertension    Lupus (systemic lupus erythematosus) (HCC)    Seizure (HCC)    Stroke [facility])     Past Surgical History:  Procedure Laterality Date   BIOPSY  02/21/2022   Procedure: BIOPSY;  Surgeon: Lanelle Bal, DO;  [facility];  Service: Endoscopy;;   CESAREAN SECTION     COLONOSCOPY WITH PROPOFOL N/A 02/21/2022   Procedure: COLONOSCOPY WITH PROPOFOL;  Surgeon: Lanelle Bal, DO;  [facility];  Service: Endoscopy;  Laterality: N/A;  2:00pm   ESOPHAGEAL BRUSHING  02/21/2022   Procedure: ESOPHAGEAL BRUSHING;  Surgeon: Lanelle Bal, DO;  [facility];  Service: Endoscopy;;   ESOPHAGOGASTRODUODENOSCOPY (EGD) WITH PROPOFOL N/A 02/21/2022   Procedure: ESOPHAGOGASTRODUODENOSCOPY (EGD) WITH PROPOFOL;  Surgeon: Lanelle Bal, DO;  [facility];  Service: Endoscopy;  Laterality: N/A;   POLYPECTOMY  02/21/2022   Procedure: POLYPECTOMY;  Surgeon: Lanelle Bal, DO;  [facility];  Service: Endoscopy;;    Social History   Socioeconomic History   Marital status: Single    Spouse name: Not on file   Number of children: Not on file   Years of education: Not on file   Highest education level: Not on  file  Occupational History   Not on file  Tobacco Use   Smoking status: Never   Smokeless tobacco: Never  Vaping Use   Vaping status: Never Used  Substance and Sexual Activity   Alcohol use: No    Alcohol/week: 0.0 standard drinks of alcohol   Drug use: No   Sexual activity: Not on file  Other Topics Concern   Not on file  Social History Narrative   Lives with sister.     Social Determinants of Health   Financial Resource Strain: Low Risk  (12/03/2021)   Received from [facility]   Overall Financial Resource Strain (CARDIA)    Difficulty of Paying Living Expenses: Not hard at all  Food Insecurity: No Food Insecurity (05/01/2021)   [facility] from [facility]   Hunger Vital Sign  Transportation Needs: No Transportation Needs (12/04/2021)   Received [facility] - Transportation    Lack of Transportation (Medical): No    Lack of Transportation (Non-Medical): No  Physical Activity: Not on file  Stress: No Stress Concern Present (12/03/2021)   Received from [facility] of Occupational Health - Occupational Stress Questionnaire    Feeling of Stress : Not at all  Social Connections: Unknown (  01/17/2022)   Received from [facility]   Social Network    Social Network: Not on file  Intimate Partner Violence: Unknown (12/17/2021)   Received from [facility]   HITS    Physically Hurt: Not on file    Insult or Talk Down To: Not on file    Threaten Physical Harm: Not on file    Scream or Curse: Not on file   Family History  Problem Relation Age of Onset   Diabetes Mother    Hypertension Mother    Cancer Mother    Stroke Mother    Colon cancer Father    Hypertension Sister    Hypertension Sister    Hypertension Sister    Hypertension Sister    Hypertension Sister    Hypertension Sister    Hypertension Brother    Hypertension Brother       VITAL SIGNS BP 125/66   Pulse 80   Temp 98 F (36.7 C)   Resp 18   Ht 5\' 6"  (1.676  m)   Wt 143 lb 3.2 oz (65 kg)   SpO2 98%   BMI 23.11 kg/m   Outpatient Encounter Medications as of 05/07/2023  Medication Sig   acetaminophen (TYLENOL) 325 MG tablet Take 650 mg by mouth every 6 (six) hours as needed.   cyanocobalamin (VITAMIN B12) 1000 MCG tablet Take 1,000 mcg by mouth daily.   divalproex (DEPAKOTE) 500 MG DR tablet Take 1 tablet (500 mg total) by mouth 2 (two) times daily.   Ensure Plus (ENSURE PLUS) LIQD Take 237 mLs by mouth daily.   levETIRAcetam (KEPPRA) 100 MG/ML solution Take 750 mg by mouth 2 (two) times daily.   levothyroxine (SYNTHROID) 88 MCG tablet Take 88 mcg by mouth once.   melatonin 5 MG TABS Take 5 mg by mouth at bedtime.   ondansetron (ZOFRAN-ODT) 8 MG disintegrating tablet Take 8 mg by mouth every 6 (six) hours as needed for nausea or vomiting. Before meals and at bedtime   rosuvastatin (CRESTOR) 10 MG tablet Take 10 mg by mouth daily.   sennosides-docusate sodium (SENOKOT-S) 8.6-50 MG tablet Take 1 tablet by mouth 2 (two) times daily.   UNABLE TO FIND Diet: Regular diet. Send small portions.   No facility-administered encounter medications on file as of 05/07/2023.     SIGNIFICANT DIAGNOSTIC EXAMS  LABS REVIEWED:    04-25-22: glucose 75; bun 12; creat 0.92; k+ 2.6; na++ 142; ca 8.0; gfr >60;  protein 5.4; albumin 2.4. depakote 86; vitamin B 12: 421; folate 3.2; iron 33; tibc 127. 04-26-22: k+ 4.4  11-07-22: wbc 2.9; hgb 10.1; hct 31.4; mcv 101.9 plt 213;glucose 77; bun 25; creat 1.37; k+ 3.8; na++ 137; ca 8.5; gfr 42; protein 6.9 albumin 2.8; tsh 8.057; free t4: 0.75; keppra 37.7; iron 54; tibc 235; hepatitis C nr; depakote 71 keppra 37.7 chol 172 ldl 125; trig 62; hdl 35 11-14-22: keppra 34.9; vitamin B 12 197  02-13-23: tsh 0.123 chol 157; ldl 101; trig 91; hdl 38 02-17-23: glucose 72; bun 39; creat 1.52; k+ 4.0; na++ 138; ca 8.8; gfr 37; protein 7.7 albumin 3.1  tsh 0.226 chol 132; ldl 83 trig 75 hdl 34   TODAY   04-14-23: chol 126; ldl 77; trig  68; hdl 35; tsh 0.439; vitamin B12: 1102 sed rate 43    Review of Systems  Constitutional:  Negative for malaise/fatigue.  Respiratory:  Negative for cough and shortness of breath.   Cardiovascular:  Negative for chest pain,  palpitations and leg swelling.  Gastrointestinal:  Negative for abdominal pain, constipation and heartburn.  Musculoskeletal:  Negative for back pain, joint pain and myalgias.  Skin: Negative.   Neurological:  Negative for dizziness.  Psychiatric/Behavioral:  The patient is not nervous/anxious.    Physical Exam Constitutional:      General: She is not in acute distress.    Appearance: She is well-developed. She is not diaphoretic.  Neck:     Thyroid: No thyromegaly.  Cardiovascular:     Rate and Rhythm: Normal rate and regular rhythm.     Pulses: Normal pulses.     Heart sounds: Normal heart sounds.  Pulmonary:     Effort: Pulmonary effort is normal. No respiratory distress.     Breath sounds: Normal breath sounds.  Abdominal:     General: Bowel sounds are normal. There is no distension.     Palpations: Abdomen is soft.     Tenderness: There is no abdominal tenderness.  Musculoskeletal:        General: Normal range of motion.     Cervical back: Neck supple.     Right lower leg: No edema.     Left lower leg: No edema.  Lymphadenopathy:     Cervical: No cervical adenopathy.  Skin:    General: Skin is warm and dry.  Neurological:     Mental Status: She is alert. Mental status is at baseline.     Comments: 01-24-23: SLUMS 15/30  Psychiatric:        Mood and Affect: Mood normal.      ASSESSMENT/ PLAN:  TODAY  Vitamin B 12 deficiency: level 197 will continue 1,000 mcg daily   2. Hyperlipidemia ldl goal <70; ldl 77 will continue crestor 5 mg daily  3. History of CVA: is off asa and brilanta    PREVIOUS   4. Neurocognitive deficits: will monitor   5. Chronic kidney disease (CKD) stage 4 (severe) bun 39; creat 1.52; gfr 37  6. Rheumatoid  arthritis involving multiple sites with positive rheumatoid factor: is off steroids  7. 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 Will continue keppra 750 mg twice daily and depakote 500 mg twice daily   8. Protein calorie malnutrition severe: protein 7.7 albumin 3.1 will continue prostat with meals and supplements as directed   9. Benign essential hypertension: b/p 125/66 is off norvasc and atenolol  10. Systemic lupus erythematous; is off steroids  11. GERD without esophagitis: is off protonix  12. Chronic constipation: will continue senna s twice daily   13. Major depression recurrent chronic: is off zoloft    143 pounds  14. Chronic anemia: hgb 10.1  15. Thrombocytosis plt 213  16. Hypothyroidism: tsh 0.439 will continue synthroid 88 mcg daily       Rebekah Innocent NP [facility] Adult Medicine   call [phone]

## 2023-05-09 ENCOUNTER — Non-Acute Institutional Stay (SKILLED_NURSING_FACILITY): Payer: Medicare Other | Admitting: Adult Health

## 2023-05-09 ENCOUNTER — Encounter: Payer: Self-pay | Admitting: Adult Health

## 2023-05-09 DIAGNOSIS — N184 Chronic kidney disease, stage 4 (severe): Secondary | ICD-10-CM

## 2023-05-09 DIAGNOSIS — R4189 Other symptoms and signs involving cognitive functions and awareness: Secondary | ICD-10-CM

## 2023-05-09 DIAGNOSIS — R569 Unspecified convulsions: Secondary | ICD-10-CM | POA: Diagnosis not present

## 2023-05-09 DIAGNOSIS — M6281 Muscle weakness (generalized): Secondary | ICD-10-CM | POA: Diagnosis not present

## 2023-05-09 DIAGNOSIS — I69398 Other sequelae of cerebral infarction: Secondary | ICD-10-CM

## 2023-05-09 DIAGNOSIS — M3210 Systemic lupus erythematosus, organ or system involvement unspecified: Secondary | ICD-10-CM | POA: Diagnosis not present

## 2023-05-09 DIAGNOSIS — R29818 Other symptoms and signs involving the nervous system: Secondary | ICD-10-CM | POA: Diagnosis not present

## 2023-05-09 DIAGNOSIS — R262 Difficulty in walking, not elsewhere classified: Secondary | ICD-10-CM | POA: Diagnosis not present

## 2023-05-09 NOTE — Progress Notes (Signed)
Location:  [facility] Room Number: 157 Place of Service:  SNF (31)   CODE STATUS: full   No Known Allergies  Chief Complaint  Patient presents with   Acute Visit    Care plan meeting     HPI:  We have come together for her care plan meeting. BIMS 15/15 mood 0/30. SLUMS 15/30. She uses wheelchair without falls  She requires supervision to mod assist with her adls. Is continent of bladder and bowel. Dietary: weight is 143 pounds regular diet 76-100% feeds self. Therapy: ambulate 200 feet with rolling walker and contact guard. Transfers supervision; requires mod assist with stairs using quad can and handrail. Activities: does participate. 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 She is now a full code; everything is be done except for feeding tube.   Past Medical History:  Diagnosis Date   Arthritis    Rheumatoid   Dysphagia    post CVA   Hypertension    Lupus (systemic lupus erythematosus) (HCC)    Seizure (HCC)    Stroke [facility])     Past Surgical History:  Procedure Laterality Date   BIOPSY  02/21/2022   Procedure: BIOPSY;  Surgeon: Lanelle Bal, DO;  [facility]: [facility];  Service: Endoscopy;;   CESAREAN SECTION     COLONOSCOPY WITH PROPOFOL N/A 02/21/2022   Procedure: COLONOSCOPY WITH PROPOFOL;  Surgeon: Lanelle Bal, DO;  Location: [facility];  Service: Endoscopy;  Laterality: N/A;  2:00pm   ESOPHAGEAL BRUSHING  02/21/2022   Procedure: ESOPHAGEAL BRUSHING;  Surgeon: Lanelle Bal, DO;  [facility]: [facility];  Service: Endoscopy;;   ESOPHAGOGASTRODUODENOSCOPY (EGD) WITH PROPOFOL N/A 02/21/2022   Procedure: ESOPHAGOGASTRODUODENOSCOPY (EGD) WITH PROPOFOL;  Surgeon: Lanelle Bal, DO;  [facility];  Service:  Endoscopy;  Laterality: N/A;   POLYPECTOMY  02/21/2022   Procedure: POLYPECTOMY;  Surgeon: Lanelle Bal, DO;  [facility]: [facility];  Service: Endoscopy;;    Social History   Socioeconomic History   Marital status: Single    Spouse name: Not on file   Number of children: Not on file   Years of education: Not on file   Highest education level: Not on file  Occupational History   Not on file  Tobacco Use   Smoking status: Never   Smokeless tobacco: Never  Vaping Use   Vaping status: Never Used  Substance and Sexual Activity   Alcohol use: No    Alcohol/week: 0.0 standard drinks of alcohol   Drug use: No   Sexual activity: Not on file  Other Topics Concern   Not on file  Social History Narrative   Lives with sister.     Social Determinants of Health   Financial Resource Strain: Low Risk  (12/03/2021)   Received from [facility]   Overall Financial Resource Strain (CARDIA)    Difficulty of Paying Living Expenses: Not hard at all  Food Insecurity: No Food Insecurity (05/01/2021)   Received from [facility]   Hunger Vital Sign  Transportation Needs: No Transportation Needs (12/04/2021)   Received from [facility] - Transportation    Lack of Transportation (Medical): No    Lack of Transportation (Non-Medical): No  Physical Activity: Not on  file  Stress: No Stress Concern Present (12/03/2021)   Received from [facility] - Occupational Stress Questionnaire    Feeling of Stress : Not at all  Social Connections: Unknown (01/17/2022)   Received from [facility]   Social Network    Social Network: Not on file  Intimate Partner Violence: Unknown (12/17/2021)   Received from [facility]   HITS    Physically Hurt: Not on file    Insult or Talk Down To: Not on file    Threaten Physical Harm: Not on file    Scream or Curse: Not on file   Family History  Problem Relation Age of Onset   Diabetes Mother     Hypertension Mother    Cancer Mother    Stroke Mother    Colon cancer Father    Hypertension Sister    Hypertension Sister    Hypertension Sister    Hypertension Sister    Hypertension Sister    Hypertension Sister    Hypertension Brother    Hypertension Brother       VITAL SIGNS BP 125/66   Pulse 80   Temp 98 F (36.7 C)   Resp 18   Ht 5\' 6"  (1.676 m)   Wt 143 lb 3.2 oz (65 kg)   SpO2 98%   BMI 23.11 kg/m   Outpatient Encounter Medications as of 05/09/2023  Medication Sig   acetaminophen (TYLENOL) 325 MG tablet Take 650 mg by mouth every 6 (six) hours as needed.   cyanocobalamin (VITAMIN B12) 1000 MCG tablet Take 1,000 mcg by mouth daily.   divalproex (DEPAKOTE) 500 MG DR tablet Take 1 tablet (500 mg total) by mouth 2 (two) times daily.   Ensure Plus (ENSURE PLUS) LIQD Take 237 mLs by mouth daily.   levETIRAcetam (KEPPRA) 100 MG/ML solution Take 750 mg by mouth 2 (two) times daily.   levothyroxine (SYNTHROID) 88 MCG tablet Take 88 mcg by mouth once.   melatonin 5 MG TABS Take 5 mg by mouth at bedtime.   ondansetron (ZOFRAN-ODT) 8 MG disintegrating tablet Take 8 mg by mouth every 6 (six) hours as needed for nausea or vomiting. Before meals and at bedtime   rosuvastatin (CRESTOR) 10 MG tablet Take 10 mg by mouth daily.   sennosides-docusate sodium (SENOKOT-S) 8.6-50 MG tablet Take 1 tablet by mouth 2 (two) times daily.   UNABLE TO FIND Diet: Regular diet. Send small portions.   No facility-administered encounter medications on file as of 05/09/2023.     SIGNIFICANT DIAGNOSTIC EXAMS  LABS REVIEWED:    04-25-22: glucose 75; bun 12; creat 0.92; k+ 2.6; na++ 142; ca 8.0; gfr >60;  protein 5.4; albumin 2.4. depakote 86; vitamin B 12: 421; folate 3.2; iron 33; tibc 127. 04-26-22: k+ 4.4  11-07-22: wbc 2.9; hgb 10.1; hct 31.4; mcv 101.9 plt 213;glucose 77; bun 25; creat 1.37; k+ 3.8; na++ 137; ca 8.5; gfr 42; protein 6.9 albumin 2.8; tsh 8.057; free t4: 0.75; keppra 37.7; iron  54; tibc 235; hepatitis C nr; depakote 71 keppra 37.7 chol 172 ldl 125; trig 62; hdl 35 11-14-22: keppra 34.9; vitamin B 12 197  02-13-23: tsh 0.123 chol 157; ldl 101; trig 91; hdl 38 02-17-23: glucose 72; bun 39; creat 1.52; k+ 4.0; na++ 138; ca 8.8; gfr 37; protein 7.7 albumin 3.1  tsh 0.226 chol 132; ldl 83 trig 75 hdl 34  04-14-23: chol 126; ldl 77; trig 68; hdl 35; tsh 0.439; vitamin  B12: 1102 sed rate 43   NO NEW LABS.   Review of Systems  Constitutional:  Negative for malaise/fatigue.  Respiratory:  Negative for cough and shortness of breath.   Cardiovascular:  Negative for chest pain, palpitations and leg swelling.  Gastrointestinal:  Negative for abdominal pain, constipation and heartburn.  Musculoskeletal:  Negative for back pain, joint pain and myalgias.  Skin: Negative.   Neurological:  Negative for dizziness.  Psychiatric/Behavioral:  The patient is not nervous/anxious.    Physical Exam Constitutional:      General: She is not in acute distress.    Appearance: She is well-developed. She is not diaphoretic.  Neck:     Thyroid: No thyromegaly.  Cardiovascular:     Rate and Rhythm: Normal rate and regular rhythm.     Pulses: Normal pulses.     Heart sounds: Normal heart sounds.  Pulmonary:     Effort: Pulmonary effort is normal. No respiratory distress.     Breath sounds: Normal breath sounds.  Abdominal:     General: Bowel sounds are normal. There is no distension.     Palpations: Abdomen is soft.     Tenderness: There is no abdominal tenderness.  Musculoskeletal:        General: Normal range of motion.     Cervical back: Neck supple.     Right lower leg: No edema.     Left lower leg: No edema.  Lymphadenopathy:     Cervical: No cervical adenopathy.  Skin:    General: Skin is warm and dry.  Neurological:     Mental Status: She is alert. Mental status is at baseline.     Comments: 01-24-23: SLUMS 15/30.  Psychiatric:        Mood and Affect: Mood normal.       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   Time spent with patient: 40 minutes ( 20 minutes with advanced directives: MOST form filled out)    Synthia Innocent NP [facility]  call [phone]

## 2023-05-13 DIAGNOSIS — I69398 Other sequelae of cerebral infarction: Secondary | ICD-10-CM | POA: Diagnosis not present

## 2023-05-13 DIAGNOSIS — M3210 Systemic lupus erythematosus, organ or system involvement unspecified: Secondary | ICD-10-CM | POA: Diagnosis not present

## 2023-05-13 DIAGNOSIS — M6281 Muscle weakness (generalized): Secondary | ICD-10-CM | POA: Diagnosis not present

## 2023-05-13 DIAGNOSIS — R262 Difficulty in walking, not elsewhere classified: Secondary | ICD-10-CM | POA: Diagnosis not present

## 2023-05-14 DIAGNOSIS — I69398 Other sequelae of cerebral infarction: Secondary | ICD-10-CM | POA: Diagnosis not present

## 2023-05-14 DIAGNOSIS — R262 Difficulty in walking, not elsewhere classified: Secondary | ICD-10-CM | POA: Diagnosis not present

## 2023-05-14 DIAGNOSIS — M3210 Systemic lupus erythematosus, organ or system involvement unspecified: Secondary | ICD-10-CM | POA: Diagnosis not present

## 2023-05-14 DIAGNOSIS — M6281 Muscle weakness (generalized): Secondary | ICD-10-CM | POA: Diagnosis not present

## 2023-05-15 DIAGNOSIS — M3210 Systemic lupus erythematosus, organ or system involvement unspecified: Secondary | ICD-10-CM | POA: Diagnosis not present

## 2023-05-15 DIAGNOSIS — M6281 Muscle weakness (generalized): Secondary | ICD-10-CM | POA: Diagnosis not present

## 2023-05-15 DIAGNOSIS — R262 Difficulty in walking, not elsewhere classified: Secondary | ICD-10-CM | POA: Diagnosis not present

## 2023-05-15 DIAGNOSIS — I69398 Other sequelae of cerebral infarction: Secondary | ICD-10-CM | POA: Diagnosis not present

## 2023-05-16 DIAGNOSIS — I69398 Other sequelae of cerebral infarction: Secondary | ICD-10-CM | POA: Diagnosis not present

## 2023-05-16 DIAGNOSIS — M6281 Muscle weakness (generalized): Secondary | ICD-10-CM | POA: Diagnosis not present

## 2023-05-16 DIAGNOSIS — M3210 Systemic lupus erythematosus, organ or system involvement unspecified: Secondary | ICD-10-CM | POA: Diagnosis not present

## 2023-05-16 DIAGNOSIS — R262 Difficulty in walking, not elsewhere classified: Secondary | ICD-10-CM | POA: Diagnosis not present

## 2023-05-19 DIAGNOSIS — R262 Difficulty in walking, not elsewhere classified: Secondary | ICD-10-CM | POA: Diagnosis not present

## 2023-05-19 DIAGNOSIS — M6281 Muscle weakness (generalized): Secondary | ICD-10-CM | POA: Diagnosis not present

## 2023-05-19 DIAGNOSIS — M3210 Systemic lupus erythematosus, organ or system involvement unspecified: Secondary | ICD-10-CM | POA: Diagnosis not present

## 2023-05-19 DIAGNOSIS — I69398 Other sequelae of cerebral infarction: Secondary | ICD-10-CM | POA: Diagnosis not present

## 2023-05-20 ENCOUNTER — Encounter: Payer: Self-pay | Admitting: Internal Medicine

## 2023-05-20 ENCOUNTER — Non-Acute Institutional Stay (SKILLED_NURSING_FACILITY): Payer: Medicare Other | Admitting: Internal Medicine

## 2023-05-20 DIAGNOSIS — N184 Chronic kidney disease, stage 4 (severe): Secondary | ICD-10-CM

## 2023-05-20 DIAGNOSIS — R569 Unspecified convulsions: Secondary | ICD-10-CM | POA: Diagnosis not present

## 2023-05-20 DIAGNOSIS — M3210 Systemic lupus erythematosus, organ or system involvement unspecified: Secondary | ICD-10-CM | POA: Diagnosis not present

## 2023-05-20 DIAGNOSIS — R262 Difficulty in walking, not elsewhere classified: Secondary | ICD-10-CM | POA: Diagnosis not present

## 2023-05-20 DIAGNOSIS — I10 Essential (primary) hypertension: Secondary | ICD-10-CM | POA: Diagnosis not present

## 2023-05-20 DIAGNOSIS — E039 Hypothyroidism, unspecified: Secondary | ICD-10-CM | POA: Diagnosis not present

## 2023-05-20 DIAGNOSIS — R29818 Other symptoms and signs involving the nervous system: Secondary | ICD-10-CM | POA: Diagnosis not present

## 2023-05-20 DIAGNOSIS — I69398 Other sequelae of cerebral infarction: Secondary | ICD-10-CM

## 2023-05-20 DIAGNOSIS — M329 Systemic lupus erythematosus, unspecified: Secondary | ICD-10-CM | POA: Diagnosis not present

## 2023-05-20 DIAGNOSIS — M6281 Muscle weakness (generalized): Secondary | ICD-10-CM | POA: Diagnosis not present

## 2023-05-20 DIAGNOSIS — R4189 Other symptoms and signs involving cognitive functions and awareness: Secondary | ICD-10-CM

## 2023-05-20 NOTE — Progress Notes (Signed)
NURSING HOME LOCATION:  [facility] ROOM NUMBER:  [id number]  CODE STATUS: Full Code   PCP:  Synthia Innocent NP  This is a nursing facility follow up visit of chronic medical diagnoses & to document compliance with Regulation 483.30 (c) in The Long Term Care Survey Manual Phase 2 which mandates caregiver visit ( visits can alternate among physician, PA or NP as per statutes) within 10 days of 30 days / 60 days/ 90 days post admission to SNF date    Interim medical record and care since last SNF visit was updated with review of diagnostic studies and change in clinical status since last visit were documented.  HPI: She is a permanent residence facility with medical diagnoses of essential hypertension, systemic lupus erythematosus, history of seizures,history of colon polyp,and history of stroke. Lipid profile was most recently checked 7/29 revealing LDL of 77. B12 level was supranormal @ 1102. Sed rate was 43.  Her current TSH is low normal at 0.439; but the value has improved significantly from a value of 0.123 on 5/30. Most recent renal function was performed 02/17/2023 revealed progression of CKD with creatinine 1.52 and GFR of 37 indicating CKD stage IIIb.  Prior values have been 1.37 and 42.  There has been improvement serially nonalbumin with most recent values of 3.9; total protein has been normal.  Review of systems: She had been having pain in the left knee but this is better with therapy.  Her major complaint is that she is sleepy during the day as her roommate is restless at night and disturbs her sleep.  She validates occasional constipation.  She has been depressed about having to remain in the skilled facility but is hopeful her family will find a home for her in the near future and she can leave.  She states that she was diagnosed by Rheumatologist "between arthritis and lupus" 3-4 years ago.  Then she asked me " would I not have more findings if I had  lupus?"  Constitutional: No fever, significant weight change Eyes: No redness, discharge, pain, vision change ENT/mouth: No nasal congestion,  purulent discharge, earache, change in hearing, sore throat  Cardiovascular: No chest pain, palpitations, paroxysmal nocturnal dyspnea, claudication, edema  Respiratory: No cough, sputum production, hemoptysis, DOE, significant snoring, apnea   Gastrointestinal: No heartburn, dysphagia, abdominal pain, nausea /vomiting, rectal bleeding, melena Genitourinary: No dysuria, hematuria, pyuria, incontinence, nocturia Dermatologic: No rash, pruritus, change in appearance of skin Neurologic: No dizziness, headache, syncope, seizures, numbness, tingling Psychiatric: No anorexia Endocrine: No change in hair/skin/nails, excessive thirst, excessive hunger, excessive urination  Hematologic/lymphatic: No significant bruising, lymphadenopathy, abnormal bleeding Allergy/immunology: No itchy/watery eyes, significant sneezing, urticaria, angioedema  Physical exam:  Pertinent or positive findings: She is thin but appears adequately nourished.  She has somewhat of a hyperthyroid stare.  There is slight superior deviation of the left eye.  She has a cystic lesion over the right lower lid.  She has an upper partial.  First heart sound is accentuated.  Pedal pulses are decreased.  She has crepitus of both knees.  There is no significant patellar effusion present.  General appearance:  no acute distress, increased work of breathing is present.   Lymphatic: No lymphadenopathy about the head, neck, axilla. Eyes: No conjunctival inflammation or lid edema is present. There is no scleral icterus. Ears:  External ear exam shows no significant lesions or deformities.   Nose:  External nasal examination shows no deformity or inflammation. Nasal mucosa are  pink and moist without lesions, exudates Oral exam:  Lips and gums are healthy appearing. There is no oropharyngeal erythema or  exudate. Neck:  No thyromegaly, masses, tenderness noted.    Heart:  Normal rate and regular rhythm. S2 normal without gallop, murmur, click, rub .  Lungs: Chest clear to auscultation without wheezes, rhonchi, rales, rubs. Abdomen: Bowel sounds are normal. Abdomen is soft and nontender with no organomegaly, hernias, masses. GU: Deferred  Extremities:  No cyanosis, clubbing, edema  Deep tendon reflexes are equal Skin: Warm & dry w/o tenting. No significant lesions or rash.  See summary under each active problem in the Problem List with associated updated therapeutic plan

## 2023-05-20 NOTE — Assessment & Plan Note (Addendum)
Serially there has been slight progression of her CKD with most recent creatinine of 1.52 and GFR 37 indicating stage IIIb.  These values are down from prior values of 1.37 and 42.  Med list reviewed; no change in meds or dosage indicated.  Monitor renal function.

## 2023-05-20 NOTE — Patient Instructions (Signed)
See assessment and plan under each diagnosis in the problem list and acutely for this visit 

## 2023-05-20 NOTE — Assessment & Plan Note (Addendum)
She describes left knee pain which is improved with therapy.  On exam she does have crepitus without effusion.  Sed rate is 43 which would be high normal for her age.  She tells me that the Rheumatologist was not definitive as to the diagnosis of SLE.

## 2023-05-20 NOTE — Assessment & Plan Note (Addendum)
04/14/2023 TSH is low normal at 0.439.  Serially this is improved from prior TSH of 0.123.  No change in present L-thyroxine dose but monitor every 3 months to verify stability.

## 2023-05-21 NOTE — Assessment & Plan Note (Signed)
BP actually soft w/o meds;continue monitor.

## 2023-05-21 NOTE — Assessment & Plan Note (Signed)
Clinically any neurocog deficit is subtle. PCP can monitor post SNF discharge.

## 2023-05-21 NOTE — Assessment & Plan Note (Signed)
No seizures reported by staff; continue Keppra.

## 2023-06-13 ENCOUNTER — Other Ambulatory Visit: Payer: Self-pay | Admitting: Adult Health

## 2023-06-13 ENCOUNTER — Encounter: Payer: Self-pay | Admitting: Adult Health

## 2023-06-13 ENCOUNTER — Non-Acute Institutional Stay (SKILLED_NURSING_FACILITY): Payer: Medicare Other | Admitting: Adult Health

## 2023-06-13 DIAGNOSIS — M0579 Rheumatoid arthritis with rheumatoid factor of multiple sites without organ or systems involvement: Secondary | ICD-10-CM

## 2023-06-13 DIAGNOSIS — R4189 Other symptoms and signs involving cognitive functions and awareness: Secondary | ICD-10-CM

## 2023-06-13 DIAGNOSIS — N184 Chronic kidney disease, stage 4 (severe): Secondary | ICD-10-CM | POA: Diagnosis not present

## 2023-06-13 DIAGNOSIS — R29818 Other symptoms and signs involving the nervous system: Secondary | ICD-10-CM | POA: Diagnosis not present

## 2023-06-13 DIAGNOSIS — Z8673 Personal history of transient ischemic attack (TIA), and cerebral infarction without residual deficits: Secondary | ICD-10-CM

## 2023-06-13 DIAGNOSIS — R569 Unspecified convulsions: Secondary | ICD-10-CM

## 2023-06-13 MED ORDER — ROSUVASTATIN CALCIUM 10 MG PO TABS: 10.0000 mg | ORAL_TABLET | Freq: Every day | ORAL | 0 refills | Status: DC

## 2023-06-13 MED ORDER — ONDANSETRON 8 MG PO TBDP: 8.0000 mg | ORAL_TABLET | Freq: Four times a day (QID) | ORAL | 0 refills | Status: DC | PRN

## 2023-06-13 MED ORDER — LEVETIRACETAM 750 MG PO TABS: 750.0000 mg | ORAL_TABLET | Freq: Two times a day (BID) | ORAL | 0 refills | Status: AC

## 2023-06-13 MED ORDER — LEVOTHYROXINE SODIUM 88 MCG PO TABS: 88.0000 ug | ORAL_TABLET | Freq: Once | ORAL | 0 refills | Status: DC

## 2023-06-13 MED ORDER — DIVALPROEX SODIUM 500 MG PO DR TAB: 500.0000 mg | DELAYED_RELEASE_TABLET | Freq: Two times a day (BID) | ORAL | 0 refills | Status: DC

## 2023-06-13 NOTE — Progress Notes (Signed)
Location:  [facility] Room Number: 157-W Place of Service:  SNF (31)   CODE STATUS: FULL CODE  No Known Allergies  Chief Complaint  Patient presents with   Discharge Note    Discharge from SNF    HPI:  She is a very fortunate woman being discharged to home. She as originally admitted to this facility status post cva with residual seizure activity. She continued to decline. She decided to be seen by hospice to become comfort care and to stop all medications except for her seizure medication. After begin followed by hospice care; she begin to slowly improve. She began to eat and slowly gained weight. She out grew hospice care. She continued to improve. She restarted some of her medications. She is now ready for discharge to home.   Past Medical History:  Diagnosis Date   Arthritis    Rheumatoid   Dysphagia    post CVA   Hypertension    Lupus (systemic lupus erythematosus) (HCC)    Seizure (HCC)    Stroke [facility])     Past Surgical History:  Procedure Laterality Date   BIOPSY  02/21/2022   Procedure: BIOPSY;  Surgeon: Lanelle Bal, DO;  [facility];  Service: Endoscopy;;   CESAREAN SECTION     COLONOSCOPY WITH PROPOFOL N/A 02/21/2022   Procedure: COLONOSCOPY WITH PROPOFOL;  Surgeon: Lanelle Bal, DO;  [facility]: [facility];  Service: Endoscopy;  Laterality: N/A;  2:00pm   ESOPHAGEAL BRUSHING  02/21/2022   Procedure: ESOPHAGEAL BRUSHING;  Surgeon: Lanelle Bal, DO;  [facility];  Service: Endoscopy;;   ESOPHAGOGASTRODUODENOSCOPY (EGD) WITH PROPOFOL N/A 02/21/2022   Procedure: ESOPHAGOGASTRODUODENOSCOPY (EGD) WITH PROPOFOL;  Surgeon: Lanelle Bal, DO;  [facility];  Service: Endoscopy;  Laterality: N/A;   POLYPECTOMY  02/21/2022   Procedure: POLYPECTOMY;  Surgeon: Lanelle Bal, DO;  [facility];  Service: Endoscopy;;    Social History   Socioeconomic History   Marital status: Single     Spouse name: Not on file   Number of children: Not on file   Years of education: Not on file   Highest education level: Not on file  Occupational History   Not on file  Tobacco Use   Smoking status: Never   Smokeless tobacco: Never  Vaping Use   Vaping status: Never Used  Substance and Sexual Activity   Alcohol use: No    Alcohol/week: 0.0 standard drinks of alcohol   Drug use: No   Sexual activity: Not on file  Other Topics Concern   Not on file  Social History Narrative   Lives with sister.     Social Determinants of Health   Financial Resource Strain: Low Risk  (12/03/2021)   Received from [facility]   Overall Financial Resource Strain (CARDIA)    Difficulty of Paying Living Expenses: Not hard at all  Food Insecurity: No Food Insecurity (05/01/2021)   Received from [facility]   Hunger Vital Sign  Transportation Needs: No Transportation Needs (12/04/2021)   Received from [facility], [facility]   PRAPARE - Transportation    Lack of Transportation (Medical): No    Lack of Transportation (Non-Medical): No  Physical Activity: Not on file  Stress: No Stress Concern Present (12/03/2021)   Received from [facility] of Occupational Health - Occupational Stress Questionnaire    Feeling of Stress : Not  at all  Social Connections: Unknown (01/17/2022)   Received from [facility], [facility]   Social Network    Social Network: Not on file  Intimate Partner Violence: Unknown (12/17/2021)   Received from [facility], [facility]   HITS    Physically Hurt: Not on file    Insult or Talk Down To: Not on file    Threaten Physical Harm: Not on file    Scream or Curse: Not on file   Family History  Problem Relation Age of Onset   Diabetes Mother    Hypertension Mother    Cancer Mother    Stroke Mother    Colon cancer Father    Hypertension Sister    Hypertension Sister    Hypertension Sister    Hypertension  Sister    Hypertension Sister    Hypertension Sister    Hypertension Brother    Hypertension Brother       VITAL SIGNS BP 116/67   Pulse 84   Temp 97.8 F (36.6 C)   Resp 18   Ht 5\' 6"  (1.676 m)   Wt 146 lb 3.2 oz (66.3 kg)   SpO2 99%   BMI 23.60 kg/m   Outpatient Encounter Medications as of 06/13/2023  Medication Sig   acetaminophen (TYLENOL) 325 MG tablet Take 650 mg by mouth every 6 (six) hours as needed.   cyanocobalamin (VITAMIN B12) 1000 MCG tablet Take 1,000 mcg by mouth daily.   divalproex (DEPAKOTE) 500 MG DR tablet Take 1 tablet (500 mg total) by mouth 2 (two) times daily.   levETIRAcetam (KEPPRA) 100 MG/ML solution Take 750 mg by mouth 2 (two) times daily.   levothyroxine (SYNTHROID) 88 MCG tablet Take 88 mcg by mouth once.   melatonin 5 MG TABS Take 5 mg by mouth at bedtime.   rosuvastatin (CRESTOR) 10 MG tablet Take 10 mg by mouth daily.   sennosides-docusate sodium (SENOKOT-S) 8.6-50 MG tablet Take 1 tablet by mouth 2 (two) times daily.   UNABLE TO FIND Diet: Regular diet. Send small portions.   ondansetron (ZOFRAN-ODT) 8 MG disintegrating tablet Take 8 mg by mouth every 6 (six) hours as needed for nausea or vomiting. Before meals and at bedtime   [DISCONTINUED] Ensure Plus (ENSURE PLUS) LIQD Take 237 mLs by mouth daily.   No facility-administered encounter medications on file as of 06/13/2023.     SIGNIFICANT DIAGNOSTIC EXAMS  LABS REVIEWED:     11-07-22: wbc 2.9; hgb 10.1; hct 31.4; mcv 101.9 plt 213;glucose 77; bun 25; creat 1.37; k+ 3.8; na++ 137; ca 8.5; gfr 42; protein 6.9 albumin 2.8; tsh 8.057; free t4: 0.75; keppra 37.7; iron 54; tibc 235; hepatitis C nr; depakote 71 keppra 37.7 chol 172 ldl 125; trig 62; hdl 35 11-14-22: keppra 34.9; vitamin B 12 197  02-13-23: tsh 0.123 chol 157; ldl 101; trig 91; hdl 38 02-17-23: glucose 72; bun 39; creat 1.52; k+ 4.0; na++ 138; ca 8.8; gfr 37; protein 7.7 albumin 3.1  tsh 0.226 chol 132; ldl 83 trig 75 hdl 34   04-14-23: chol 126; ldl 77; trig 68; hdl 35; tsh 0.439; vitamin B12: 1102 sed rate 43   NO NEW LABS.   Review of Systems  Constitutional:  Negative for malaise/fatigue.  Respiratory:  Negative for cough and shortness of breath.   Cardiovascular:  Negative for chest pain, palpitations and leg swelling.  Gastrointestinal:  Negative for abdominal pain, constipation and heartburn.  Musculoskeletal:  Negative for back pain, joint pain and  myalgias.  Skin: Negative.   Neurological:  Negative for dizziness.  Psychiatric/Behavioral:  The patient is not nervous/anxious.    Physical Exam Constitutional:      General: She is not in acute distress.    Appearance: She is well-developed. She is not diaphoretic.  Neck:     Thyroid: No thyromegaly.  Cardiovascular:     Rate and Rhythm: Normal rate and regular rhythm.     Pulses: Normal pulses.     Heart sounds: Normal heart sounds.  Pulmonary:     Effort: Pulmonary effort is normal. No respiratory distress.     Breath sounds: Normal breath sounds.  Abdominal:     General: Bowel sounds are normal. There is no distension.     Palpations: Abdomen is soft.     Tenderness: There is no abdominal tenderness.  Musculoskeletal:        General: Normal range of motion.     Cervical back: Neck supple.     Right lower leg: No edema.     Left lower leg: No edema.  Lymphadenopathy:     Cervical: No cervical adenopathy.  Skin:    General: Skin is warm and dry.  Neurological:     Mental Status: She is alert. Mental status is at baseline.     Comments: 01-24-23: SLUMS 15/30.   Psychiatric:        Mood and Affect: Mood normal.       ASSESSMENT/ PLAN:   Patient is being discharged with the following home health services:  none needed   Patient is being discharged with the following durable medical equipment:  none needed   Patient has been advised to f/u with their PCP in 1-2 weeks to for a transitions of care visit.  Social services at their  facility was responsible for arranging this appointment.  Pt was provided with adequate prescriptions of noncontrolled medications to reach the scheduled appointment .  For controlled substances, a limited supply was provided as appropriate for the individual patient.  If the pt normally receives these medications from a pain clinic or has a contract with another physician, these medications should be received from that clinic or physician only).    A 30 day supply of her prescription medications have been sent to [facility] in [place]  Time spent with patient: 40 minutes: dme; medications; home health    Synthia Innocent NP [facility]  call [phone]

## 2023-08-04 DIAGNOSIS — R110 Nausea: Secondary | ICD-10-CM | POA: Diagnosis not present

## 2023-08-04 DIAGNOSIS — Z7689 Persons encountering health services in other specified circumstances: Secondary | ICD-10-CM | POA: Diagnosis not present

## 2023-08-04 DIAGNOSIS — N319 Neuromuscular dysfunction of bladder, unspecified: Secondary | ICD-10-CM | POA: Diagnosis not present

## 2023-08-04 DIAGNOSIS — R030 Elevated blood-pressure reading, without diagnosis of hypertension: Secondary | ICD-10-CM | POA: Diagnosis not present

## 2023-08-04 DIAGNOSIS — Z8669 Personal history of other diseases of the nervous system and sense organs: Secondary | ICD-10-CM | POA: Diagnosis not present

## 2023-08-04 DIAGNOSIS — M1990 Unspecified osteoarthritis, unspecified site: Secondary | ICD-10-CM | POA: Diagnosis not present

## 2023-08-04 DIAGNOSIS — Z713 Dietary counseling and surveillance: Secondary | ICD-10-CM | POA: Diagnosis not present

## 2023-08-04 DIAGNOSIS — E039 Hypothyroidism, unspecified: Secondary | ICD-10-CM | POA: Diagnosis not present

## 2023-08-04 DIAGNOSIS — Z8673 Personal history of transient ischemic attack (TIA), and cerebral infarction without residual deficits: Secondary | ICD-10-CM | POA: Diagnosis not present

## 2023-08-13 DIAGNOSIS — R030 Elevated blood-pressure reading, without diagnosis of hypertension: Secondary | ICD-10-CM | POA: Diagnosis not present

## 2023-08-13 DIAGNOSIS — E039 Hypothyroidism, unspecified: Secondary | ICD-10-CM | POA: Diagnosis not present

## 2023-08-21 NOTE — Therapy (Signed)
OUTPATIENT PHYSICAL THERAPY NEURO EVALUATION   Patient Name: Rebekah [name] MRN: [id number] DOB:1955-10-17, 67 y.o., female Today's Date: 08/22/2023   PCP: Rollene Rotunda MD REFERRING PROVIDER: Micael Hampshire, FNP  END OF SESSION:  PT End of Session - 08/22/23 1559     Visit Number 1    Date for PT Re-Evaluation 10/17/23    Authorization Type [employer] Dual complete    Authorization Time Period No auth no limit    Progress Note Due on Visit 10    PT Start Time 1345    PT Stop Time 1425    PT Time Calculation (min) 40 min    Equipment Utilized During Treatment Gait belt    Activity Tolerance Patient tolerated treatment well    Behavior During Therapy WFL for tasks assessed/performed             Past Medical History:  Diagnosis Date   Arthritis    Rheumatoid   Dysphagia    post CVA   Hypertension    Lupus (systemic lupus erythematosus) (HCC)    Seizure (HCC)    Stroke [facility])    Past Surgical History:  Procedure Laterality Date   BIOPSY  02/21/2022   Procedure: BIOPSY;  Surgeon: Lanelle Bal, DO;  [facility];  Service: Endoscopy;;   CESAREAN SECTION     COLONOSCOPY WITH PROPOFOL N/A 02/21/2022   Procedure: COLONOSCOPY WITH PROPOFOL;  Surgeon: Lanelle Bal, DO;  [facility];  Service: Endoscopy;  Laterality: N/A;  2:00pm   ESOPHAGEAL BRUSHING  02/21/2022   Procedure: ESOPHAGEAL BRUSHING;  Surgeon: Lanelle Bal, DO;  [facility];  Service: Endoscopy;;   ESOPHAGOGASTRODUODENOSCOPY (EGD) WITH PROPOFOL N/A 02/21/2022   Procedure: ESOPHAGOGASTRODUODENOSCOPY (EGD) WITH PROPOFOL;  Surgeon: Lanelle Bal, DO;  [facility];  Service: Endoscopy;  Laterality: N/A;   POLYPECTOMY  02/21/2022   Procedure: POLYPECTOMY;  Surgeon: Lanelle Bal, DO;  [facility];  Service: Endoscopy;;   Patient Active Problem List   Diagnosis Date Noted   Vitamin B 12 deficiency 05/08/2023   Failure to thrive in adult  04/26/2022   Oropharyngeal dysphagia 03/29/2022   Systemic lupus erythematosus (HCC) 03/29/2022   Diverticulosis 03/05/2022   Colon polyp 03/05/2022   Palpable abdominal aorta 03/05/2022   Protein-calorie malnutrition, severe (HCC) 02/20/2022   Hypokalemia 02/19/2022   Weight loss 01/15/2022   Neurocognitive deficits 01/09/2022   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) 01/09/2022   Hypothyroidism 01/09/2022   History of discoid lupus erythematosus 01/09/2022   GERD without esophagitis 01/09/2022   Chronic constipation 01/09/2022   Major depression, recurrent, chronic (HCC) 01/09/2022   Chronic anemia 01/09/2022   Thrombocytosis 01/09/2022   Hyperlipidemia LDL goal <70 01/09/2022   Iron deficiency anemia 09/29/2021   Insomnia due to medical condition 07/16/2021   Abnormality of gait as late effect of cerebrovascular accident (CVA) 07/16/2021   History of stroke 05/15/2021   Benign essential HTN 10/03/2018   Chronic kidney disease (CKD), stage IV (severe) (HCC) 10/03/2018   Rheumatoid arthritis involving multiple sites with positive rheumatoid factor (HCC) 01/16/2016    ONSET DATE: 20232  REFERRING DIAG: W09.81 (ICD-10-CM) - Personal history of transient ischemic attack (TIA), and cerebral infarction without residual deficits   THERAPY DIAG:  Personal history of transient cerebral ischemia  Impaired functional mobility, balance, gait, and endurance  Muscle weakness (generalized)  Rationale for Evaluation and Treatment: Rehabilitation  SUBJECTIVE:  SUBJECTIVE STATEMENT: Pt reporting 2 strokes in 2023. Pt reports she can walk small distances. Pt reports living with her son. Pt reports her son and daughter in law assist with some ADLs. Pt presenting in WC and rolling self  in therapy gym. Mainly uses manual WC, has RW.  Pt accompanied by: self  PERTINENT HISTORY:  2 Prior strokes in 2023.   PAIN:  Are you having pain? No  PRECAUTIONS: None  RED FLAGS: None   WEIGHT BEARING RESTRICTIONS: No  FALLS: Has patient fallen in last 6 months? No  LIVING ENVIRONMENT: Lives with: lives with their son Lives in: House/apartment Stairs: Yes: External: 4 steps; none Has following equipment at home: Single point cane, Walker - 2 wheeled, and Wheelchair (manual)  PLOF: Independent with household mobility with device, Needs assistance with ADLs, and Needs assistance with gait  PATIENT GOALS: "I want to walk again."  OBJECTIVE:  Note: Objective measures were completed at Evaluation unless otherwise noted.  DIAGNOSTIC FINDINGS:  CLINICAL DATA:  Found down, history of seizures   EXAM: CT HEAD WITHOUT CONTRAST   TECHNIQUE: Contiguous axial images were obtained from the base of the skull through the vertex without intravenous contrast.   RADIATION DOSE REDUCTION: This exam was performed according to the departmental dose-optimization program which includes automated exposure control, adjustment of the mA and/or kV according to patient size and/or use of iterative reconstruction technique.   COMPARISON:  11/30/2021   FINDINGS: Brain: No evidence of acute infarction, hemorrhage, mass, mass effect, or midline shift. No hydrocephalus or extra-axial fluid collection. Periventricular white matter changes, likely the sequela of chronic small vessel ischemic disease.   Vascular: No hyperdense vessel. Atherosclerotic calcifications in the intracranial carotid and vertebral arteries.   Skull: Normal. Negative for fracture or focal lesion. Left parietal vertex scalp hematoma.   Sinuses/Orbits: No acute finding.   Other: The mastoid air cells are well aerated.   IMPRESSION: 1.  No acute intracranial process.   2.  Left parietal vertex scalp  hematoma.  COGNITION: Overall cognitive status: Within functional limits for tasks assessed   SENSATION: WFL  COORDINATION: Finger/Nose: mild tremors at end range of reaching for finger bilaterally Supination/Pronation: intact Heel/shine test: Intact  MUSCLE TONE:   Clonus (+) R ankle  POSTURE: rounded shoulders and forward head  LOWER EXTREMITY ROM:     Active  Right Eval Left Eval  Hip flexion    Hip extension    Hip abduction    Hip adduction    Hip internal rotation    Hip external rotation    Knee flexion    Knee extension -24 from 0 -26 from 0  Ankle dorsiflexion    Ankle plantarflexion    Ankle inversion    Ankle eversion     (Blank rows = not tested)  LOWER EXTREMITY MMT:    MMT Right Eval Left Eval  Hip flexion    Hip extension    Hip abduction    Hip adduction    Hip internal rotation    Hip external rotation    Knee flexion    Knee extension 3+ 3+  Ankle dorsiflexion    Ankle plantarflexion    Ankle inversion    Ankle eversion    (Blank rows = not tested)  GAIT: Gait pattern: knee flexed in stance- Right, knee flexed in stance- Left, and shuffling Distance walked: 34ft w/ and without AD Assistive device utilized: Walker - 2 wheeled and None Level of assistance: SBA Comments:  bilateral knee flexion throughout stance and swing phase.  FUNCTIONAL TESTS:  Timed up and go (TUG): 37 seconds Berg Balance Scale: 38/56   PATIENT SURVEYS:  ABC scale 550/1600 or 34.4%  TODAY'S TREATMENT:                                                                                                                              DATE: PT Evaluation 08/22/2023    PATIENT EDUCATION: Education details: PT Evaluation, findings, prognosis, frequency, attendance policy, and HEP if given.  Person educated: Patient Education method: Explanation Education comprehension: verbalized understanding  HOME EXERCISE PROGRAM:  Access Code: [email or web address] URL:  [email or web address] Date: 08/22/2023 Prepared by: Starling Manns  Exercises - Seated Hamstring Stretch  - 1 x daily - 7 x weekly - 3 sets - 10 reps - 30 hold - Prone Press Up On Elbows  - 1 x daily - 7 x weekly - 3 sets - 10 reps  GOALS: Goals reviewed with patient? No  SHORT TERM GOALS: Target date: 09/19/23  Pt will be independent with HEP in order to demonstrate participation in Physical Therapy POC.  Baseline: Goal status: INITIAL  2.  Pt will improve ABC score by > 5 points in order to demonstrate improved pain with functional goals and outcomes Baseline:  Goal status: INITIAL  LONG TERM GOALS: Target date: 10/17/23  Pt will increase Berg Balance Scale score by > 8 points in order to demonstrate improved functional safety and balance skills in ADL/mobility.   Baseline: See objective.  Goal status: INITIAL  2.  Pt will improve TUG score by > 10 seconds in order to demonstrate improved functional mobility capacity in community setting.  Baseline: See objective.  Goal status: INITIAL  3.  Pt will improve ABC score by > 10 points in order to demonstrate improved pain with functional goals and outcomes. Baseline: See objective.  Goal status: INITIAL  4.  Pt will improve bilateral knee extension ROM by 10 degrees in order to demonstrate improved self awareness of balance deficits.   Baseline: See objective.  Goal status: INITIAL   ASSESSMENT:  CLINICAL IMPRESSION: Patient is a 67 y.o. female who was seen today for physical therapy evaluation and treatment for Z86.73 (ICD-10-CM) - Personal history of transient ischemic attack (TIA), and cerebral infarction without residual deficits. Pt's PMH per pt's report she had two Strokes in 2023, pt does show findings of balance impairments but are not symptomatics of acute lateral CVA. Potential bilateral knee flexion contractures as pt primary utilizes RW in house but can ambulate with and without AD. Pt is demonstrating  significant limitations and safety concerns in transfers, ambulation, ADLs and community access due to muscle weakness, reduced ROM, and balance deficits. Pt will benefit from skilled Physical Therapy services to address deficits/limitations in order to improve functional and QOL.    OBJECTIVE IMPAIRMENTS: Abnormal gait, decreased balance, decreased coordination, decreased mobility, difficulty walking, decreased ROM, and decreased strength.  ACTIVITY LIMITATIONS: lifting, bending, sitting, standing, stairs, bed mobility, and locomotion level  PARTICIPATION LIMITATIONS: community activity and occupation  PERSONAL FACTORS: Age are also affecting patient's functional outcome.   REHAB POTENTIAL: Good  CLINICAL DECISION MAKING: Stable/uncomplicated  EVALUATION COMPLEXITY: Low  PLAN:  PT FREQUENCY: 2x/week  PT DURATION: 8 weeks  PLANNED INTERVENTIONS: 97164- PT Re-evaluation, 97110-Therapeutic exercises, 97530- Therapeutic activity, 97112- Neuromuscular re-education, 97535- Self Care, 16109- Manual therapy, [phone]- Gait training, [phone]- Orthotic Fit/training, Patient/Family education, and Balance training  PLAN FOR NEXT SESSION: balance training, Knee extension ROM, strengthening, gait Nelida Meuse PT, DPT Physical Therapist with [facility] [phone] office   Nelida Meuse, PT 08/22/2023, 4:01 PM

## 2023-08-22 ENCOUNTER — Ambulatory Visit (HOSPITAL_COMMUNITY): Payer: 59 | Attending: Nurse Practitioner

## 2023-08-22 ENCOUNTER — Encounter (HOSPITAL_COMMUNITY): Payer: Self-pay

## 2023-08-22 DIAGNOSIS — Z7409 Other reduced mobility: Secondary | ICD-10-CM | POA: Diagnosis not present

## 2023-08-22 DIAGNOSIS — M6281 Muscle weakness (generalized): Secondary | ICD-10-CM | POA: Diagnosis not present

## 2023-08-22 DIAGNOSIS — Z8673 Personal history of transient ischemic attack (TIA), and cerebral infarction without residual deficits: Secondary | ICD-10-CM | POA: Insufficient documentation

## 2023-08-27 ENCOUNTER — Ambulatory Visit (HOSPITAL_COMMUNITY): Payer: 59

## 2023-08-27 ENCOUNTER — Encounter (HOSPITAL_COMMUNITY): Payer: Self-pay

## 2023-08-27 DIAGNOSIS — M6281 Muscle weakness (generalized): Secondary | ICD-10-CM

## 2023-08-27 DIAGNOSIS — Z8673 Personal history of transient ischemic attack (TIA), and cerebral infarction without residual deficits: Secondary | ICD-10-CM

## 2023-08-27 DIAGNOSIS — Z7409 Other reduced mobility: Secondary | ICD-10-CM

## 2023-08-27 NOTE — Therapy (Signed)
OUTPATIENT PHYSICAL THERAPY NEURO TREATMENT   Patient Name: Rebekah [name] MRN: [id number] DOB:[birth year], 67 y.o., female Today's Date: 08/27/2023   PCP: Rollene Rotunda MD REFERRING PROVIDER: Micael Hampshire, FNP  END OF SESSION:  PT End of Session - 08/27/23 1337     Visit Number 2    Number of Visits 16    Date for PT Re-Evaluation 10/17/23    Authorization Type [employer] Dual complete    Authorization Time Period No auth no limit    Progress Note Due on Visit 10    PT Start Time 1346    PT Stop Time 1430    PT Time Calculation (min) 44 min    Activity Tolerance Patient tolerated treatment well    Behavior During Therapy WFL for tasks assessed/performed             Past Medical History:  Diagnosis Date   Arthritis    Rheumatoid   Dysphagia    post CVA   Hypertension    Lupus (systemic lupus erythematosus) (HCC)    Seizure (HCC)    Stroke [facility])    Past Surgical History:  Procedure Laterality Date   BIOPSY  02/21/2022   Procedure: BIOPSY;  Surgeon: Lanelle Bal, DO;  [facility];  Service: Endoscopy;;   CESAREAN SECTION     COLONOSCOPY WITH PROPOFOL N/A 02/21/2022   Procedure: COLONOSCOPY WITH PROPOFOL;  Surgeon: Lanelle Bal, DO;  [facility];  Service: Endoscopy;  Laterality: N/A;  2:00pm   ESOPHAGEAL BRUSHING  02/21/2022   Procedure: ESOPHAGEAL BRUSHING;  Surgeon: Lanelle Bal, DO;  [facility];  Service: Endoscopy;;   ESOPHAGOGASTRODUODENOSCOPY (EGD) WITH PROPOFOL N/A 02/21/2022   Procedure: ESOPHAGOGASTRODUODENOSCOPY (EGD) WITH PROPOFOL;  Surgeon: Lanelle Bal, DO;  [facility];  Service: Endoscopy;  Laterality: N/A;   POLYPECTOMY  02/21/2022   Procedure: POLYPECTOMY;  Surgeon: Lanelle Bal, DO;  [facility];  Service: Endoscopy;;   Patient Active Problem List   Diagnosis Date Noted   Vitamin B 12 deficiency 05/08/2023   Failure to thrive in adult 04/26/2022   Oropharyngeal  dysphagia 03/29/2022   Systemic lupus erythematosus (HCC) 03/29/2022   Diverticulosis 03/05/2022   Colon polyp 03/05/2022   Palpable abdominal aorta 03/05/2022   Protein-calorie malnutrition, severe (HCC) 02/20/2022   Hypokalemia 02/19/2022   Weight loss 01/15/2022   Neurocognitive deficits 01/09/2022   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) 01/09/2022   Hypothyroidism 01/09/2022   History of discoid lupus erythematosus 01/09/2022   GERD without esophagitis 01/09/2022   Chronic constipation 01/09/2022   Major depression, recurrent, chronic (HCC) 01/09/2022   Chronic anemia 01/09/2022   Thrombocytosis 01/09/2022   Hyperlipidemia LDL goal <70 01/09/2022   Iron deficiency anemia 09/29/2021   Insomnia due to medical condition 07/16/2021   Abnormality of gait as late effect of cerebrovascular accident (CVA) 07/16/2021   History of stroke 05/15/2021   Benign essential HTN 10/03/2018   Chronic kidney disease (CKD), stage IV (severe) (HCC) 10/03/2018   Rheumatoid arthritis involving multiple sites with positive rheumatoid factor (HCC) 01/16/2016    ONSET DATE: 20232  REFERRING DIAG: W09.81 (ICD-10-CM) - Personal history of transient ischemic attack (TIA), and cerebral infarction without residual deficits   THERAPY DIAG:  Personal history of transient cerebral ischemia  Impaired functional mobility, balance, gait, and endurance  Muscle weakness (generalized)  Rationale for Evaluation and Treatment: Rehabilitation  SUBJECTIVE:  SUBJECTIVE STATEMENT: 08/27/23:  No pain or new issues.  Has began HEP daily with some discomfort on elbows during POE and has been sleeping on her stomach.    Eval:  Pt reporting 2 strokes in 2023. Pt reports she can walk small distances. Pt reports  living with her son. Pt reports her son and daughter in law assist with some ADLs. Pt presenting in WC and rolling self in therapy gym. Mainly uses manual WC, has RW.  Pt accompanied by: self  PERTINENT HISTORY:  2 Prior strokes in 2023.   PAIN:  Are you having pain? No  PRECAUTIONS: None  RED FLAGS: None   WEIGHT BEARING RESTRICTIONS: No  FALLS: Has patient fallen in last 6 months? No  LIVING ENVIRONMENT: Lives with: lives with their son Lives in: House/apartment Stairs: Yes: External: 4 steps; none Has following equipment at home: Single point cane, Walker - 2 wheeled, and Wheelchair (manual)  PLOF: Independent with household mobility with device, Needs assistance with ADLs, and Needs assistance with gait  PATIENT GOALS: "I want to walk again."  OBJECTIVE:  Note: Objective measures were completed at Evaluation unless otherwise noted.  DIAGNOSTIC FINDINGS:  CLINICAL DATA:  Found down, history of seizures   EXAM: CT HEAD WITHOUT CONTRAST   TECHNIQUE: Contiguous axial images were obtained from the base of the skull through the vertex without intravenous contrast.   RADIATION DOSE REDUCTION: This exam was performed according to the departmental dose-optimization program which includes automated exposure control, adjustment of the mA and/or kV according to patient size and/or use of iterative reconstruction technique.   COMPARISON:  11/30/2021   FINDINGS: Brain: No evidence of acute infarction, hemorrhage, mass, mass effect, or midline shift. No hydrocephalus or extra-axial fluid collection. Periventricular white matter changes, likely the sequela of chronic small vessel ischemic disease.   Vascular: No hyperdense vessel. Atherosclerotic calcifications in the intracranial carotid and vertebral arteries.   Skull: Normal. Negative for fracture or focal lesion. Left parietal vertex scalp hematoma.   Sinuses/Orbits: No acute finding.   Other: The mastoid air  cells are well aerated.   IMPRESSION: 1.  No acute intracranial process.   2.  Left parietal vertex scalp hematoma.  COGNITION: Overall cognitive status: Within functional limits for tasks assessed   SENSATION: WFL  COORDINATION: Finger/Nose: mild tremors at end range of reaching for finger bilaterally Supination/Pronation: intact Heel/shine test: Intact  MUSCLE TONE:   Clonus (+) R ankle  POSTURE: rounded shoulders and forward head  LOWER EXTREMITY ROM:     Active  Right Eval Left Eval  Hip flexion    Hip extension    Hip abduction    Hip adduction    Hip internal rotation    Hip external rotation    Knee flexion    Knee extension -24 from 0 -26 from 0  Ankle dorsiflexion    Ankle plantarflexion    Ankle inversion    Ankle eversion     (Blank rows = not tested)  LOWER EXTREMITY MMT:    MMT Right Eval Left Eval  Hip flexion    Hip extension    Hip abduction    Hip adduction    Hip internal rotation    Hip external rotation    Knee flexion    Knee extension 3+ 3+  Ankle dorsiflexion    Ankle plantarflexion    Ankle inversion    Ankle eversion    (Blank rows = not tested)  GAIT: Gait pattern: knee flexed  in stance- Right, knee flexed in stance- Left, and shuffling Distance walked: 40ft w/ and without AD Assistive device utilized: Walker - 2 wheeled and None Level of assistance: SBA Comments: bilateral knee flexion throughout stance and swing phase.  FUNCTIONAL TESTS:  Timed up and go (TUG): 37 seconds Berg Balance Scale: 38/56   PATIENT SURVEYS:  ABC scale 550/1600 or 34.4%  TODAY'S TREATMENT:                                                                                                                              DATE:  08/26/23: Reviewed goals Educated importance of HEP compliance for maximal benefits  Prone:  POE x 2' Prone knee hang x 2'  Supine: Quad sets 5x 5" Bridges 10x 5" SAQ 10x5"  STS no HHA eccentric control 10x   Hamstring stretch 2x 30"  Gait training with RW x 226' cueing for posture and heel to toe 165ft with RW  08/22/2023 PT Evaluation    PATIENT EDUCATION: Education details: PT Evaluation, findings, prognosis, frequency, attendance policy, and HEP if given.  Person educated: Patient Education method: Explanation Education comprehension: verbalized understanding  HOME EXERCISE PROGRAM:  Access Code: [email or web address] URL: [email or web address] Date: 08/22/2023 Prepared by: Starling Manns  Exercises - Seated Hamstring Stretch  - 1 x daily - 7 x weekly - 3 sets - 10 reps - 30 hold - Prone Press Up On Elbows  - 1 x daily - 7 x weekly - 3 sets - 10 reps  08/26/23: - Prone Knee Extension Hang  - 3 x daily - 7 x weekly - 1 sets - 1 reps - 120" hold - Supine Bridge  - 2 x daily - 7 x weekly - 2 sets - 10 reps - 5" hold - Sit to Stand  - 2 x daily - 7 x weekly - 2 sets - 10 reps - Supine Knee Extension Strengthening  - 2 x daily - 7 x weekly - 2 sets - 10 reps - 5" hold  GOALS: Goals reviewed with patient? No  SHORT TERM GOALS: Target date: 09/19/23  Pt will be independent with HEP in order to demonstrate participation in Physical Therapy POC.  Baseline: Goal status: IN PROGRESS  2.  Pt will improve ABC score by > 5 points in order to demonstrate improved pain with functional goals and outcomes Baseline:  Goal status: IN PROGRESS  LONG TERM GOALS: Target date: 10/17/23  Pt will increase Berg Balance Scale score by > 8 points in order to demonstrate improved functional safety and balance skills in ADL/mobility.   Baseline: See objective.  Goal status: IN PROGRESS  2.  Pt will improve TUG score by > 10 seconds in order to demonstrate improved functional mobility capacity in community setting.  Baseline: See objective.  Goal status: IN PROGRESS  3.  Pt will improve ABC score by > 10 points in order to demonstrate improved pain with functional goals and  outcomes. Baseline: See objective.  Goal status: IN PROGRESS  4.  Pt will improve bilateral knee extension ROM by 10 degrees in order to demonstrate improved self awareness of balance deficits.   Baseline: See objective.  Goal status: IN PROGRESS   ASSESSMENT:  CLINICAL IMPRESSION: 08/27/23:  Reviewed goals and educated importance of HEP compliance for maximal benefits.  Pt able to recall and demonstrate appropriate mechanics with current exercise program.  Session focus with proximal strengthening and knee mobility.  Added prone knee hang, bridges and STS to HEP for knee extension and gluteal strengthening.  Pt tolerated well to session with no reports of pain through session.    Eval:  Patient is a 68 y.o. female who was seen today for physical therapy evaluation and treatment for Z86.73 (ICD-10-CM) - Personal history of transient ischemic attack (TIA), and cerebral infarction without residual deficits. Pt's PMH per pt's report she had two Strokes in 2023, pt does show findings of balance impairments but are not symptomatics of acute lateral CVA. Potential bilateral knee flexion contractures as pt primary utilizes RW in house but can ambulate with and without AD. Pt is demonstrating significant limitations and safety concerns in transfers, ambulation, ADLs and community access due to muscle weakness, reduced ROM, and balance deficits. Pt will benefit from skilled Physical Therapy services to address deficits/limitations in order to improve functional and QOL.    OBJECTIVE IMPAIRMENTS: Abnormal gait, decreased balance, decreased coordination, decreased mobility, difficulty walking, decreased ROM, and decreased strength.   ACTIVITY LIMITATIONS: lifting, bending, sitting, standing, stairs, bed mobility, and locomotion level  PARTICIPATION LIMITATIONS: community activity and occupation  PERSONAL FACTORS: Age are also affecting patient's functional outcome.   REHAB POTENTIAL: Good  CLINICAL  DECISION MAKING: Stable/uncomplicated  EVALUATION COMPLEXITY: Low  PLAN:  PT FREQUENCY: 2x/week  PT DURATION: 8 weeks  PLANNED INTERVENTIONS: 97164- PT Re-evaluation, 97110-Therapeutic exercises, 97530- Therapeutic activity, 97112- Neuromuscular re-education, 97535- Self Care, 53664- Manual therapy, [phone]- Gait training, [phone]- Orthotic Fit/training, Patient/Family education, and Balance training  PLAN FOR NEXT SESSION: balance training, Knee extension ROM, strengthening, gait  Becky Sax, LPTA/CLT; CBIS [phone]  Juel Burrow, PTA 08/27/2023, 3:33 PM

## 2023-08-29 ENCOUNTER — Encounter (HOSPITAL_COMMUNITY): Payer: 59

## 2023-09-01 ENCOUNTER — Encounter (HOSPITAL_COMMUNITY): Payer: Self-pay

## 2023-09-01 ENCOUNTER — Ambulatory Visit (HOSPITAL_COMMUNITY): Payer: 59

## 2023-09-01 DIAGNOSIS — Z8673 Personal history of transient ischemic attack (TIA), and cerebral infarction without residual deficits: Secondary | ICD-10-CM

## 2023-09-01 DIAGNOSIS — Z7409 Other reduced mobility: Secondary | ICD-10-CM | POA: Diagnosis not present

## 2023-09-01 DIAGNOSIS — M6281 Muscle weakness (generalized): Secondary | ICD-10-CM | POA: Diagnosis not present

## 2023-09-01 NOTE — Therapy (Signed)
OUTPATIENT PHYSICAL THERAPY NEURO TREATMENT   Patient Name: Rebekah [name] MRN: [id number] DOB:1955-12-28, 67 y.o., female Today's Date: 09/01/2023   PCP: Rollene Rotunda MD REFERRING PROVIDER: Micael Hampshire, FNP  END OF SESSION:  PT End of Session - 09/01/23 1513     Visit Number 3    Number of Visits 16    Date for PT Re-Evaluation 10/17/23    Authorization Type [employer] Dual complete    Authorization Time Period No auth no limit    Progress Note Due on Visit 10    PT Start Time 1431    PT Stop Time 1510    PT Time Calculation (min) 39 min    Activity Tolerance Patient tolerated treatment well    Behavior During Therapy WFL for tasks assessed/performed              Past Medical History:  Diagnosis Date   Arthritis    Rheumatoid   Dysphagia    post CVA   Hypertension    Lupus (systemic lupus erythematosus) (HCC)    Seizure (HCC)    Stroke [facility])    Past Surgical History:  Procedure Laterality Date   BIOPSY  02/21/2022   Procedure: BIOPSY;  Surgeon: Lanelle Bal, DO;  [facility];  Service: Endoscopy;;   CESAREAN SECTION     COLONOSCOPY WITH PROPOFOL N/A 02/21/2022   Procedure: COLONOSCOPY WITH PROPOFOL;  Surgeon: Lanelle Bal, DO;  [facility];  Service: Endoscopy;  Laterality: N/A;  2:00pm   ESOPHAGEAL BRUSHING  02/21/2022   Procedure: ESOPHAGEAL BRUSHING;  Surgeon: Lanelle Bal, DO;  [facility];  Service: Endoscopy;;   ESOPHAGOGASTRODUODENOSCOPY (EGD) WITH PROPOFOL N/A 02/21/2022   Procedure: ESOPHAGOGASTRODUODENOSCOPY (EGD) WITH PROPOFOL;  Surgeon: Lanelle Bal, DO;  [facility];  Service: Endoscopy;  Laterality: N/A;   POLYPECTOMY  02/21/2022   Procedure: POLYPECTOMY;  Surgeon: Lanelle Bal, DO;  [facility];  Service: Endoscopy;;   Patient Active Problem List   Diagnosis Date Noted   Vitamin B 12 deficiency 05/08/2023   Failure to thrive in adult 04/26/2022   Oropharyngeal  dysphagia 03/29/2022   Systemic lupus erythematosus (HCC) 03/29/2022   Diverticulosis 03/05/2022   Colon polyp 03/05/2022   Palpable abdominal aorta 03/05/2022   Protein-calorie malnutrition, severe (HCC) 02/20/2022   Hypokalemia 02/19/2022   Weight loss 01/15/2022   Neurocognitive deficits 01/09/2022   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) 01/09/2022   Hypothyroidism 01/09/2022   History of discoid lupus erythematosus 01/09/2022   GERD without esophagitis 01/09/2022   Chronic constipation 01/09/2022   Major depression, recurrent, chronic (HCC) 01/09/2022   Chronic anemia 01/09/2022   Thrombocytosis 01/09/2022   Hyperlipidemia LDL goal <70 01/09/2022   Iron deficiency anemia 09/29/2021   Insomnia due to medical condition 07/16/2021   Abnormality of gait as late effect of cerebrovascular accident (CVA) 07/16/2021   History of stroke 05/15/2021   Benign essential HTN 10/03/2018   Chronic kidney disease (CKD), stage IV (severe) (HCC) 10/03/2018   Rheumatoid arthritis involving multiple sites with positive rheumatoid factor (HCC) 01/16/2016    ONSET DATE: 20232  REFERRING DIAG: N02.72 (ICD-10-CM) - Personal history of transient ischemic attack (TIA), and cerebral infarction without residual deficits   THERAPY DIAG:  Personal history of transient cerebral ischemia  Impaired functional mobility, balance, gait, and endurance  Muscle weakness (generalized)  Rationale for Evaluation and Treatment: Rehabilitation  SUBJECTIVE:  SUBJECTIVE STATEMENT: Pt reporting increased soreness after last mobility session. .  Eval:  Pt reporting 2 strokes in 2023. Pt reports she can walk small distances. Pt reports living with her son. Pt reports her son and daughter in law assist with  some ADLs. Pt presenting in WC and rolling self in therapy gym. Mainly uses manual WC, has RW.  Pt accompanied by: self  PERTINENT HISTORY:  2 Prior strokes in 2023.   PAIN:  Are you having pain? No  PRECAUTIONS: None  RED FLAGS: None   WEIGHT BEARING RESTRICTIONS: No  FALLS: Has patient fallen in last 6 months? No  LIVING ENVIRONMENT: Lives with: lives with their son Lives in: House/apartment Stairs: Yes: External: 4 steps; none Has following equipment at home: Single point cane, Walker - 2 wheeled, and Wheelchair (manual)  PLOF: Independent with household mobility with device, Needs assistance with ADLs, and Needs assistance with gait  PATIENT GOALS: "I want to walk again."  OBJECTIVE:  Note: Objective measures were completed at Evaluation unless otherwise noted.  DIAGNOSTIC FINDINGS:  CLINICAL DATA:  Found down, history of seizures   EXAM: CT HEAD WITHOUT CONTRAST   TECHNIQUE: Contiguous axial images were obtained from the base of the skull through the vertex without intravenous contrast.   RADIATION DOSE REDUCTION: This exam was performed according to the departmental dose-optimization program which includes automated exposure control, adjustment of the mA and/or kV according to patient size and/or use of iterative reconstruction technique.   COMPARISON:  11/30/2021   FINDINGS: Brain: No evidence of acute infarction, hemorrhage, mass, mass effect, or midline shift. No hydrocephalus or extra-axial fluid collection. Periventricular white matter changes, likely the sequela of chronic small vessel ischemic disease.   Vascular: No hyperdense vessel. Atherosclerotic calcifications in the intracranial carotid and vertebral arteries.   Skull: Normal. Negative for fracture or focal lesion. Left parietal vertex scalp hematoma.   Sinuses/Orbits: No acute finding.   Other: The mastoid air cells are well aerated.   IMPRESSION: 1.  No acute intracranial  process.   2.  Left parietal vertex scalp hematoma.  COGNITION: Overall cognitive status: Within functional limits for tasks assessed   SENSATION: WFL  COORDINATION: Finger/Nose: mild tremors at end range of reaching for finger bilaterally Supination/Pronation: intact Heel/shine test: Intact  MUSCLE TONE:   Clonus (+) R ankle  POSTURE: rounded shoulders and forward head  LOWER EXTREMITY ROM:     Active  Right Eval Left Eval  Hip flexion    Hip extension    Hip abduction    Hip adduction    Hip internal rotation    Hip external rotation    Knee flexion    Knee extension -24 from 0 -26 from 0  Ankle dorsiflexion    Ankle plantarflexion    Ankle inversion    Ankle eversion     (Blank rows = not tested)  LOWER EXTREMITY MMT:    MMT Right Eval Left Eval  Hip flexion    Hip extension    Hip abduction    Hip adduction    Hip internal rotation    Hip external rotation    Knee flexion    Knee extension 3+ 3+  Ankle dorsiflexion    Ankle plantarflexion    Ankle inversion    Ankle eversion    (Blank rows = not tested)  GAIT: Gait pattern: knee flexed in stance- Right, knee flexed in stance- Left, and shuffling Distance walked: 8ft w/ and without AD Assistive device  utilized: Environmental consultant - 2 wheeled and None Level of assistance: SBA Comments: bilateral knee flexion throughout stance and swing phase.  FUNCTIONAL TESTS:  Timed up and go (TUG): 37 seconds Berg Balance Scale: 38/56   PATIENT SURVEYS:  ABC scale 550/1600 or 34.4%  TODAY'S TREATMENT:                                                                                                                              DATE:  09/01/2023  -PROM of R Knee extension - 10 degrees -STM of bilateral distal hamstring and proximal gastrocnemius with 2x10' sustained max hold in TKE x 23 -Prone knee hang and POE x 3' -Seated knee bent soleus passive stretch on wedge with inferior force applied x 1' -Standing  gastrocnemius stretch 3x 1' at stairs -Standing iliopsoas stretch with tactile cues for hip translation x 1' -discussion regarding equipment at home for assist with passive mobility.  08/26/23: Reviewed goals Educated importance of HEP compliance for maximal benefits  Prone:  POE x 2' Prone knee hang x 2'  Supine: Quad sets 5x 5" Bridges 10x 5" SAQ 10x5"  STS no HHA eccentric control 10x  Hamstring stretch 2x 30"  Gait training with RW x 226' cueing for posture and heel to toe 180ft with RW  08/22/2023 PT Evaluation    PATIENT EDUCATION: Education details: PT Evaluation, findings, prognosis, frequency, attendance policy, and HEP if given.  Person educated: Patient Education method: Explanation Education comprehension: verbalized understanding  HOME EXERCISE PROGRAM:  Access Code: [email or web address] URL: [email or web address] Date: 08/22/2023 Prepared by: Starling Manns  Exercises - Seated Hamstring Stretch  - 1 x daily - 7 x weekly - 3 sets - 10 reps - 30 hold - Prone Press Up On Elbows  - 1 x daily - 7 x weekly - 3 sets - 10 reps  08/26/23: - Prone Knee Extension Hang  - 3 x daily - 7 x weekly - 1 sets - 1 reps - 120" hold - Supine Bridge  - 2 x daily - 7 x weekly - 2 sets - 10 reps - 5" hold - Sit to Stand  - 2 x daily - 7 x weekly - 2 sets - 10 reps - Supine Knee Extension Strengthening  - 2 x daily - 7 x weekly - 2 sets - 10 reps - 5" hold  GOALS: Goals reviewed with patient? No  SHORT TERM GOALS: Target date: 09/19/23  Pt will be independent with HEP in order to demonstrate participation in Physical Therapy POC.  Baseline: Goal status: IN PROGRESS  2.  Pt will improve ABC score by > 5 points in order to demonstrate improved pain with functional goals and outcomes Baseline:  Goal status: IN PROGRESS  LONG TERM GOALS: Target date: 10/17/23  Pt will increase Berg Balance Scale score by > 8 points in order to demonstrate improved functional safety  and balance skills in ADL/mobility.   Baseline: See objective.  Goal status: IN PROGRESS  2.  Pt will improve TUG score by > 10 seconds in order to demonstrate improved functional mobility capacity in community setting.  Baseline: See objective.  Goal status: IN PROGRESS  3.  Pt will improve ABC score by > 10 points in order to demonstrate improved pain with functional goals and outcomes. Baseline: See objective.  Goal status: IN PROGRESS  4.  Pt will improve bilateral knee extension ROM by 10 degrees in order to demonstrate improved self awareness of balance deficits.   Baseline: See objective.  Goal status: IN PROGRESS   ASSESSMENT:  CLINICAL IMPRESSION: Pt tolerated session well. Continues with pain in TKE mobility, demonstrating 10-15 from extension with passive assist to end range. Hard end feel with passive TKE in supine. Will continue to assess passive mobility and determine effectiveness. Pt is functionally strong but limitations in gait, transfer, and balance due to limited ROM in femorotibial joint. Continue to promote ROM and functional mobility with each intervention. Pt will benefit from skilled Physical Therapy services to address deficits/limitations in order to improve functional and QOL.    OBJECTIVE IMPAIRMENTS: Abnormal gait, decreased balance, decreased coordination, decreased mobility, difficulty walking, decreased ROM, and decreased strength.   ACTIVITY LIMITATIONS: lifting, bending, sitting, standing, stairs, bed mobility, and locomotion level  PARTICIPATION LIMITATIONS: community activity and occupation  PERSONAL FACTORS: Age are also affecting patient's functional outcome.   REHAB POTENTIAL: Good  CLINICAL DECISION MAKING: Stable/uncomplicated  EVALUATION COMPLEXITY: Low  PLAN:  PT FREQUENCY: 2x/week  PT DURATION: 8 weeks  PLANNED INTERVENTIONS: 97164- PT Re-evaluation, 97110-Therapeutic exercises, 97530- Therapeutic activity, 97112- Neuromuscular  re-education, 97535- Self Care, [id number]- Manual therapy, [phone]- Gait training, [phone]- Orthotic Fit/training, Patient/Family education, and Balance training  PLAN FOR NEXT SESSION: balance training, Knee extension ROM, strengthening, gait  Elie Goody, DPT [facility] [phone] office  Nelida Meuse, PT 09/01/2023, 3:14 PM

## 2023-09-02 DIAGNOSIS — Z8669 Personal history of other diseases of the nervous system and sense organs: Secondary | ICD-10-CM | POA: Diagnosis not present

## 2023-09-02 DIAGNOSIS — Z8673 Personal history of transient ischemic attack (TIA), and cerebral infarction without residual deficits: Secondary | ICD-10-CM | POA: Diagnosis not present

## 2023-09-02 DIAGNOSIS — R030 Elevated blood-pressure reading, without diagnosis of hypertension: Secondary | ICD-10-CM | POA: Diagnosis not present

## 2023-09-02 DIAGNOSIS — R944 Abnormal results of kidney function studies: Secondary | ICD-10-CM | POA: Diagnosis not present

## 2023-09-02 DIAGNOSIS — E039 Hypothyroidism, unspecified: Secondary | ICD-10-CM | POA: Diagnosis not present

## 2023-09-02 DIAGNOSIS — R110 Nausea: Secondary | ICD-10-CM | POA: Diagnosis not present

## 2023-09-02 DIAGNOSIS — E785 Hyperlipidemia, unspecified: Secondary | ICD-10-CM | POA: Diagnosis not present

## 2023-09-02 DIAGNOSIS — N319 Neuromuscular dysfunction of bladder, unspecified: Secondary | ICD-10-CM | POA: Diagnosis not present

## 2023-09-02 DIAGNOSIS — Z0001 Encounter for general adult medical examination with abnormal findings: Secondary | ICD-10-CM | POA: Diagnosis not present

## 2023-09-02 DIAGNOSIS — J302 Other seasonal allergic rhinitis: Secondary | ICD-10-CM | POA: Diagnosis not present

## 2023-09-03 ENCOUNTER — Other Ambulatory Visit (HOSPITAL_COMMUNITY): Payer: Self-pay | Admitting: Nurse Practitioner

## 2023-09-03 DIAGNOSIS — Z1231 Encounter for screening mammogram for malignant neoplasm of breast: Secondary | ICD-10-CM

## 2023-09-03 DIAGNOSIS — Z13820 Encounter for screening for osteoporosis: Secondary | ICD-10-CM

## 2023-09-04 ENCOUNTER — Ambulatory Visit (HOSPITAL_COMMUNITY): Payer: 59

## 2023-09-04 ENCOUNTER — Encounter (HOSPITAL_COMMUNITY): Payer: Self-pay

## 2023-09-04 DIAGNOSIS — Z7409 Other reduced mobility: Secondary | ICD-10-CM | POA: Diagnosis not present

## 2023-09-04 DIAGNOSIS — M6281 Muscle weakness (generalized): Secondary | ICD-10-CM | POA: Diagnosis not present

## 2023-09-04 DIAGNOSIS — Z8673 Personal history of transient ischemic attack (TIA), and cerebral infarction without residual deficits: Secondary | ICD-10-CM | POA: Diagnosis not present

## 2023-09-04 NOTE — Therapy (Signed)
OUTPATIENT PHYSICAL THERAPY NEURO TREATMENT   Patient Name: Rebekah [name] MRN: [id number] DOB:[birth year], 67 y.o., female Today's Date: 09/04/2023   PCP: Rollene Rotunda MD REFERRING PROVIDER: Micael Hampshire, FNP  END OF SESSION:  PT End of Session - 09/04/23 1347     Visit Number 4    Number of Visits 16    Date for PT Re-Evaluation 10/17/23    Authorization Type [employer] Dual complete    Authorization Time Period No auth no limit    Progress Note Due on Visit 10    PT Start Time 1347    PT Stop Time 1425    PT Time Calculation (min) 38 min    Equipment Utilized During Treatment --   RW, //, SBA   Activity Tolerance Patient tolerated treatment well    Behavior During Therapy WFL for tasks assessed/performed              Past Medical History:  Diagnosis Date   Arthritis    Rheumatoid   Dysphagia    post CVA   Hypertension    Lupus (systemic lupus erythematosus) (HCC)    Seizure (HCC)    Stroke [facility])    Past Surgical History:  Procedure Laterality Date   BIOPSY  02/21/2022   Procedure: BIOPSY;  Surgeon: Lanelle Bal, DO;  [facility];  Service: Endoscopy;;   CESAREAN SECTION     COLONOSCOPY WITH PROPOFOL N/A 02/21/2022   Procedure: COLONOSCOPY WITH PROPOFOL;  Surgeon: Lanelle Bal, DO;  [facility]: [facility];  Service: Endoscopy;  Laterality: N/A;  2:00pm   ESOPHAGEAL BRUSHING  02/21/2022   Procedure: ESOPHAGEAL BRUSHING;  Surgeon: Lanelle Bal, DO;  [facility];  Service: Endoscopy;;   ESOPHAGOGASTRODUODENOSCOPY (EGD) WITH PROPOFOL N/A 02/21/2022   Procedure: ESOPHAGOGASTRODUODENOSCOPY (EGD) WITH PROPOFOL;  Surgeon: Lanelle Bal, DO;  [facility];  Service: Endoscopy;  Laterality: N/A;   POLYPECTOMY  02/21/2022   Procedure: POLYPECTOMY;  Surgeon: Lanelle Bal, DO;  [facility];  Service: Endoscopy;;   Patient Active Problem List   Diagnosis Date Noted   Vitamin B 12 deficiency 05/08/2023    Failure to thrive in adult 04/26/2022   Oropharyngeal dysphagia 03/29/2022   Systemic lupus erythematosus (HCC) 03/29/2022   Diverticulosis 03/05/2022   Colon polyp 03/05/2022   Palpable abdominal aorta 03/05/2022   Protein-calorie malnutrition, severe (HCC) 02/20/2022   Hypokalemia 02/19/2022   Weight loss 01/15/2022   Neurocognitive deficits 01/09/2022   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) 01/09/2022   Hypothyroidism 01/09/2022   History of discoid lupus erythematosus 01/09/2022   GERD without esophagitis 01/09/2022   Chronic constipation 01/09/2022   Major depression, recurrent, chronic (HCC) 01/09/2022   Chronic anemia 01/09/2022   Thrombocytosis 01/09/2022   Hyperlipidemia LDL goal <70 01/09/2022   Iron deficiency anemia 09/29/2021   Insomnia due to medical condition 07/16/2021   Abnormality of gait as late effect of cerebrovascular accident (CVA) 07/16/2021   History of stroke 05/15/2021   Benign essential HTN 10/03/2018   Chronic kidney disease (CKD), stage IV (severe) (HCC) 10/03/2018   Rheumatoid arthritis involving multiple sites with positive rheumatoid factor (HCC) 01/16/2016    ONSET DATE: 20232  REFERRING DIAG: G40.10 (ICD-10-CM) - Personal history of transient ischemic attack (TIA), and cerebral infarction without residual deficits   THERAPY DIAG:  Personal history of transient cerebral ischemia  Impaired functional mobility, balance, gait, and endurance  Muscle weakness (generalized)  Rationale  for Evaluation and Treatment: Rehabilitation  SUBJECTIVE:                                                                                                                                                                                             SUBJECTIVE STATEMENT: Rt knee has some soreness today, intermittent pain today.  Reports she has began sleeping on her stomach, reports it is getting easier.   Eval:  Pt reporting 2 strokes in  2023. Pt reports she can walk small distances. Pt reports living with her son. Pt reports her son and daughter in law assist with some ADLs. Pt presenting in WC and rolling self in therapy gym. Mainly uses manual WC, has RW.  Pt accompanied by: self  PERTINENT HISTORY:  2 Prior strokes in 2023.   PAIN:  Are you having pain? No and Yes: NPRS scale: yes 3/10 Pain location: lateral Rt knee Pain description: sore Aggravating factors: woke up with legs crossed Relieving factors: reposition  PRECAUTIONS: None  RED FLAGS: None   WEIGHT BEARING RESTRICTIONS: No  FALLS: Has patient fallen in last 6 months? No  LIVING ENVIRONMENT: Lives with: lives with their son Lives in: House/apartment Stairs: Yes: External: 4 steps; none Has following equipment at home: Single point cane, Walker - 2 wheeled, and Wheelchair (manual)  PLOF: Independent with household mobility with device, Needs assistance with ADLs, and Needs assistance with gait  PATIENT GOALS: "I want to walk again."  OBJECTIVE:  Note: Objective measures were completed at Evaluation unless otherwise noted.  DIAGNOSTIC FINDINGS:  CLINICAL DATA:  Found down, history of seizures   EXAM: CT HEAD WITHOUT CONTRAST   TECHNIQUE: Contiguous axial images were obtained from the base of the skull through the vertex without intravenous contrast.   RADIATION DOSE REDUCTION: This exam was performed according to the departmental dose-optimization program which includes automated exposure control, adjustment of the mA and/or kV according to patient size and/or use of iterative reconstruction technique.   COMPARISON:  11/30/2021   FINDINGS: Brain: No evidence of acute infarction, hemorrhage, mass, mass effect, or midline shift. No hydrocephalus or extra-axial fluid collection. Periventricular white matter changes, likely the sequela of chronic small vessel ischemic disease.   Vascular: No hyperdense vessel. Atherosclerotic  calcifications in the intracranial carotid and vertebral arteries.   Skull: Normal. Negative for fracture or focal lesion. Left parietal vertex scalp hematoma.   Sinuses/Orbits: No acute finding.   Other: The mastoid air cells are well aerated.   IMPRESSION: 1.  No acute intracranial process.   2.  Left parietal vertex scalp hematoma.  COGNITION: Overall cognitive status: Within functional  limits for tasks assessed   SENSATION: WFL  COORDINATION: Finger/Nose: mild tremors at end range of reaching for finger bilaterally Supination/Pronation: intact Heel/shine test: Intact  MUSCLE TONE:   Clonus (+) R ankle  POSTURE: rounded shoulders and forward head  LOWER EXTREMITY ROM:     Active  Right Eval Left Eval  Hip flexion    Hip extension    Hip abduction    Hip adduction    Hip internal rotation    Hip external rotation    Knee flexion    Knee extension -24 from 0 -26 from 0  Ankle dorsiflexion    Ankle plantarflexion    Ankle inversion    Ankle eversion     (Blank rows = not tested)  LOWER EXTREMITY MMT:    MMT Right Eval Left Eval  Hip flexion    Hip extension    Hip abduction    Hip adduction    Hip internal rotation    Hip external rotation    Knee flexion    Knee extension 3+ 3+  Ankle dorsiflexion    Ankle plantarflexion    Ankle inversion    Ankle eversion    (Blank rows = not tested)  GAIT: Gait pattern: knee flexed in stance- Right, knee flexed in stance- Left, and shuffling Distance walked: 60ft w/ and without AD Assistive device utilized: Walker - 2 wheeled and None Level of assistance: SBA Comments: bilateral knee flexion throughout stance and swing phase.  FUNCTIONAL TESTS:  Timed up and go (TUG): 37 seconds Berg Balance Scale: 38/56   PATIENT SURVEYS:  ABC scale 550/1600 or 34.4%  TODAY'S TREATMENT:                                                                                                                               DATE:  09/04/23 -Gait training x 226 cueing for heel to toe mechanics with RW -Sit to stand 10x no HHA, eccentric control -Heel raises incline slope 10x  -Standing gastrocnemius stretch 3x 1' slant board -TKE GTB 5x 5"  -Retro gait (toe to heel) to encourage knee extension and gluteal activation 2Rt inside // bars  09/01/2023  -PROM of R Knee extension - 10 degrees -STM of bilateral distal hamstring and proximal gastrocnemius with 2x10' sustained max hold in TKE x 23 -Prone knee hang and POE x 3' -Seated knee bent soleus passive stretch on wedge with inferior force applied x 1' -Standing gastrocnemius stretch 3x 1' at stairs -Standing iliopsoas stretch with tactile cues for hip translation x 1' -discussion regarding equipment at home for assist with passive mobility.  08/26/23: Reviewed goals Educated importance of HEP compliance for maximal benefits  Prone:  POE x 2' Prone knee hang x 2'  Supine: Quad sets 5x 5" Bridges 10x 5" SAQ 10x5"  STS no HHA eccentric control 10x  Hamstring stretch 2x 30"  Gait training with RW x 226' cueing for posture and heel  to toe 174ft with RW  08/22/2023 PT Evaluation    PATIENT EDUCATION: Education details: PT Evaluation, findings, prognosis, frequency, attendance policy, and HEP if given.  Person educated: Patient Education method: Explanation Education comprehension: verbalized understanding  HOME EXERCISE PROGRAM:  Access Code: [email or web address] URL: [email or web address] Date: 08/22/2023 Prepared by: Starling Manns  Exercises - Seated Hamstring Stretch  - 1 x daily - 7 x weekly - 3 sets - 10 reps - 30 hold - Prone Press Up On Elbows  - 1 x daily - 7 x weekly - 3 sets - 10 reps  08/26/23: - Prone Knee Extension Hang  - 3 x daily - 7 x weekly - 1 sets - 1 reps - 120" hold - Supine Bridge  - 2 x daily - 7 x weekly - 2 sets - 10 reps - 5" hold - Sit to Stand  - 2 x daily - 7 x weekly - 2 sets - 10 reps - Supine  Knee Extension Strengthening  - 2 x daily - 7 x weekly - 2 sets - 10 reps - 5" hold  GOALS: Goals reviewed with patient? No  SHORT TERM GOALS: Target date: 09/19/23  Pt will be independent with HEP in order to demonstrate participation in Physical Therapy POC.  Baseline: Goal status: IN PROGRESS  2.  Pt will improve ABC score by > 5 points in order to demonstrate improved pain with functional goals and outcomes Baseline:  Goal status: IN PROGRESS  LONG TERM GOALS: Target date: 10/17/23  Pt will increase Berg Balance Scale score by > 8 points in order to demonstrate improved functional safety and balance skills in ADL/mobility.   Baseline: See objective.  Goal status: IN PROGRESS  2.  Pt will improve TUG score by > 10 seconds in order to demonstrate improved functional mobility capacity in community setting.  Baseline: See objective.  Goal status: IN PROGRESS  3.  Pt will improve ABC score by > 10 points in order to demonstrate improved pain with functional goals and outcomes. Baseline: See objective.  Goal status: IN PROGRESS  4.  Pt will improve bilateral knee extension ROM by 10 degrees in order to demonstrate improved self awareness of balance deficits.   Baseline: See objective.  Goal status: IN PROGRESS   ASSESSMENT:  CLINICAL IMPRESSION: Pt continues to arrive in [facility].  Discussed walking at home.  Pt stated she has been walking more at home with walker.  Discussed/encouraged pt to ambulate into dept vs riding in [facility].  Pt stated she has 3 stairs at home without handrails, relies on family to help her get down, encouraged pt to contact landlord for additional handrails for safety.  Session focus on proximal strengthening and improving knee extension.  Included gait training, TKE based exercises and stretches this session with good tolerance.  Pt required cueing through session to increase stride length and heel to toe mechanics with gait and to look up as constantly looking down.   No reports of pain through session.    OBJECTIVE IMPAIRMENTS: Abnormal gait, decreased balance, decreased coordination, decreased mobility, difficulty walking, decreased ROM, and decreased strength.   ACTIVITY LIMITATIONS: lifting, bending, sitting, standing, stairs, bed mobility, and locomotion level  PARTICIPATION LIMITATIONS: community activity and occupation  PERSONAL FACTORS: Age are also affecting patient's functional outcome.   REHAB POTENTIAL: Good  CLINICAL DECISION MAKING: Stable/uncomplicated  EVALUATION COMPLEXITY: Low  PLAN:  PT FREQUENCY: 2x/week  PT DURATION: 8 weeks  PLANNED INTERVENTIONS: 10272- PT Re-evaluation, 97110-Therapeutic  exercises, 97530- Therapeutic activity, [id number]- Neuromuscular re-education, [phone]- Self Care, 51884- Manual therapy, [phone]- Gait training, [phone]- Orthotic Fit/training, Patient/Family education, and Balance training  PLAN FOR NEXT SESSION: balance training, Knee extension ROM, strengthening, gait   Becky Sax, LPTA/CLT; CBIS [phone]  Juel Burrow, PTA 09/04/2023, 4:37 PM

## 2023-09-15 ENCOUNTER — Encounter (HOSPITAL_COMMUNITY): Payer: 59

## 2023-09-18 ENCOUNTER — Ambulatory Visit (HOSPITAL_COMMUNITY): Payer: 59

## 2023-09-22 ENCOUNTER — Encounter (HOSPITAL_COMMUNITY): Payer: 59

## 2023-09-25 ENCOUNTER — Encounter (HOSPITAL_COMMUNITY): Payer: 59

## 2023-09-29 ENCOUNTER — Ambulatory Visit (HOSPITAL_COMMUNITY): Payer: 59 | Attending: Nurse Practitioner

## 2023-09-29 ENCOUNTER — Encounter (HOSPITAL_COMMUNITY): Payer: Self-pay

## 2023-09-29 DIAGNOSIS — M6281 Muscle weakness (generalized): Secondary | ICD-10-CM | POA: Diagnosis present

## 2023-09-29 DIAGNOSIS — Z7409 Other reduced mobility: Secondary | ICD-10-CM | POA: Diagnosis present

## 2023-09-29 DIAGNOSIS — Z8673 Personal history of transient ischemic attack (TIA), and cerebral infarction without residual deficits: Secondary | ICD-10-CM | POA: Insufficient documentation

## 2023-09-29 NOTE — Therapy (Signed)
 OUTPATIENT PHYSICAL THERAPY NEURO TREATMENT   Patient Name: Rebekah [name] MRN: [id number] DOB:[birth year], 68 y.o., female Today's Date: 09/29/2023   PCP: Lavona Agent MD REFERRING PROVIDER: Joeann Browning, FNP  END OF SESSION:  PT End of Session - 09/29/23 1509     Visit Number 5    Number of Visits 16    Date for PT Re-Evaluation 10/17/23    Authorization Type [employer] Dual complete    Authorization Time Period No auth no limit    Progress Note Due on Visit 10    PT Start Time 1430    PT Stop Time 1510    PT Time Calculation (min) 40 min    Equipment Utilized During Treatment Gait belt    Activity Tolerance Patient tolerated treatment well    Behavior During Therapy WFL for tasks assessed/performed               Past Medical History:  Diagnosis Date   Arthritis    Rheumatoid   Dysphagia    post CVA   Hypertension    Lupus (systemic lupus erythematosus) (HCC)    Seizure (HCC)    Stroke [facility])    Past Surgical History:  Procedure Laterality Date   BIOPSY  02/21/2022   Procedure: BIOPSY;  Surgeon: Cindie Carlin POUR, DO;  [facility];  Service: Endoscopy;;   CESAREAN SECTION     COLONOSCOPY WITH PROPOFOL  N/A 02/21/2022   Procedure: COLONOSCOPY WITH PROPOFOL ;  Surgeon: Cindie Carlin POUR, DO;  Location: [facility];  Service: Endoscopy;  Laterality: N/A;  2:00pm   ESOPHAGEAL BRUSHING  02/21/2022   Procedure: ESOPHAGEAL BRUSHING;  Surgeon: Cindie Carlin POUR, DO;  [facility]: [facility];  Service: Endoscopy;;   ESOPHAGOGASTRODUODENOSCOPY (EGD) WITH PROPOFOL  N/A 02/21/2022   Procedure: ESOPHAGOGASTRODUODENOSCOPY (EGD) WITH PROPOFOL ;  Surgeon: Cindie Carlin POUR, DO;  Location: [facility];  Service: Endoscopy;  Laterality: N/A;   POLYPECTOMY  02/21/2022   Procedure: POLYPECTOMY;  Surgeon: Cindie Carlin POUR, DO;  [facility];  Service: Endoscopy;;   Patient Active Problem List   Diagnosis Date Noted   Vitamin B 12 deficiency 05/08/2023    Failure to thrive in adult 04/26/2022   Oropharyngeal dysphagia 03/29/2022   Systemic lupus erythematosus (HCC) 03/29/2022   Diverticulosis 03/05/2022   Colon polyp 03/05/2022   Palpable abdominal aorta 03/05/2022   Protein-calorie malnutrition, severe (HCC) 02/20/2022   Hypokalemia 02/19/2022   Weight loss 01/15/2022   Neurocognitive deficits 01/09/2022   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) 01/09/2022   Hypothyroidism 01/09/2022   History of discoid lupus erythematosus 01/09/2022   GERD without esophagitis 01/09/2022   Chronic constipation 01/09/2022   Major depression, recurrent, chronic (HCC) 01/09/2022   Chronic anemia 01/09/2022   Thrombocytosis 01/09/2022   Hyperlipidemia LDL goal <70 01/09/2022   Iron deficiency anemia 09/29/2021   Insomnia due to medical condition 07/16/2021   Abnormality of gait as late effect of cerebrovascular accident (CVA) 07/16/2021   History of stroke 05/15/2021   Benign essential HTN 10/03/2018   Chronic kidney disease (CKD), stage IV (severe) (HCC) 10/03/2018   Rheumatoid arthritis involving multiple sites with positive rheumatoid factor (HCC) 01/16/2016    ONSET DATE: 20232  REFERRING DIAG: S13.26 (ICD-10-CM) - Personal history of transient ischemic attack (TIA), and cerebral infarction without residual deficits   THERAPY DIAG:  Personal history of transient cerebral ischemia  Impaired functional mobility, balance, gait, and endurance  Muscle weakness (generalized)  Rationale for Evaluation  and Treatment: Rehabilitation  SUBJECTIVE:                                                                                                                                                                                             SUBJECTIVE STATEMENT: Pt discussing previous fall with RW during ambulation, fell forward and landed on knees. Reports that's been two years.   Eval:  Pt reporting 2 strokes in 2023. Pt reports  she can walk small distances. Pt reports living with her son. Pt reports her son and daughter in law assist with some ADLs. Pt presenting in WC and rolling self in therapy gym. Mainly uses manual WC, has RW.  Pt accompanied by: self  PERTINENT HISTORY:  2 Prior strokes in 2023.   PAIN:  Are you having pain? No and Yes: NPRS scale: yes 3/10 Pain location: lateral Rt knee Pain description: sore Aggravating factors: woke up with legs crossed Relieving factors: reposition  PRECAUTIONS: None  RED FLAGS: None   WEIGHT BEARING RESTRICTIONS: No  FALLS: Has patient fallen in last 6 months? No  LIVING ENVIRONMENT: Lives with: lives with their son Lives in: House/apartment Stairs: Yes: External: 4 steps; none Has following equipment at home: Single point cane, Walker - 2 wheeled, and Wheelchair (manual)  PLOF: Independent with household mobility with device, Needs assistance with ADLs, and Needs assistance with gait  PATIENT GOALS: I want to walk again.  OBJECTIVE:  Note: Objective measures were completed at Evaluation unless otherwise noted.  DIAGNOSTIC FINDINGS:  CLINICAL DATA:  Found down, history of seizures   EXAM: CT HEAD WITHOUT CONTRAST   TECHNIQUE: Contiguous axial images were obtained from the base of the skull through the vertex without intravenous contrast.   RADIATION DOSE REDUCTION: This exam was performed according to the departmental dose-optimization program which includes automated exposure control, adjustment of the mA and/or kV according to patient size and/or use of iterative reconstruction technique.   COMPARISON:  11/30/2021   FINDINGS: Brain: No evidence of acute infarction, hemorrhage, mass, mass effect, or midline shift. No hydrocephalus or extra-axial fluid collection. Periventricular white matter changes, likely the sequela of chronic small vessel ischemic disease.   Vascular: No hyperdense vessel. Atherosclerotic calcifications in the  intracranial carotid and vertebral arteries.   Skull: Normal. Negative for fracture or focal lesion. Left parietal vertex scalp hematoma.   Sinuses/Orbits: No acute finding.   Other: The mastoid air cells are well aerated.   IMPRESSION: 1.  No acute intracranial process.   2.  Left parietal vertex scalp hematoma.  COGNITION: Overall cognitive status: Within functional limits for tasks assessed  SENSATION: WFL  COORDINATION: Finger/Nose: mild tremors at end range of reaching for finger bilaterally Supination/Pronation: intact Heel/shine test: Intact  MUSCLE TONE:   Clonus (+) R ankle  POSTURE: rounded shoulders and forward head  LOWER EXTREMITY ROM:     Active  Right Eval Left Eval  Hip flexion    Hip extension    Hip abduction    Hip adduction    Hip internal rotation    Hip external rotation    Knee flexion    Knee extension -24 from 0 -26 from 0  Ankle dorsiflexion    Ankle plantarflexion    Ankle inversion    Ankle eversion     (Blank rows = not tested)  LOWER EXTREMITY MMT:    MMT Right Eval Left Eval  Hip flexion    Hip extension    Hip abduction    Hip adduction    Hip internal rotation    Hip external rotation    Knee flexion    Knee extension 3+ 3+  Ankle dorsiflexion    Ankle plantarflexion    Ankle inversion    Ankle eversion    (Blank rows = not tested)  GAIT: Gait pattern: knee flexed in stance- Right, knee flexed in stance- Left, and shuffling Distance walked: 42ft w/ and without AD Assistive device utilized: Walker - 2 wheeled and None Level of assistance: SBA Comments: bilateral knee flexion throughout stance and swing phase.  FUNCTIONAL TESTS:  Timed up and go (TUG): 37 seconds Berg Balance Scale: 38/56   PATIENT SURVEYS:  ABC scale 550/1600 or 34.4%  TODAY'S TREATMENT:                                                                                                                              DATE:  09/29/2023   -Standing iliopsoas stretch on 12 box 3 x 1' @ // bars-bilaterally -Tandem stance in heel/toe position with sustained holds 3 x 1' bilaterally with CGA for balance deficits.  -226 gait training with RW cues for heel toe pattern and increased step-length.  -4in stair training with single to no UE support min assist for maintaining balance.   09/04/23 -Gait training x 226 cueing for heel to toe mechanics with RW -Sit to stand 10x no HHA, eccentric control -Heel raises incline slope 10x  -Standing gastrocnemius stretch 3x 1' slant board -TKE GTB 5x 5  -Retro gait (toe to heel) to encourage knee extension and gluteal activation 2Rt inside // bars  09/01/2023  -PROM of R Knee extension - 10 degrees -STM of bilateral distal hamstring and proximal gastrocnemius with 2x10' sustained max hold in TKE x 23 -Prone knee hang and POE x 3' -Seated knee bent soleus passive stretch on wedge with inferior force applied x 1' -Standing gastrocnemius stretch 3x 1' at stairs -Standing iliopsoas stretch with tactile cues for hip translation x 1' -discussion regarding equipment at home for assist with passive mobility.  PATIENT EDUCATION: Education details: PT Evaluation, findings, prognosis, frequency, attendance policy, and HEP if given.  Person educated: Patient Education method: Explanation Education comprehension: verbalized understanding  HOME EXERCISE PROGRAM:  Access Code: [email or web address] URL: [email or web address] Date: 08/22/2023 Prepared by: Omega Bottcher  Exercises - Seated Hamstring Stretch  - 1 x daily - 7 x weekly - 3 sets - 10 reps - 30 hold - Prone Press Up On Elbows  - 1 x daily - 7 x weekly - 3 sets - 10 reps  08/26/23: - Prone Knee Extension Hang  - 3 x daily - 7 x weekly - 1 sets - 1 reps - 120 hold - Supine Bridge  - 2 x daily - 7 x weekly - 2 sets - 10 reps - 5 hold - Sit to Stand  - 2 x daily - 7 x weekly - 2 sets - 10 reps - Supine Knee Extension  Strengthening  - 2 x daily - 7 x weekly - 2 sets - 10 reps - 5 hold  GOALS: Goals reviewed with patient? No  SHORT TERM GOALS: Target date: 09/19/23  Pt will be independent with HEP in order to demonstrate participation in Physical Therapy POC.  Baseline: Goal status: IN PROGRESS  2.  Pt will improve ABC score by > 5 points in order to demonstrate improved pain with functional goals and outcomes Baseline:  Goal status: IN PROGRESS  LONG TERM GOALS: Target date: 10/17/23  Pt will increase Berg Balance Scale score by > 8 points in order to demonstrate improved functional safety and balance skills in ADL/mobility.   Baseline: See objective.  Goal status: IN PROGRESS  2.  Pt will improve TUG score by > 10 seconds in order to demonstrate improved functional mobility capacity in community setting.  Baseline: See objective.  Goal status: IN PROGRESS  3.  Pt will improve ABC score by > 10 points in order to demonstrate improved pain with functional goals and outcomes. Baseline: See objective.  Goal status: IN PROGRESS  4.  Pt will improve bilateral knee extension ROM by 10 degrees in order to demonstrate improved self awareness of balance deficits.   Baseline: See objective.  Goal status: IN PROGRESS   ASSESSMENT:  CLINICAL IMPRESSION: Pt tolerating session well. Educated pt on reaching to orthopedic for knee mobility deficits due to potential contractures in B hamstring due to previous immobility in setting previous neurological deficits. Pt continues with limitations due to these ROM restrictions. Pt was educated on continued safety with mobility in the home. Pt will benefit from skilled Physical Therapy services to address deficits/limitations in order to improve functional and QOL. .    OBJECTIVE IMPAIRMENTS: Abnormal gait, decreased balance, decreased coordination, decreased mobility, difficulty walking, decreased ROM, and decreased strength.   ACTIVITY LIMITATIONS: lifting,  bending, sitting, standing, stairs, bed mobility, and locomotion level  PARTICIPATION LIMITATIONS: community activity and occupation  PERSONAL FACTORS: Age are also affecting patient's functional outcome.   REHAB POTENTIAL: Good  CLINICAL DECISION MAKING: Stable/uncomplicated  EVALUATION COMPLEXITY: Low  PLAN:  PT FREQUENCY: 2x/week  PT DURATION: 8 weeks  PLANNED INTERVENTIONS: 97164- PT Re-evaluation, 97110-Therapeutic exercises, 97530- Therapeutic activity, 97112- Neuromuscular re-education, 97535- Self Care, 02859- Manual therapy, [phone]- Gait training, [phone]- Orthotic Fit/training, Patient/Family education, and Balance training  PLAN FOR NEXT SESSION: balance training, Knee extension ROM, strengthening, gait   Omega JONETTA Bottcher ALMETA, DPT [facility] [place] [phone] office  Omega JONETTA Bottcher, PT 09/29/2023, 3:11 PM

## 2023-10-02 ENCOUNTER — Encounter (HOSPITAL_COMMUNITY): Payer: 59

## 2023-10-03 ENCOUNTER — Emergency Department (HOSPITAL_COMMUNITY): Payer: 59

## 2023-10-03 ENCOUNTER — Observation Stay (HOSPITAL_COMMUNITY)
Admission: EM | Admit: 2023-10-03 | Discharge: 2023-10-05 | Disposition: A | Discharge status: Home / Self Care | Payer: 59 | Attending: Family Medicine | Admitting: Family Medicine

## 2023-10-03 ENCOUNTER — Encounter (HOSPITAL_COMMUNITY): Payer: Self-pay

## 2023-10-03 ENCOUNTER — Other Ambulatory Visit: Payer: Self-pay

## 2023-10-03 DIAGNOSIS — B349 Viral infection, unspecified: Secondary | ICD-10-CM | POA: Diagnosis not present

## 2023-10-03 DIAGNOSIS — R55 Syncope and collapse: Principal | ICD-10-CM | POA: Diagnosis present

## 2023-10-03 DIAGNOSIS — Z8673 Personal history of transient ischemic attack (TIA), and cerebral infarction without residual deficits: Secondary | ICD-10-CM | POA: Diagnosis not present

## 2023-10-03 DIAGNOSIS — R791 Abnormal coagulation profile: Secondary | ICD-10-CM | POA: Diagnosis not present

## 2023-10-03 DIAGNOSIS — R2689 Other abnormalities of gait and mobility: Secondary | ICD-10-CM | POA: Diagnosis not present

## 2023-10-03 DIAGNOSIS — M6281 Muscle weakness (generalized): Secondary | ICD-10-CM | POA: Insufficient documentation

## 2023-10-03 DIAGNOSIS — Z1152 Encounter for screening for COVID-19: Secondary | ICD-10-CM | POA: Diagnosis not present

## 2023-10-03 DIAGNOSIS — R911 Solitary pulmonary nodule: Secondary | ICD-10-CM | POA: Diagnosis not present

## 2023-10-03 DIAGNOSIS — E039 Hypothyroidism, unspecified: Secondary | ICD-10-CM | POA: Insufficient documentation

## 2023-10-03 DIAGNOSIS — R2681 Unsteadiness on feet: Secondary | ICD-10-CM | POA: Insufficient documentation

## 2023-10-03 DIAGNOSIS — N183 Chronic kidney disease, stage 3 unspecified: Secondary | ICD-10-CM | POA: Diagnosis not present

## 2023-10-03 DIAGNOSIS — R509 Fever, unspecified: Secondary | ICD-10-CM | POA: Diagnosis not present

## 2023-10-03 DIAGNOSIS — I129 Hypertensive chronic kidney disease with stage 1 through stage 4 chronic kidney disease, or unspecified chronic kidney disease: Secondary | ICD-10-CM | POA: Insufficient documentation

## 2023-10-03 DIAGNOSIS — N390 Urinary tract infection, site not specified: Secondary | ICD-10-CM | POA: Insufficient documentation

## 2023-10-03 DIAGNOSIS — G40909 Epilepsy, unspecified, not intractable, without status epilepticus: Secondary | ICD-10-CM | POA: Diagnosis not present

## 2023-10-03 DIAGNOSIS — R5381 Other malaise: Secondary | ICD-10-CM | POA: Insufficient documentation

## 2023-10-03 DIAGNOSIS — M329 Systemic lupus erythematosus, unspecified: Secondary | ICD-10-CM | POA: Diagnosis not present

## 2023-10-03 LAB — COMPREHENSIVE METABOLIC PANEL
ALT: 18 U/L (ref 0–44)
AST: 26 U/L (ref 15–41)
Albumin: 3.1 g/dL — ABNORMAL LOW (ref 3.5–5.0)
Alkaline Phosphatase: 55 U/L (ref 38–126)
Anion gap: 8 (ref 5–15)
BUN: 21 mg/dL (ref 8–23)
CO2: 20 mmol/L — ABNORMAL LOW (ref 22–32)
Calcium: 8.7 mg/dL — ABNORMAL LOW (ref 8.9–10.3)
Chloride: 105 mmol/L (ref 98–111)
Creatinine, Ser: 1.42 mg/dL — ABNORMAL HIGH (ref 0.44–1.00)
GFR, Estimated: 40 mL/min — ABNORMAL LOW (ref >60)
Glucose, Bld: 121 mg/dL — ABNORMAL HIGH (ref 70–99)
Potassium: 3.7 mmol/L (ref 3.5–5.1)
Sodium: 133 mmol/L — ABNORMAL LOW (ref 135–145)
Total Bilirubin: 0.5 mg/dL (ref 0.0–1.2)
Total Protein: 7.8 g/dL (ref 6.5–8.1)

## 2023-10-03 LAB — URINALYSIS, ROUTINE W REFLEX MICROSCOPIC
Bilirubin Urine: NEGATIVE
Glucose, UA: NEGATIVE mg/dL
Hgb urine dipstick: NEGATIVE
Ketones, ur: NEGATIVE mg/dL
Nitrite: POSITIVE — AB
Protein, ur: 30 mg/dL — AB
Specific Gravity, Urine: 1.011 (ref 1.005–1.030)
pH: 6 (ref 5.0–8.0)

## 2023-10-03 LAB — D-DIMER, QUANTITATIVE: D-Dimer, Quant: 5.22 ug{FEU}/mL — ABNORMAL HIGH (ref 0.00–0.50)

## 2023-10-03 LAB — CBC WITH DIFFERENTIAL/PLATELET
Abs Immature Granulocytes: 0.04 10*3/uL (ref 0.00–0.07)
Basophils Absolute: 0 10*3/uL (ref 0.0–0.1)
Basophils Relative: 0 %
Eosinophils Absolute: 0 10*3/uL (ref 0.0–0.5)
Eosinophils Relative: 0 %
HCT: 36.9 % (ref 36.0–46.0)
Hemoglobin: 11.7 g/dL — ABNORMAL LOW (ref 12.0–15.0)
Immature Granulocytes: 1 %
Lymphocytes Relative: 12 %
Lymphs Abs: 0.6 10*3/uL — ABNORMAL LOW (ref 0.7–4.0)
MCH: 30.2 pg (ref 26.0–34.0)
MCHC: 31.7 g/dL (ref 30.0–36.0)
MCV: 95.1 fL (ref 80.0–100.0)
Monocytes Absolute: 0.1 10*3/uL (ref 0.1–1.0)
Monocytes Relative: 3 %
Neutro Abs: 4 10*3/uL (ref 1.7–7.7)
Neutrophils Relative %: 84 %
Platelets: 376 10*3/uL (ref 150–400)
RBC: 3.88 MIL/uL (ref 3.87–5.11)
RDW: 13.3 % (ref 11.5–15.5)
WBC: 4.8 10*3/uL (ref 4.0–10.5)
nRBC: 0 % (ref 0.0–0.2)

## 2023-10-03 LAB — RESP PANEL BY RT-PCR (RSV, FLU A&B, COVID)  RVPGX2
Influenza A by PCR: NEGATIVE
Influenza B by PCR: NEGATIVE
Resp Syncytial Virus by PCR: NEGATIVE
SARS Coronavirus 2 by RT PCR: NEGATIVE

## 2023-10-03 LAB — TROPONIN I (HIGH SENSITIVITY)
Troponin I (High Sensitivity): 7 ng/L (ref <18)
Troponin I (High Sensitivity): 8 ng/L (ref <18)

## 2023-10-03 MED ORDER — IOHEXOL 350 MG/ML SOLN
75.0000 mL | Freq: Once | INTRAVENOUS | Status: AC | PRN
  Administered 2023-10-03: 75 mL via INTRAVENOUS

## 2023-10-03 MED ORDER — SODIUM CHLORIDE 0.9 % IV BOLUS
1000.0000 mL | Freq: Once | INTRAVENOUS | Status: AC
  Administered 2023-10-03: 1000 mL via INTRAVENOUS
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
## 2023-10-03 NOTE — ED Notes (Signed)
Patient attempted to give urine sample, with no success will try again.
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
## 2023-10-03 NOTE — ED Notes (Signed)
ED Provider at bedside. 

## 2023-10-04 ENCOUNTER — Observation Stay (HOSPITAL_COMMUNITY): Payer: 59

## 2023-10-04 DIAGNOSIS — R55 Syncope and collapse: Secondary | ICD-10-CM | POA: Diagnosis not present

## 2023-10-04 LAB — RESPIRATORY PANEL BY PCR

## 2023-10-04 LAB — BASIC METABOLIC PANEL
Anion gap: 8 (ref 5–15)
BUN: 17 mg/dL (ref 8–23)
CO2: 21 mmol/L — ABNORMAL LOW (ref 22–32)
Calcium: 8.5 mg/dL — ABNORMAL LOW (ref 8.9–10.3)
Chloride: 105 mmol/L (ref 98–111)
Creatinine, Ser: 1.21 mg/dL — ABNORMAL HIGH (ref 0.44–1.00)
GFR, Estimated: 49 mL/min — ABNORMAL LOW (ref >60)
Glucose, Bld: 91 mg/dL (ref 70–99)
Potassium: 3.9 mmol/L (ref 3.5–5.1)
Sodium: 134 mmol/L — ABNORMAL LOW (ref 135–145)

## 2023-10-04 LAB — MAGNESIUM: Magnesium: 1.8 mg/dL (ref 1.7–2.4)

## 2023-10-04 LAB — URINALYSIS, ROUTINE W REFLEX MICROSCOPIC
Bilirubin Urine: NEGATIVE
Glucose, UA: NEGATIVE mg/dL
Hgb urine dipstick: NEGATIVE
Ketones, ur: NEGATIVE mg/dL
Nitrite: NEGATIVE
Protein, ur: 30 mg/dL — AB
Specific Gravity, Urine: 1.036 — ABNORMAL HIGH (ref 1.005–1.030)
WBC, UA: >50 WBC/hpf (ref 0–5)
pH: 6 (ref 5.0–8.0)

## 2023-10-04 LAB — TSH: TSH: 1.948 u[IU]/mL (ref 0.350–4.500)

## 2023-10-04 LAB — PHOSPHORUS: Phosphorus: 3.1 mg/dL (ref 2.5–4.6)

## 2023-10-04 LAB — HIV ANTIBODY (ROUTINE TESTING W REFLEX): HIV Screen 4th Generation wRfx: NONREACTIVE

## 2023-10-04 MED ORDER — ENOXAPARIN SODIUM 40 MG/0.4ML IJ SOSY
40.0000 mg | PREFILLED_SYRINGE | INTRAMUSCULAR | Status: DC
  Administered 2023-10-04 – 2023-10-05 (×2): 40 mg via SUBCUTANEOUS
  Filled 2023-10-04 (×2): qty 0.4

## 2023-10-04 MED ORDER — ACETAMINOPHEN 500 MG PO TABS
1000.0000 mg | ORAL_TABLET | Freq: Four times a day (QID) | ORAL | Status: DC | PRN
  Administered 2023-10-04 (×2): 1000 mg via ORAL
  Filled 2023-10-04 (×2): qty 2

## 2023-10-04 MED ORDER — LEVOTHYROXINE SODIUM 75 MCG PO TABS
75.0000 ug | ORAL_TABLET | Freq: Every day | ORAL | Status: DC
  Administered 2023-10-04 (×2): 75 ug via ORAL
  Filled 2023-10-04 (×2): qty 1

## 2023-10-04 MED ORDER — SODIUM CHLORIDE 0.9% FLUSH
3.0000 mL | Freq: Two times a day (BID) | INTRAVENOUS | Status: DC
Start: 2023-10-04 — End: 2023-10-05
  Administered 2023-10-04 (×3): 3 mL via INTRAVENOUS

## 2023-10-04 MED ORDER — MUSCLE RUB 10-15 % EX CREA: TOPICAL_CREAM | CUTANEOUS | Status: DC | PRN

## 2023-10-04 MED ORDER — LACTATED RINGERS IV BOLUS
500.0000 mL | Freq: Once | INTRAVENOUS | Status: AC
  Administered 2023-10-04: 500 mL via INTRAVENOUS

## 2023-10-04 MED ORDER — MELATONIN 3 MG PO TABS
6.0000 mg | ORAL_TABLET | Freq: Every evening | ORAL | Status: DC | PRN
  Administered 2023-10-04 (×2): 6 mg via ORAL
  Filled 2023-10-04 (×2): qty 2

## 2023-10-04 MED ORDER — SODIUM CHLORIDE 0.9 % IV SOLN
1.0000 g | INTRAVENOUS | Status: DC
  Administered 2023-10-04 – 2023-10-05 (×2): 1 g via INTRAVENOUS
  Filled 2023-10-04 (×2): qty 10

## 2023-10-04 MED ORDER — DICLOFENAC SODIUM 1 % EX GEL
2.0000 g | Freq: Four times a day (QID) | CUTANEOUS | Status: DC
  Administered 2023-10-04 – 2023-10-05 (×4): 2 g via TOPICAL
  Filled 2023-10-04: qty 100

## 2023-10-04 MED ORDER — MELATONIN 5 MG PO TABS
5.0000 mg | ORAL_TABLET | Freq: Every day | ORAL | Status: DC
  Filled 2023-10-04 (×3): qty 1

## 2023-10-04 MED ORDER — LEVETIRACETAM 500 MG PO TABS
750.0000 mg | ORAL_TABLET | Freq: Two times a day (BID) | ORAL | Status: DC
  Administered 2023-10-04 – 2023-10-05 (×4): 750 mg via ORAL
  Filled 2023-10-04 (×4): qty 1

## 2023-10-04 MED ORDER — ALBUTEROL SULFATE (2.5 MG/3ML) 0.083% IN NEBU: 2.5000 mg | INHALATION_SOLUTION | RESPIRATORY_TRACT | Status: DC | PRN

## 2023-10-04 MED ORDER — ROSUVASTATIN CALCIUM 20 MG PO TABS
20.0000 mg | ORAL_TABLET | Freq: Every day | ORAL | Status: DC
Start: 2023-10-04 — End: 2023-10-05
  Administered 2023-10-04 – 2023-10-05 (×2): 20 mg via ORAL
  Filled 2023-10-04 (×2): qty 1

## 2023-10-04 MED ORDER — METOPROLOL SUCCINATE ER 50 MG PO TB24
50.0000 mg | ORAL_TABLET | Freq: Every day | ORAL | Status: DC
Start: 2023-10-04 — End: 2023-10-05
  Administered 2023-10-04 – 2023-10-05 (×2): 50 mg via ORAL
  Filled 2023-10-04 (×2): qty 1

## 2023-10-04 NOTE — Progress Notes (Signed)
Vital signs taken prior to ambulating to bathroom by NT, this nurse was notified of temperature and heart rate elevation. Dr. Lazarus Salines updated of Yellow MEWS parameters and that tylenol will be given once back to bed, per Dr. Lazarus Salines no further action for now, of note patient had received her Ceftriaxone for UTI today. Upon return to bed, tylenol was administered to patient.   10/04/23 2106  Assess: MEWS Score  Temp (!) 101.1 F (38.4 C) (told rn temp is up)  BP (!) 150/88  Pulse Rate (!) 108  Resp 20  SpO2 98 %  O2 Device Room Air  Assess: MEWS Score  MEWS Temp 1  MEWS Systolic 0  MEWS Pulse 1  MEWS RR 0  MEWS LOC 0  MEWS Score 2  MEWS Score Color Yellow  Assess: if the MEWS score is Yellow or Red  Were vital signs accurate and taken at a resting state? Yes  Does the patient meet 2 or more of the SIRS criteria? Yes  Does the patient have a confirmed or suspected source of infection? Yes  MEWS guidelines implemented  Yes, yellow  Treat  MEWS Interventions Considered administering scheduled or prn medications/treatments as ordered  Take Vital Signs  Increase Vital Sign Frequency  Yellow: Q2hr x1, continue Q4hrs until patient remains green for 12hrs  Escalate  MEWS: Escalate Yellow: Discuss with charge nurse and consider notifying provider and/or RRT  Notify: Charge Nurse/RN  Name of Charge Nurse/RN Notified Education officer, community  Provider Notification  Provider Name/Title Dr. Lazarus Salines  Date Provider Notified 10/04/23  Time Provider Notified 2114  Method of Notification Page  Notification Reason  (Yellow MEWS)  Provider response No new orders  Date of Provider Response 10/04/23  Time of Provider Response 2114  Assess: SIRS CRITERIA  SIRS Temperature  1  SIRS Respirations  0  SIRS Pulse 1  SIRS WBC 0  SIRS Score Sum  2

## 2023-10-04 NOTE — H&P (Addendum)
History and Physical    CASSI BONEY ZOX:[id number] DOB: [birth year] DOA: 10/03/2023  PCP: Benita Stabile, MD   Patient coming from: ? LTC    Chief Complaint:  Chief Complaint  Patient presents with   Weakness   Loss of Consciousness    HPI:  Rebekah [name] is a 68 y.o. female with hx of hypertension, SLE, seizure disorder, CVA, who was brought in by EMS after syncopal episode that occurred while at a restaurant.  Reports in the past few weeks she has been feeling overall weak.  Gets around with the use of a rollator.  Recently also having cough at bedtime, urinary frequency.  Also had an episode of diarrhea today.  She was out at [employer] and began to feel nauseous and overall unwell.  She does not recall what happened next but apparently she had a syncopal episode slumped over.  EMS who were eating at the restaurant to assist her, advised to go to the ED.  No history of recent chest pain, palpitations during the event.  She does have remote history of generalized seizures and has not been taking her AED that frequently says that her outpatient providers going to take off this medication.  However she did not have any postictal period.    Review of Systems:  ROS complete and negative except as marked above   No Known Allergies  Prior to Admission medications   Medication Sig Start Date End Date Taking? Authorizing Provider  levETIRAcetam (KEPPRA) 750 MG tablet Take 1 tablet (750 mg total) by mouth 2 (two) times daily. 06/13/23  Yes Sharee Holster, NP  levothyroxine (SYNTHROID) 75 MCG tablet Take 75 mcg by mouth daily before breakfast. 09/02/23  Yes [provider]  melatonin 5 MG TABS Take 5 mg by mouth at bedtime.   Yes [provider]  metoprolol succinate (TOPROL-XL) 50 MG 24 hr tablet Take 50 mg by mouth daily. 11/13/21  Yes [provider]  rosuvastatin (CRESTOR) 20 MG tablet Take 20 mg by mouth daily. 09/02/23  Yes [provider]   sennosides-docusate sodium (SENOKOT-S) 8.6-50 MG tablet Take 1 tablet by mouth 2 (two) times daily.   Yes [provider]  UNABLE TO FIND Diet: Regular diet. Send small portions.    [provider]    Past Medical History:  Diagnosis Date   Arthritis    Rheumatoid   Dysphagia    post CVA   Hypertension    Lupus (systemic lupus erythematosus) (HCC)    Seizure (HCC)    Stroke [facility])     Past Surgical History:  Procedure Laterality Date   BIOPSY  02/21/2022   Procedure: BIOPSY;  Surgeon: Lanelle Bal, DO;  [facility];  Service: Endoscopy;;   CESAREAN SECTION     COLONOSCOPY WITH PROPOFOL N/A 02/21/2022   Procedure: COLONOSCOPY WITH PROPOFOL;  Surgeon: Lanelle Bal, DO;  Location[facility];  Service: Endoscopy;  Laterality: N/A;  2:00pm   ESOPHAGEAL BRUSHING  02/21/2022   Procedure: ESOPHAGEAL BRUSHING;  Surgeon: Lanelle Bal, DO;  [facility];  Service: Endoscopy;;   ESOPHAGOGASTRODUODENOSCOPY (EGD) WITH PROPOFOL N/A 02/21/2022   Procedure: ESOPHAGOGASTRODUODENOSCOPY (EGD) WITH PROPOFOL;  Surgeon: Lanelle Bal, DO;  [facility];  Service: Endoscopy;  Laterality: N/A;   POLYPECTOMY  02/21/2022   Procedure: POLYPECTOMY;  Surgeon: Lanelle Bal, DO;  [facility];  Service: Endoscopy;;     reports that  she has never smoked. She has never used smokeless tobacco. She reports that she does not drink alcohol and does not use drugs.  Family History  Problem Relation Age of Onset   Diabetes Mother    Hypertension Mother    Cancer Mother    Stroke Mother    Colon cancer Father    Hypertension Sister    Hypertension Sister    Hypertension Sister    Hypertension Sister    Hypertension Sister    Hypertension Sister    Hypertension Brother    Hypertension Brother      Physical Exam: Vitals:   10/04/23 0200 10/04/23 0212 10/04/23 0232 10/04/23 0533  BP: 120/73  (!) 142/77 118/62  Pulse: 90  99  85  Resp: 19  19 20   Temp:  99.2 F (37.3 C) 100 F (37.8 C) 98.5 F (36.9 C)  TempSrc:  Oral Oral Oral  SpO2: 97%   98%  Weight:      Height:        Gen: Awake, alert, NAD   CV: Regular, normal S1, S2, no murmurs  Resp: Normal WOB, CTAB  Abd: Flat, normoactive, nontender MSK: Symmetric, no edema  Skin: No rashes or lesions to exposed skin  Neuro: Alert and interactive  Psych: euthymic, appropriate    Data review:   Labs reviewed, notable for:   Bicarb 20, no anion gap Creatinine 1.4 near baseline, other chemistries unremarkable Blood counts normal  Micro:  Results for orders placed or performed during the hospital encounter of 10/03/23  Resp panel by RT-PCR (RSV, Flu A&B, Covid) Anterior Nasal Swab     Status: None   Collection Time: 10/03/23  2:20 PM   Specimen: Anterior Nasal Swab  Result Value Ref Range Status   SARS Coronavirus 2 by RT PCR NEGATIVE NEGATIVE Final    Comment: (NOTE) SARS-CoV-2 target nucleic acids are NOT DETECTED.  The SARS-CoV-2 RNA is generally detectable in upper respiratory specimens during the acute phase of infection. The lowest concentration of SARS-CoV-2 viral copies this assay can detect is 138 copies/mL. A negative result does not preclude SARS-Cov-2 infection and should not be used as the sole basis for treatment or other patient management decisions. A negative result may occur with  improper specimen collection/handling, submission of specimen other than nasopharyngeal swab, presence of viral mutation(s) within the areas targeted by this assay, and inadequate number of viral copies(<138 copies/mL). A negative result must be combined with clinical observations, patient history, and epidemiological information. The expected result is Negative.  Fact Sheet for Patients:  BloggerCourse.com  Fact Sheet for Healthcare Providers:  SeriousBroker.it  This test is no t yet approved or  cleared by the [place] FDA and  has been authorized for detection and/or diagnosis of SARS-CoV-2 by FDA under an Emergency Use Authorization (EUA). This EUA will remain  in effect (meaning this test can be used) for the duration of the COVID-19 declaration under Section 564(b)(1) of the Act, 21 U.S.C.section 360bbb-3(b)(1), unless the authorization is terminated  or revoked sooner.       Influenza A by PCR NEGATIVE NEGATIVE Final   Influenza B by PCR NEGATIVE NEGATIVE Final    Comment: (NOTE) The Xpert Xpress SARS-CoV-2/FLU/RSV plus assay is intended as an aid in the diagnosis of influenza from Nasopharyngeal swab specimens and should not be used as a sole basis for treatment. Nasal washings and aspirates are unacceptable for Xpert Xpress SARS-CoV-2/FLU/RSV testing.  Fact Sheet for Patients: BloggerCourse.com  Fact  Sheet for Healthcare Providers: SeriousBroker.it  This test is not yet approved or cleared by the [place] and has been authorized for detection and/or diagnosis of SARS-CoV-2 by FDA under an Emergency Use Authorization (EUA). This EUA will remain in effect (meaning this test can be used) for the duration of the COVID-19 declaration under Section 564(b)(1) of the Act, 21 U.S.C. section 360bbb-3(b)(1), unless the authorization is terminated or revoked.     Resp Syncytial Virus by PCR NEGATIVE NEGATIVE Final    Comment: (NOTE) Fact Sheet for Patients: BloggerCourse.com  Fact Sheet for Healthcare Providers: SeriousBroker.it  This test is not yet approved or cleared by the [place] FDA and has been authorized for detection and/or diagnosis of SARS-CoV-2 by FDA under an Emergency Use Authorization (EUA). This EUA will remain in effect (meaning this test can be used) for the duration of the COVID-19 declaration under Section 564(b)(1) of the Act, 21  U.S.C. section 360bbb-3(b)(1), unless the authorization is terminated or revoked.  Performed at [facility], [address] [place], [place] [address]     Imaging reviewed:  CT Angio Chest PE W and/or Wo Contrast Result Date: 10/03/2023 CLINICAL DATA:  Evaluate for pulmonary embolus. Positive D-dimer. Loss of consciousness. EXAM: CT ANGIOGRAPHY CHEST WITH CONTRAST TECHNIQUE: Multidetector CT imaging of the chest was performed using the standard protocol during bolus administration of intravenous contrast. Multiplanar CT image reconstructions and MIPs were obtained to evaluate the vascular anatomy. RADIATION DOSE REDUCTION: This exam was performed according to the departmental dose-optimization program which includes automated exposure control, adjustment of the mA and/or kV according to patient size and/or use of iterative reconstruction technique. CONTRAST:  75mL OMNIPAQUE IOHEXOL 350 MG/ML SOLN COMPARISON:  01/21/2019 FINDINGS: Cardiovascular: Satisfactory opacification of the pulmonary arteries to the segmental level. No evidence of pulmonary embolism. Normal heart size. No pericardial effusion. Aortic atherosclerosis and coronary artery calcifications. Mediastinum/Nodes: Thyroid gland, trachea and esophagus demonstrate no significant abnormality. Prominent mediastinal lymph nodes are identified. The largest is in the subcarinal region measuring 1.3 cm. Lungs/Pleura: Small left pleural effusion identified, image 67/4. Overlying soft tissue nodule measures 1.5 x 0.9 cm, image 70/4. No airspace consolidation. Linear areas of subsegmental atelectasis versus scarring noted within the lower lung zones. Right upper lobe lung nodule measures 3 mm, image 52/6. This is unchanged when compared with the previous exam and is compatible with a benign abnormality. Upper Abdomen: No acute abnormality. Simple appearing left kidney cyst measures 2.9 cm. No follow-up imaging recommended. Musculoskeletal: No chest wall  abnormality. No acute or significant osseous findings. Review of the MIP images confirms the above findings. IMPRESSION: 1. No evidence for acute pulmonary embolus. 2. Small left pleural effusion. Overlying soft tissue nodule measures 1.5 x 0.9 cm. Etiology is indeterminate. Consider one of the following in 3 months for both low-risk and high-risk individuals: (a) repeat chest CT, (b) follow-up PET-CT, or (c) tissue sampling. This recommendation follows the consensus statement: Guidelines for Management of Incidental Pulmonary Nodules Detected on CT Images: From the Fleischner Society 2017; Radiology 2017; 284:228-243. 3. Coronary artery calcifications. 4.  Aortic Atherosclerosis (ICD10-I70.0). Electronically Signed   By: Signa Kell M.D.   On: 10/03/2023 19:45   DG Chest Portable 1 View Result Date: 10/03/2023 CLINICAL DATA:  Weakness, loss of consciousness. EXAM: PORTABLE CHEST 1 VIEW COMPARISON:  Jan 20, 2019. FINDINGS: The heart size and mediastinal contours are within normal limits. Elevated right hemidiaphragm is noted. Both lungs are clear. The visualized skeletal structures are  unremarkable. IMPRESSION: No active disease. Electronically Signed   By: Lupita Raider M.D.   On: 10/03/2023 15:24   CT Head Wo Contrast Result Date: 10/03/2023 CLINICAL DATA:  Syncope/presyncope. Cerebrovascular cause suspected. EXAM: CT HEAD WITHOUT CONTRAST TECHNIQUE: Contiguous axial images were obtained from the base of the skull through the vertex without intravenous contrast. RADIATION DOSE REDUCTION: This exam was performed according to the departmental dose-optimization program which includes automated exposure control, adjustment of the mA and/or kV according to patient size and/or use of iterative reconstruction technique. COMPARISON:  04/09/2022 FINDINGS: Brain: No focal abnormality affects the brainstem. Old cerebellar infarctions on the left. Ordinary chronic small-vessel ischemic change of the cerebral  hemispheric white matter. No sign of acute infarction, mass lesion, hemorrhage, hydrocephalus or extra-axial collection. Vascular: There is atherosclerotic calcification of the major vessels at the base of the brain. Skull: Negative Sinuses/Orbits: Clear/normal Other: None IMPRESSION: No acute CT finding. 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 Ordinary chronic small-vessel ischemic change of the cerebral hemispheric white matter. Electronically Signed   By: Paulina Fusi M.D.   On: 10/03/2023 15:11    EKG: Sinus tachycardia with LVH, inferior Q waves, disagree with computer read of QT on my eval appears to be 340 with Qtc 450   ED Course:  Treated with 1 L IV fluid   Assessment/Plan:  68 y.o. female , ? LTC resident, with hx hypertension, SLE, seizure disorder, CVA, CKD 3, hypothyroidism who was brought in by EMS after syncopal episode that occurred while at a restaurant.    Syncope Suspect vagal event with prodrome.  Possibly brought on with recent infection, reports symptoms similar to viral syndrome with LRI, GI symptoms.  Also reporting frequency and UA appears consistent with UTI.  Cardiac seems less likely but does have abnormal findings on EKG with inferior Q waves and structural changes. - Telemonitoring - Orthostatics - Since cardiac seems less likely hold off on echo for now - Treatment of possible UTI per below -Status post 1 L IV fluid in the ED, continue oral hydration  Urinary tract infection, uncomplicated Initial UA contaminated, repeat with findings suspicious for UTI - Start ceftriaxone, follow-up urine culture  Viral syndrome Reports recent cough, diarrhea x 1 day.  Flu/COVID/RSV negative. - Check RVP  Positive D-dimer, no PE Incidentally found to have a positive D-dimer evaluated with CTA chest for PE which was negative. - Suspect incidental possibly high in the setting of underlying infection - For now have not ordered for duplex of the lower extremities has no signs of  underlying DVT  Deconditioning - PT evaluation  Chronic medical problems: Hypertension: Continue home metoprolol SLE: Not on disease modifying treatment Seizure disorder: Continue her home Keppra, notes she was not taking as prescribed and reports her neurologist was planning on taking her off of this medication soon. CVA: Continue home statin, aspirin not on med list. CKD 3: Creatinine near baseline at 1.4 Hypothyroidism: Continue home levothyroxine.  Check TSH  Body mass index is 25.75 kg/m.    DVT prophylaxis:  Lovenox Code Status:  Full Code Diet:  Diet Orders (From admission, onward)     Start     Ordered   10/04/23 0042  Diet regular Room service appropriate? Yes; Fluid consistency: Thin  Diet effective now       Question Answer Comment  Room service appropriate? Yes   Fluid consistency: Thin      10/04/23 0046           Family  Communication: No Consults: None Admission status:   Observation, Telemetry bed  Severity of Illness: The appropriate patient status for this patient is OBSERVATION. Observation status is judged to be reasonable and necessary in order to provide the required intensity of service to ensure the patient's safety. The patient's presenting symptoms, physical exam findings, and initial radiographic and laboratory data in the context of their medical condition is felt to place them at decreased risk for further clinical deterioration. Furthermore, it is anticipated that the patient will be medically stable for discharge from the hospital within 2 midnights of admission.    Dolly Rias, MD [facility]  How to contact the [facility] Attending or Consulting provider 7A - 7P or covering provider during after hours 7P -7A, for this patient.  Check the care team in [facility] and look for a) attending/consulting [facility] provider listed and b) the [facility] team listed Log into [email or web address] and use [place]'s universal password to access. If you do not have the  password, please contact the hospital operator. Locate the [facility] provider you are looking for under [facility] and page to a number that you can be directly reached. If you still have difficulty reaching the provider, please page the [facility] (Director on Call) for the Hospitalists listed on amion for assistance.  10/04/2023, 6:34 AM

## 2023-10-04 NOTE — Plan of Care (Signed)

## 2023-10-04 NOTE — Evaluation (Signed)
Physical Therapy Evaluation Patient Details Name: Rebekah [name] MRN: [id number] DOB: 15-Feb-1956 Today's Date: 10/04/2023  History of Present Illness  Rebekah [name] is a 68 y.o. female with hx of hypertension, SLE, seizure disorder, CVA, who was brought in by EMS after syncopal episode that occurred while at a restaurant.  Reports in the past few weeks she has been feeling overall weak.  Gets around with the use of a rollator.  Recently also having cough at bedtime, urinary frequency.  Also had an episode of diarrhea today.  She was out at [employer] and began to feel nauseous and overall unwell.  She does not recall what happened next but apparently she had a syncopal episode slumped over.  EMS who were eating at the restaurant to assist her, advised to go to the ED.  No history of recent chest pain, palpitations during the event.  She does have remote history of generalized seizures and has not been taking her AED that frequently says that her outpatient providers going to take off this medication.  However she did not have any postictal period.   Clinical Impression  Patient functioning at baseline for functional mobility and gait demonstrating good return for ambulating in room, hallway using her Rollator without loss of balance.  Plan:  Patient discharged from physical therapy to care of nursing for ambulation daily as tolerated for length of stay.          If plan is discharge home, recommend the following: Help with stairs or ramp for entrance   Can travel by private vehicle        Equipment Recommendations    Recommendations for Other Services       Functional Status Assessment Patient has not had a recent decline in their functional status     Precautions / Restrictions Precautions Precautions: None Restrictions Weight Bearing Restrictions Per Provider Order: No      Mobility  Bed Mobility Overal bed mobility: Modified Independent                   Transfers Overall transfer level: Modified independent                      Ambulation/Gait Ambulation/Gait assistance: Modified independent (Device/Increase time) Gait Distance (Feet): 100 Feet Assistive device: Rollator (4 wheels) Gait Pattern/deviations: Decreased step length - right, Decreased step length - left, Decreased stride length Gait velocity: slightly decreased     General Gait Details: good return for ambulating in room, hallways using her Rollator without loss of balance  Stairs            Wheelchair Mobility     Tilt Bed    Modified Rankin (Stroke Patients Only)       Balance Overall balance assessment: Needs assistance Sitting-balance support: Feet supported, No upper extremity supported Sitting balance-Leahy Scale: Good Sitting balance - Comments: seated at EOB   Standing balance support: No upper extremity supported Standing balance-Leahy Scale: Fair Standing balance comment: fair/good using RW                             Pertinent Vitals/Pain Pain Assessment Pain Assessment: No/denies pain    Home Living Family/patient expects to be discharged to:: Private residence Living Arrangements: Children Available Help at Discharge: Family;Available 24 hours/day Type of Home: House Home Access: Stairs to enter Entrance Stairs-Rails: None Entrance Stairs-Number of Steps: 3 Alternate Level Stairs-Number of  Steps: Patient does not go upstairs Home Layout: Two level;Able to live on main level with bedroom/bathroom;Full bath on main level Home Equipment: Rollator (4 wheels);Wheelchair - manual;Grab bars - tub/shower      Prior Function Prior Level of Function : Independent/Modified Independent             Mobility Comments: household and short distanced community ambulation using Rollator ADLs Comments: Independent for household, assisted for community     Extremity/Trunk Assessment   Upper Extremity  Assessment Upper Extremity Assessment: Overall WFL for tasks assessed    Lower Extremity Assessment Lower Extremity Assessment: Overall WFL for tasks assessed    Cervical / Trunk Assessment Cervical / Trunk Assessment: Kyphotic  Communication   Communication Communication: No apparent difficulties  Cognition Arousal: Alert Behavior During Therapy: WFL for tasks assessed/performed Overall Cognitive Status: Within Functional Limits for tasks assessed                                          General Comments      Exercises     Assessment/Plan    PT Assessment Patient does not need any further PT services  PT Problem List         PT Treatment Interventions      PT Goals (Current goals can be found in the Care Plan section)  Acute Rehab PT Goals Patient Stated Goal: return home with family to assist PT Goal Formulation: With patient Time For Goal Achievement: 10/04/23 Potential to Achieve Goals: Good    Frequency       Co-evaluation               AM-PAC PT "6 Clicks" Mobility  Outcome Measure Help needed turning from your back to your side while in a flat bed without using bedrails?: None Help needed moving from lying on your back to sitting on the side of a flat bed without using bedrails?: None Help needed moving to and from a bed to a chair (including a wheelchair)?: None Help needed standing up from a chair using your arms (e.g., wheelchair or bedside chair)?: None Help needed to walk in hospital room?: None Help needed climbing 3-5 steps with a railing? : A Little 6 Click Score: 23    End of Session   Activity Tolerance: Patient tolerated treatment well Patient left: in chair;with call bell/phone within reach Nurse Communication: Mobility status PT Visit Diagnosis: Unsteadiness on feet (R26.81);Other abnormalities of gait and mobility (R26.89);Muscle weakness (generalized) (M62.81)    Time: 8413-2440 PT Time Calculation (min)  (ACUTE ONLY): 22 min   Charges:   PT Evaluation $PT Eval Moderate Complexity: 1 Mod PT Treatments $Therapeutic Activity: 8-22 mins PT General Charges $$ ACUTE PT VISIT: 1 Visit         12:44 PM, 10/04/23 Ocie Bob, MPT Physical Therapist with [facility] [phone] office [phone] mobile phone

## 2023-10-04 NOTE — TOC CM/SW Note (Signed)
Transition of Care [facility]) - Inpatient Brief Assessment   Patient Details  Name: Rebekah [name] MRN: [id number] Date of Birth: 1956/04/03  Transition of Care [facility]) CM/SW Contact:    Villa Herb, LCSWA Phone Number: 10/04/2023, 2:44 PM   Clinical Narrative: Transition of Care Department [facility]) has reviewed patient and no TOC needs have been identified at this time. We will continue to monitor patient advancement through interdisciplinary progression rounds. If new patient transition needs arise, please place a TOC consult.  Transition of Care Asessment: Insurance and Status: Insurance coverage has been reviewed Patient has primary care physician: Yes Home environment has been reviewed: From home Prior level of function:: Independent Prior/Current Home Services: No current home services Social Drivers of Health Review: SDOH reviewed no interventions necessary Readmission risk has been reviewed: Yes Transition of care needs: no transition of care needs at this time

## 2023-10-04 NOTE — Progress Notes (Addendum)
[facility] PROGRESS NOTE  Rebekah [name] (DOB: 06/23/1956) HYQ:[id number] PCP: Benita Stabile, MD  Brief Narrative: Rebekah [name] is a 68 y.o. female with hx of hypertension, SLE, seizure disorder, CVA, who was brought in by EMS after syncopal episode that occurred while at a restaurant.  She was admitted this morning by Dr. Lazarus Salines for syncope work up and monitoring.   Subjective: Still feels weaker than she usually does but no orthostatic dizziness. No leg swelling, chest pain or dyspnea.   Objective: BP (!) 111/58 (BP Location: Left Arm)   Pulse 85   Temp 98.2 F (36.8 C) (Oral)   Resp 20   Ht 5\' 4"  (1.626 m)   Wt 68 kg   SpO2 100%   BMI 25.75 kg/m   Gen: No acute distress Pulm: Clear, nonlabored  CV: RRR, no MRG or edema GI: Soft, NT, ND, +BS  Neuro: Alert and oriented. Diffuse weakness without focal deficits. Ext: Warm, no deformities. Skin: No rashes, lesions or ulcers on visualized skin   Assessment & Plan: Syncope: Possibly vasovagal with evidence of UTI/infection. Flu/COVID/RSV negative, though RVP still pending.  - Continue cardiac monitoring. NSR thus far (telemetry reviewed this afternoon).  - PT eval, check orthostatics.  - Tx UTI as below - Given severity of episode, patient is reluctant to return to home environment at this time. We will monitor overnight and plan DC if stable 1/19 early AM (was admitted earlier this morning)  UTI:  - Continue ceftriaxone pending culture data   Positive D-dimer: No PE on CTA, no LE DVT on venous U/S today.  Hypothyroidism: TSH wnl at 1.948.  - Continue synthroid  HTN:  - Continue metoprolol  SLE: Quiescent. 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 Not on regular Tx.   Seizure disorder: No postictal period noted here.  - Continue keppra, defer ongoing management to outpatient neurologist.   History of CVA: CT head nonacute.   - Continue home statin, aspirin not on med list.  CKD 3: Creatinine near baseline at 1.4   Pulmonary nodule, incidental, 1.5 x 0.9cm, indeterminate:  - Discussed the need for repeat chest CT in 3 months.     Tyrone Nine, MD [facility] [email or web address] 10/04/2023, 1:53 PM

## 2023-10-04 NOTE — ED Notes (Signed)
ED TO INPATIENT HANDOFF REPORT  ED Nurse Name and Phone #: Brnadon Eoff D RN  S Name/Age/Gender Rebekah [name] 68 y.o. female Room/Bed: APA09/APA09  Code Status   Code Status: Full Code  Home/SNF/Other Home Patient oriented to: self, place, time, and situation Is this baseline? Yes   Triage Complete: Triage complete  Chief Complaint Syncope and collapse [R55]  Triage Note Patient BIB [facility] from [place], Patient lost consciousness and was assisted to floor was unresponsive, sternum rub done by fire department and patient woke up. Hx of stroke, HTN and seizures. Patient stated she got a sudden lose of appetite and weakness prior to losing consciousness. Patient uses a walker.    Allergies No Known Allergies  Level of Care/Admitting Diagnosis ED Disposition     ED Disposition  Admit   Condition  --   Comment  Hospital Area: [facility] [[id number]]  Level of Care: Telemetry [5]  Covid Evaluation: Asymptomatic - no recent exposure (last 10 days) testing not required  Diagnosis: Syncope and collapse [780.2.ICD-9-CM]  Admitting Physician: Dolly Rias [[id number]]  Attending Physician: Dolly Rias [[id number]]          B Medical/Surgery History Past Medical History:  Diagnosis Date   Arthritis    Rheumatoid   Dysphagia    post CVA   Hypertension    Lupus (systemic lupus erythematosus) (HCC)    Seizure (HCC)    Stroke [facility])    Past Surgical History:  Procedure Laterality Date   BIOPSY  02/21/2022   Procedure: BIOPSY;  Surgeon: Lanelle Bal, DO;  [facility];  Service: Endoscopy;;   CESAREAN SECTION     COLONOSCOPY WITH PROPOFOL N/A 02/21/2022   Procedure: COLONOSCOPY WITH PROPOFOL;  Surgeon: Lanelle Bal, DO;  [facility];  Service: Endoscopy;  Laterality: N/A;  2:00pm   ESOPHAGEAL BRUSHING  02/21/2022   Procedure: ESOPHAGEAL BRUSHING;  Surgeon: Lanelle Bal, DO;  [facility];  Service: Endoscopy;;    ESOPHAGOGASTRODUODENOSCOPY (EGD) WITH PROPOFOL N/A 02/21/2022   Procedure: ESOPHAGOGASTRODUODENOSCOPY (EGD) WITH PROPOFOL;  Surgeon: Lanelle Bal, DO;  [facility];  Service: Endoscopy;  Laterality: N/A;   POLYPECTOMY  02/21/2022   Procedure: POLYPECTOMY;  Surgeon: Lanelle Bal, DO;  [facility];  Service: Endoscopy;;     A IV Location/Drains/Wounds Patient Lines/Drains/Airways Status     Active Line/Drains/Airways     Name Placement date Placement time Site Days   Peripheral IV 10/03/23 20 G 1" Anterior;Right Forearm 10/03/23  1412  Forearm  1            Intake/Output Last 24 hours No intake or output data in the 24 hours ending 10/04/23 0046  Labs/Imaging Results for orders placed or performed during the hospital encounter of 10/03/23 (from the past 48 hours)  Urinalysis, Routine w reflex microscopic -Urine, Clean Catch     Status: Abnormal   Collection Time: 10/03/23  1:59 PM  Result Value Ref Range   Color, Urine YELLOW YELLOW   APPearance HAZY (A) CLEAR   Specific Gravity, Urine 1.011 1.005 - 1.030   pH 6.0 5.0 - 8.0   Glucose, UA NEGATIVE NEGATIVE mg/dL   Hgb urine dipstick NEGATIVE NEGATIVE   Bilirubin Urine NEGATIVE NEGATIVE   Ketones, ur NEGATIVE NEGATIVE mg/dL   Protein, ur 30 (A) NEGATIVE mg/dL   Nitrite POSITIVE (A) NEGATIVE   Leukocytes,Ua TRACE (A) NEGATIVE   RBC / HPF 0-5 0 - 5 RBC/hpf  WBC, UA 6-10 0 - 5 WBC/hpf   Bacteria, UA MANY (A) NONE SEEN   Squamous Epithelial / HPF 11-20 0 - 5 /HPF   Mucus PRESENT     Comment: Performed at [facility], [address], [place], [place] [address]  CBC with Differential     Status: Abnormal   Collection Time: 10/03/23  2:09 PM  Result Value Ref Range   WBC 4.8 4.0 - 10.5 K/uL   RBC 3.88 3.87 - 5.11 MIL/uL   Hemoglobin 11.7 (L) 12.0 - 15.0 g/dL   HCT 62.1 30.8 - 65.7 %   MCV 95.1 80.0 - 100.0 fL   MCH 30.2 26.0 - 34.0 pg   MCHC 31.7 30.0 - 36.0 g/dL   RDW 84.6 96.2 - 95.2 %    Platelets 376 150 - 400 K/uL   nRBC 0.0 0.0 - 0.2 %   Neutrophils Relative % 84 %   Neutro Abs 4.0 1.7 - 7.7 K/uL   Lymphocytes Relative 12 %   Lymphs Abs 0.6 (L) 0.7 - 4.0 K/uL   Monocytes Relative 3 %   Monocytes Absolute 0.1 0.1 - 1.0 K/uL   Eosinophils Relative 0 %   Eosinophils Absolute 0.0 0.0 - 0.5 K/uL   Basophils Relative 0 %   Basophils Absolute 0.0 0.0 - 0.1 K/uL   Immature Granulocytes 1 %   Abs Immature Granulocytes 0.04 0.00 - 0.07 K/uL    Comment: Performed at [facility], [address] [place], [place] [address]  Comprehensive metabolic panel     Status: Abnormal   Collection Time: 10/03/23  2:09 PM  Result Value Ref Range   Sodium 133 (L) 135 - 145 mmol/L   Potassium 3.7 3.5 - 5.1 mmol/L   Chloride 105 98 - 111 mmol/L   CO2 20 (L) 22 - 32 mmol/L   Glucose, Bld 121 (H) 70 - 99 mg/dL    Comment: Glucose reference range applies only to samples taken after fasting for at least 8 hours.   BUN 21 8 - 23 mg/dL   Creatinine, Ser 4.40 (H) 0.44 - 1.00 mg/dL   Calcium 8.7 (L) 8.9 - 10.3 mg/dL   Total Protein 7.8 6.5 - 8.1 g/dL   Albumin 3.1 (L) 3.5 - 5.0 g/dL   AST 26 15 - 41 U/L   ALT 18 0 - 44 U/L   Alkaline Phosphatase 55 38 - 126 U/L   Total Bilirubin 0.5 0.0 - 1.2 mg/dL   GFR, Estimated 40 (L) >60 mL/min    Comment: (NOTE) Calculated using the CKD-EPI Creatinine Equation (2021)    Anion gap 8 5 - 15    Comment: Performed at [facility], [address], [place], [place] [address]  Troponin I (High Sensitivity)     Status: None   Collection Time: 10/03/23  2:09 PM  Result Value Ref Range   Troponin I (High Sensitivity) 7 <18 ng/L    Comment: (NOTE) Elevated high sensitivity troponin I (hsTnI) values and significant  changes across serial measurements may suggest ACS but many other  chronic and acute conditions are known to elevate hsTnI results.  Refer to the "Links" section for chest pain algorithms and additional  guidance. Performed at [facility], [address], [place], [place] [address]   D-dimer, quantitative     Status: Abnormal   Collection Time: 10/03/23  2:09 PM  Result Value Ref Range   D-Dimer, Quant 5.22 (H) 0.00 - 0.50 ug/mL-FEU    Comment: (  NOTE) At the manufacturer cut-off value of 0.5 g/mL FEU, this assay has a negative predictive value of 95-100%.This assay is intended for use in conjunction with a clinical pretest probability (PTP) assessment model to exclude pulmonary embolism (PE) and deep venous thrombosis (DVT) in outpatients suspected of PE or DVT. Results should be correlated with clinical presentation. Performed at [facility], [address], [place], [place] [address]   Resp panel by RT-PCR (RSV, Flu A&B, Covid) Anterior Nasal Swab     Status: None   Collection Time: 10/03/23  2:20 PM   Specimen: Anterior Nasal Swab  Result Value Ref Range   SARS Coronavirus 2 by RT PCR NEGATIVE NEGATIVE    Comment: (NOTE) SARS-CoV-2 target nucleic acids are NOT DETECTED.  The SARS-CoV-2 RNA is generally detectable in upper respiratory specimens during the acute phase of infection. The lowest concentration of SARS-CoV-2 viral copies this assay can detect is 138 copies/mL. A negative result does not preclude SARS-Cov-2 infection and should not be used as the sole basis for treatment or other patient management decisions. A negative result may occur with  improper specimen collection/handling, submission of specimen other than nasopharyngeal swab, presence of viral mutation(s) within the areas targeted by this assay, and inadequate number of viral copies(<138 copies/mL). A negative result must be combined with clinical observations, patient history, and epidemiological information. The expected result is Negative.  Fact Sheet for Patients:  BloggerCourse.com  Fact Sheet for Healthcare Providers:  SeriousBroker.it  This test is no t yet approved or cleared by  the [place] FDA and  has been authorized for detection and/or diagnosis of SARS-CoV-2 by FDA under an Emergency Use Authorization (EUA). This EUA will remain  in effect (meaning this test can be used) for the duration of the COVID-19 declaration under Section 564(b)(1) of the Act, 21 U.S.C.section 360bbb-3(b)(1), unless the authorization is terminated  or revoked sooner.       Influenza A by PCR NEGATIVE NEGATIVE   Influenza B by PCR NEGATIVE NEGATIVE    Comment: (NOTE) The Xpert Xpress SARS-CoV-2/FLU/RSV plus assay is intended as an aid in the diagnosis of influenza from Nasopharyngeal swab specimens and should not be used as a sole basis for treatment. Nasal washings and aspirates are unacceptable for Xpert Xpress SARS-CoV-2/FLU/RSV testing.  Fact Sheet for Patients: BloggerCourse.com  Fact Sheet for Healthcare Providers: SeriousBroker.it  This test is not yet approved or cleared by the [place] FDA and has been authorized for detection and/or diagnosis of SARS-CoV-2 by FDA under an Emergency Use Authorization (EUA). This EUA will remain in effect (meaning this test can be used) for the duration of the COVID-19 declaration under Section 564(b)(1) of the Act, 21 U.S.C. section 360bbb-3(b)(1), unless the authorization is terminated or revoked.     Resp Syncytial Virus by PCR NEGATIVE NEGATIVE    Comment: (NOTE) Fact Sheet for Patients: BloggerCourse.com  Fact Sheet for Healthcare Providers: SeriousBroker.it  This test is not yet approved or cleared by the [place] FDA and has been authorized for detection and/or diagnosis of SARS-CoV-2 by FDA under an Emergency Use Authorization (EUA). This EUA will remain in effect (meaning this test can be used) for the duration of the COVID-19 declaration under Section 564(b)(1) of the Act, 21 U.S.C. section  360bbb-3(b)(1), unless the authorization is terminated or revoked.  Performed at [facility], [address] [place], [place] [address]   Troponin I (High Sensitivity)     Status: None   Collection Time: 10/03/23  3:58 PM  Result Value Ref Range   Troponin I (High Sensitivity) 8 <18 ng/L    Comment: (NOTE) Elevated high sensitivity troponin I (hsTnI) values and significant  changes across serial measurements may suggest ACS but many other  chronic and acute conditions are known to elevate hsTnI results.  Refer to the "Links" section for chest pain algorithms and additional  guidance. Performed at [facility], [address], [place], [place] [address]    DG Chest Portable 1 View Result Date: 10/03/2023 CLINICAL DATA:  Weakness, loss of consciousness. EXAM: PORTABLE CHEST 1 VIEW COMPARISON:  Jan 20, 2019. FINDINGS: The heart size and mediastinal contours are within normal limits. Elevated right hemidiaphragm is noted. Both lungs are clear. The visualized skeletal structures are unremarkable. IMPRESSION: No active disease. Electronically Signed   By: Lupita Raider M.D.   On: 10/03/2023 15:24   CT Head Wo Contrast Result Date: 10/03/2023 CLINICAL DATA:  Syncope/presyncope. Cerebrovascular cause suspected. EXAM: CT HEAD WITHOUT CONTRAST TECHNIQUE: Contiguous axial images were obtained from the base of the skull through the vertex without intravenous contrast. RADIATION DOSE REDUCTION: This exam was performed according to the departmental dose-optimization program which includes automated exposure control, adjustment of the mA and/or kV according to patient size and/or use of iterative reconstruction technique. COMPARISON:  04/09/2022 FINDINGS: Brain: No focal abnormality affects the brainstem. Old cerebellar infarctions on the left. Ordinary chronic small-vessel ischemic change of the cerebral hemispheric white matter. No sign of acute infarction, mass lesion, hemorrhage, hydrocephalus or  extra-axial collection. Vascular: There is atherosclerotic calcification of the major vessels at the base of the brain. Skull: Negative Sinuses/Orbits: Clear/normal Other: None IMPRESSION: No acute CT finding. 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 Ordinary chronic small-vessel ischemic change of the cerebral hemispheric white matter. Electronically Signed   By: Paulina Fusi M.D.   On: 10/03/2023 15:11    Pending Labs Unresulted Labs (From admission, onward)     Start     Ordered   10/04/23 0500  TSH  Tomorrow morning,   R        10/04/23 0043   10/04/23 0500  HIV Antibody (routine testing w rflx)  (HIV Antibody (Routine testing w reflex) panel)  Tomorrow morning,   R        10/04/23 0046   10/04/23 0500  Basic metabolic panel  Tomorrow morning,   R        10/04/23 0046   10/04/23 0500  Magnesium  Tomorrow morning,   R        10/04/23 0046   10/04/23 0500  Phosphorus  Tomorrow morning,   R        10/04/23 0046   10/04/23 0040  Respiratory (~20 pathogens) panel by PCR  (Respiratory panel by PCR (~20 pathogens, ~24 hr TAT)  w precautions)  Once,   R        10/04/23 0039   10/03/23 2149  Urinalysis, Routine w reflex microscopic -Urine, Clean Catch  Once,   URGENT       Question Answer Comment  Specimen Source Urine, Clean Catch   Release to patient Immediate      10/03/23 2148   10/03/23 2136  Urine Culture  Once,   URGENT       Question Answer Comment  Indication Urgency/frequency   Patient immune status Normal   Release to patient Immediate      10/03/23 2135            Vitals/Pain Today's Vitals  10/03/23 2100 10/03/23 2115 10/03/23 2245 10/03/23 2300  BP: 138/72 136/74 129/73 (!) 125/59  Pulse: 94 92 92 94  Resp: (!) 24 (!) 22 (!) 24 (!) 29  Temp:      TempSrc:      SpO2: 99% 97% 96% 97%  Weight:      Height:      PainSc:        Isolation Precautions Droplet precaution  Medications Medications  levETIRAcetam (KEPPRA) tablet 750 mg (has no administration in time  range)  levothyroxine (SYNTHROID) tablet 75 mcg (has no administration in time range)  melatonin tablet 5 mg (has no administration in time range)  metoprolol succinate (TOPROL-XL) 24 hr tablet 50 mg (has no administration in time range)  rosuvastatin (CRESTOR) tablet 20 mg (has no administration in time range)  enoxaparin (LOVENOX) injection 40 mg (has no administration in time range)  sodium chloride flush (NS) 0.9 % injection 3 mL (has no administration in time range)  lactated ringers bolus 500 mL (has no administration in time range)  acetaminophen (TYLENOL) tablet 1,000 mg (has no administration in time range)  melatonin tablet 6 mg (has no administration in time range)  albuterol (PROVENTIL) (2.5 MG/3ML) 0.083% nebulizer solution 2.5 mg (has no administration in time range)  sodium chloride 0.9 % bolus 1,000 mL (1,000 mLs Intravenous Bolus 10/03/23 1418)  iohexol (OMNIPAQUE) 350 MG/ML injection 75 mL (75 mLs Intravenous Contrast Given 10/03/23 1929)    Mobility walks with device     Focused Assessments Cardiac Assessment Handoff:    Lab Results  Component Value Date   CKTOTAL 37 (L) 02/17/2023   Lab Results  Component Value Date   DDIMER 5.22 (H) 10/03/2023   Does the Patient currently have chest pain? No    R Recommendations: See Admitting Provider Note  Report given to:   Additional Notes: .

## 2023-10-04 NOTE — Care Management Obs Status (Signed)
MEDICARE OBSERVATION STATUS NOTIFICATION   Patient Details  Name: Rebekah [name] MRN: [id number] Date of Birth: [birth year]   Medicare Observation Status Notification Given:  Yes  I Johny Shock., LCSWA verbally reviewed observation notice, signature provided by Ramond Craver.   Villa Herb, LCSWA 10/04/2023, 12:19 PM

## 2023-10-04 NOTE — ED Notes (Signed)
Report given to admission RN.  All questions answered

## 2023-10-05 DIAGNOSIS — R55 Syncope and collapse: Secondary | ICD-10-CM | POA: Diagnosis not present

## 2023-10-05 MED ORDER — CEPHALEXIN 500 MG PO CAPS: 500.0000 mg | ORAL_CAPSULE | Freq: Two times a day (BID) | ORAL | 0 refills | Status: AC

## 2023-10-05 NOTE — Plan of Care (Signed)
  Problem: Clinical Measurements: Goal: Respiratory complications will improve Outcome: Not Progressing   Problem: Clinical Measurements: Goal: Cardiovascular complication will be avoided Outcome: Not Progressing   Problem: Clinical Measurements: Goal: Will remain free from infection Outcome: Not Progressing

## 2023-10-05 NOTE — Discharge Summary (Signed)
Physician Discharge Summary   Patient: Rebekah [name] MRN: [id number] DOB: [birth year]  Admit date:     10/03/2023  Discharge date: 10/05/23  Discharge Physician: Tyrone Nine   PCP: Benita Stabile, MD   Recommendations at discharge:  Follow up with PCP in 1-2 weeks. If syncopal symptom recurs, consider cardiac monitoring. We're completing treatment for UTI, a suspected provocative factor. Repeat chest CT in 3 months for surveillance of soft tissue nodule, small left pleural effusion seen on CTA chest 10/03/2023. Patient aware of this recommendation.  Discharge Diagnoses: Principal Problem:   Syncope and collapse  Hospital Course: Rebekah [name] is a 68 y.o. female with hx of hypertension, SLE, seizure disorder, CVA, who was brought in by EMS after syncopal episode that occurred while at a restaurant.  She was admitted for syncope work up and monitoring. Ceftriaxone started for UTI with subjective improvement in reported feeling by patient. No further syncopal episodes. She's ambulating without assistance at her baseline. She did have an isolated fever but feels well and is amenable to discharge on 1/19 with return precautions discussed.   Assessment and Plan: Syncope: Possibly vasovagal with evidence of UTI/infection. Flu/COVID/RSV and full RVP negative.  - Complete Tx of UTI as below - Consider cardiac monitoring if symptom recurs. No PT follow up required. No orthostatic symptoms.     UTI:  - Feels better after initiating ceftriaxone, urine culture still pending. She's maximized benefit of hospitalization and tolerates oral medications, so will transition to keflex empirically to complete a 7 day course pending urine culture (no results at time of discharge)   Fever: No new nidus for infection. In fact, she's feeling better/stronger. Still no appetite but eating just fine. Her only symptom of late is increasing urinary frequency, so we will continue treating UTI as above. Ruled out  for VTE. Alternative etiology may be the pulmonary abnormality as noted below. No evidence of sepsis.  Positive D-dimer: No PE on CTA, no LE DVT on venous U/S today.   Hypothyroidism: TSH wnl at 1.948.  - Continue synthroid   HTN:  - Continue metoprolol   SLE: Quiescent. 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 Not on regular Tx.    Seizure disorder: No postictal period noted here.  - Continue keppra, defer ongoing management to outpatient neurologist.    History of CVA: CT head nonacute.  - Continue home statin, aspirin not on med list.   CKD 3: Creatinine near baseline at 1.4    Pulmonary nodule, incidental, 1.5 x 0.9cm, indeterminate:  - Discussed the need for repeat chest CT in 3 months.    Consultants: None Procedures performed: None  Disposition: Home Diet recommendation: Regular DISCHARGE MEDICATION: Allergies as of 10/05/2023   No Known Allergies      Medication List     TAKE these medications    cephALEXin 500 MG capsule Commonly known as: Keflex Take 1 capsule (500 mg total) by mouth 2 (two) times daily for 6 days.   levETIRAcetam 750 MG tablet Commonly known as: Keppra Take 1 tablet (750 mg total) by mouth 2 (two) times daily.   levothyroxine 75 MCG tablet Commonly known as: SYNTHROID Take 75 mcg by mouth daily before breakfast.   melatonin 5 MG Tabs Take 5 mg by mouth at bedtime.   metoprolol succinate 50 MG 24 hr tablet Commonly known as: TOPROL-XL Take 50  mg by mouth daily.   rosuvastatin 20 MG tablet Commonly known as: CRESTOR Take 20 mg by mouth daily.   sennosides-docusate sodium 8.6-50 MG tablet Commonly known as: SENOKOT-S Take 1 tablet by mouth 2 (two) times daily.   UNABLE TO FIND Diet: Regular diet. Send small portions.        Follow-up Information     Benita Stabile, MD Follow up.   Specialty: Internal Medicine Contact information: [address] [place] [address] [phone]                Discharge Exam: Filed Weights   10/03/23 1351  Weight: 68 kg  BP 123/73 (BP Location: Right Arm)   Pulse 80   Temp 97.8 F (36.6 C) (Oral)   Resp 19   Ht 5\' 4"  (1.626 m)   Wt 68 kg   SpO2 100%   BMI 25.75 kg/m   Well-appearing 67yo F in no distress Clear, nonlabored RRR, no MRG Soft, NT, ND, +BS No LE edema Alert, oriented, no focal deficits No open wounds on visualized skin.  Condition at discharge: stable  The results of significant diagnostics from this hospitalization (including imaging, microbiology, ancillary and laboratory) are listed below for reference.   Imaging Studies: US Venous Img Lower Bilateral (DVT) Result Date: 10/04/2023 CLINICAL DATA:  Bilateral lower extremity edema for the past 2 days. Evaluate for DVT. EXAM: BILATERAL LOWER EXTREMITY VENOUS DOPPLER ULTRASOUND TECHNIQUE: Gray-scale sonography with graded compression, as well as color Doppler and duplex ultrasound were performed to evaluate the lower extremity deep venous systems from the level of the common femoral vein and including the common femoral, femoral, profunda femoral, popliteal and calf veins including the posterior tibial, peroneal and gastrocnemius veins when visible. The superficial great saphenous vein was also interrogated. Spectral Doppler was utilized to evaluate flow at rest and with distal augmentation maneuvers in the common femoral, femoral and popliteal veins. COMPARISON:  None Available. FINDINGS: RIGHT LOWER EXTREMITY Common Femoral Vein: No evidence of thrombus. Normal compressibility, respiratory phasicity and response to augmentation. Saphenofemoral Junction: No evidence of thrombus. Normal compressibility and flow on color Doppler imaging. Profunda Femoral Vein: No evidence of thrombus. Normal compressibility and flow on color Doppler imaging. Femoral Vein: No evidence of thrombus. Normal compressibility,  respiratory phasicity and response to augmentation. Popliteal Vein: No evidence of thrombus. Normal compressibility, respiratory phasicity and response to augmentation. Calf Veins: No evidence of thrombus. Normal compressibility and flow on color Doppler imaging. Superficial Great Saphenous Vein: No evidence of thrombus. Normal compressibility. Other Findings:  None. LEFT LOWER EXTREMITY Common Femoral Vein: No evidence of thrombus. Normal compressibility, respiratory phasicity and response to augmentation. Saphenofemoral Junction: No evidence of thrombus. Normal compressibility and flow on color Doppler imaging. Profunda Femoral Vein: No evidence of thrombus. Normal compressibility and flow on color Doppler imaging. Femoral Vein: No evidence of thrombus. Normal compressibility, respiratory phasicity and response to augmentation. Popliteal Vein: No evidence of thrombus. Normal compressibility, respiratory phasicity and response to augmentation. Calf Veins: No evidence of thrombus. Normal compressibility and flow on color Doppler imaging. Superficial Great Saphenous Vein: No evidence of thrombus. Normal compressibility. Other Findings:  None. IMPRESSION: No evidence of DVT within either lower extremity. Electronically Signed   By: Simonne Come M.D.   On: 10/04/2023 12:58   CT Angio Chest PE W and/or Wo Contrast Result Date: 10/03/2023 CLINICAL DATA:  Evaluate for pulmonary embolus. Positive D-dimer. Loss of consciousness. EXAM: CT ANGIOGRAPHY CHEST WITH CONTRAST TECHNIQUE:  Multidetector CT imaging of the chest was performed using the standard protocol during bolus administration of intravenous contrast. Multiplanar CT image reconstructions and MIPs were obtained to evaluate the vascular anatomy. RADIATION DOSE REDUCTION: This exam was performed according to the departmental dose-optimization program which includes automated exposure control, adjustment of the mA and/or kV according to patient size and/or use of  iterative reconstruction technique. CONTRAST:  75mL OMNIPAQUE IOHEXOL 350 MG/ML SOLN COMPARISON:  01/21/2019 FINDINGS: Cardiovascular: Satisfactory opacification of the pulmonary arteries to the segmental level. No evidence of pulmonary embolism. Normal heart size. No pericardial effusion. Aortic atherosclerosis and coronary artery calcifications. Mediastinum/Nodes: Thyroid gland, trachea and esophagus demonstrate no significant abnormality. Prominent mediastinal lymph nodes are identified. The largest is in the subcarinal region measuring 1.3 cm. Lungs/Pleura: Small left pleural effusion identified, image 67/4. Overlying soft tissue nodule measures 1.5 x 0.9 cm, image 70/4. No airspace consolidation. Linear areas of subsegmental atelectasis versus scarring noted within the lower lung zones. Right upper lobe lung nodule measures 3 mm, image 52/6. This is unchanged when compared with the previous exam and is compatible with a benign abnormality. Upper Abdomen: No acute abnormality. Simple appearing left kidney cyst measures 2.9 cm. No follow-up imaging recommended. Musculoskeletal: No chest wall abnormality. No acute or significant osseous findings. Review of the MIP images confirms the above findings. IMPRESSION: 1. No evidence for acute pulmonary embolus. 2. Small left pleural effusion. Overlying soft tissue nodule measures 1.5 x 0.9 cm. Etiology is indeterminate. Consider one of the following in 3 months for both low-risk and high-risk individuals: (a) repeat chest CT, (b) follow-up PET-CT, or (c) tissue sampling. This recommendation follows the consensus statement: Guidelines for Management of Incidental Pulmonary Nodules Detected on CT Images: From the Fleischner Society 2017; Radiology 2017; 284:228-243. 3. Coronary artery calcifications. 4.  Aortic Atherosclerosis (ICD10-I70.0). Electronically Signed   By: Signa Kell M.D.   On: 10/03/2023 19:45   DG Chest Portable 1 View Result Date: 10/03/2023 CLINICAL  DATA:  Weakness, loss of consciousness. EXAM: PORTABLE CHEST 1 VIEW COMPARISON:  Jan 20, 2019. FINDINGS: The heart size and mediastinal contours are within normal limits. Elevated right hemidiaphragm is noted. Both lungs are clear. The visualized skeletal structures are unremarkable. IMPRESSION: No active disease. Electronically Signed   By: Lupita Raider M.D.   On: 10/03/2023 15:24   CT Head Wo Contrast Result Date: 10/03/2023 CLINICAL DATA:  Syncope/presyncope. Cerebrovascular cause suspected. EXAM: CT HEAD WITHOUT CONTRAST TECHNIQUE: Contiguous axial images were obtained from the base of the skull through the vertex without intravenous contrast. RADIATION DOSE REDUCTION: This exam was performed according to the departmental dose-optimization program which includes automated exposure control, adjustment of the mA and/or kV according to patient size and/or use of iterative reconstruction technique. COMPARISON:  04/09/2022 FINDINGS: Brain: No focal abnormality affects the brainstem. Old cerebellar infarctions on the left. Ordinary chronic small-vessel ischemic change of the cerebral hemispheric white matter. No sign of acute infarction, mass lesion, hemorrhage, hydrocephalus or extra-axial collection. Vascular: There is atherosclerotic calcification of the major vessels at the base of the brain. Skull: Negative Sinuses/Orbits: Clear/normal Other: None IMPRESSION: No acute CT finding. 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 Ordinary chronic small-vessel ischemic change of the cerebral hemispheric white matter. Electronically Signed   By: Paulina Fusi M.D.   On: 10/03/2023 15:11    Microbiology: Results for orders placed or performed during the hospital encounter of 10/03/23  Resp panel by RT-PCR (RSV, Flu A&B, Covid) Anterior Nasal Swab  Status: None   Collection Time: 10/03/23  2:20 PM   Specimen: Anterior Nasal Swab  Result Value Ref Range Status   SARS Coronavirus 2 by RT PCR NEGATIVE NEGATIVE Final     Comment: (NOTE) SARS-CoV-2 target nucleic acids are NOT DETECTED.  The SARS-CoV-2 RNA is generally detectable in upper respiratory specimens during the acute phase of infection. The lowest concentration of SARS-CoV-2 viral copies this assay can detect is 138 copies/mL. A negative result does not preclude SARS-Cov-2 infection and should not be used as the sole basis for treatment or other patient management decisions. A negative result may occur with  improper specimen collection/handling, submission of specimen other than nasopharyngeal swab, presence of viral mutation(s) within the areas targeted by this assay, and inadequate number of viral copies(<138 copies/mL). A negative result must be combined with clinical observations, patient history, and epidemiological information. The expected result is Negative.  Fact Sheet for Patients:  BloggerCourse.com  Fact Sheet for Healthcare Providers:  SeriousBroker.it  This test is no t yet approved or cleared by the [place] FDA and  has been authorized for detection and/or diagnosis of SARS-CoV-2 by FDA under an Emergency Use Authorization (EUA). This EUA will remain  in effect (meaning this test can be used) for the duration of the COVID-19 declaration under Section 564(b)(1) of the Act, 21 U.S.C.section 360bbb-3(b)(1), unless the authorization is terminated  or revoked sooner.       Influenza A by PCR NEGATIVE NEGATIVE Final   Influenza B by PCR NEGATIVE NEGATIVE Final    Comment: (NOTE) The Xpert Xpress SARS-CoV-2/FLU/RSV plus assay is intended as an aid in the diagnosis of influenza from Nasopharyngeal swab specimens and should not be used as a sole basis for treatment. Nasal washings and aspirates are unacceptable for Xpert Xpress SARS-CoV-2/FLU/RSV testing.  Fact Sheet for Patients: BloggerCourse.com  Fact Sheet for Healthcare  Providers: SeriousBroker.it  This test is not yet approved or cleared by the [place] FDA and has been authorized for detection and/or diagnosis of SARS-CoV-2 by FDA under an Emergency Use Authorization (EUA). This EUA will remain in effect (meaning this test can be used) for the duration of the COVID-19 declaration under Section 564(b)(1) of the Act, 21 U.S.C. section 360bbb-3(b)(1), unless the authorization is terminated or revoked.     Resp Syncytial Virus by PCR NEGATIVE NEGATIVE Final    Comment: (NOTE) Fact Sheet for Patients: BloggerCourse.com  Fact Sheet for Healthcare Providers: SeriousBroker.it  This test is not yet approved or cleared by the [place] FDA and has been authorized for detection and/or diagnosis of SARS-CoV-2 by FDA under an Emergency Use Authorization (EUA). This EUA will remain in effect (meaning this test can be used) for the duration of the COVID-19 declaration under Section 564(b)(1) of the Act, 21 U.S.C. section 360bbb-3(b)(1), unless the authorization is terminated or revoked.  Performed at [facility], [address], [place], [place] [address]   Respiratory (~20 pathogens) panel by PCR     Status: None   Collection Time: 10/04/23 12:30 PM  Result Value Ref Range Status   Adenovirus NOT DETECTED NOT DETECTED Final   Coronavirus 229E NOT DETECTED NOT DETECTED Final    Comment: (NOTE) The Coronavirus on the Respiratory Panel, DOES NOT test for the novel  Coronavirus (2019 nCoV)    Coronavirus HKU1 NOT DETECTED NOT DETECTED Final   Coronavirus NL63 NOT DETECTED NOT DETECTED Final   Coronavirus OC43 NOT DETECTED NOT DETECTED Final   Metapneumovirus NOT DETECTED  NOT DETECTED Final   Rhinovirus / Enterovirus NOT DETECTED NOT DETECTED Final   Influenza A NOT DETECTED NOT DETECTED Final   Influenza B NOT DETECTED NOT DETECTED Final   Parainfluenza Virus 1 NOT  DETECTED NOT DETECTED Final   Parainfluenza Virus 2 NOT DETECTED NOT DETECTED Final   Parainfluenza Virus 3 NOT DETECTED NOT DETECTED Final   Parainfluenza Virus 4 NOT DETECTED NOT DETECTED Final   Respiratory Syncytial Virus NOT DETECTED NOT DETECTED Final   Bordetella pertussis NOT DETECTED NOT DETECTED Final   Bordetella Parapertussis NOT DETECTED NOT DETECTED Final   Chlamydophila pneumoniae NOT DETECTED NOT DETECTED Final   Mycoplasma pneumoniae NOT DETECTED NOT DETECTED Final    Comment: Performed at [facility], [address], [place], [place] [address]    Labs: CBC: Recent Labs  Lab 10/03/23 1409  WBC 4.8  NEUTROABS 4.0  HGB 11.7*  HCT 36.9  MCV 95.1  PLT 376   Basic Metabolic Panel: Recent Labs  Lab 10/03/23 1409 10/04/23 0439  NA 133* 134*  K 3.7 3.9  CL 105 105  CO2 20* 21*  GLUCOSE 121* 91  BUN 21 17  CREATININE 1.42* 1.21*  CALCIUM 8.7* 8.5*  MG  --  1.8  PHOS  --  3.1   Liver Function Tests: Recent Labs  Lab 10/03/23 1409  AST 26  ALT 18  ALKPHOS 55  BILITOT 0.5  PROT 7.8  ALBUMIN 3.1*   CBG: No results for input(s): "GLUCAP" in the last 168 hours.  Discharge time spent: greater than 30 minutes.  Signed: Tyrone Nine, MD [facility] 10/05/2023

## 2023-10-06 ENCOUNTER — Encounter (HOSPITAL_COMMUNITY): Payer: 59

## 2023-10-09 ENCOUNTER — Encounter (HOSPITAL_COMMUNITY): Payer: 59

## 2023-10-13 ENCOUNTER — Encounter (HOSPITAL_COMMUNITY): Payer: 59

## 2023-10-13 ENCOUNTER — Encounter (HOSPITAL_COMMUNITY): Payer: Self-pay

## 2023-10-13 NOTE — Therapy (Signed)
[facility] Outpatient Rehabilitation at [facility] [address] [place], [place], [address] Phone: [phone]   Fax:  [phone]  Patient Details  Name: Rebekah [name] MRN: [id number] Date of Birth: [birth year] Referring Provider:  No ref. provider found  Encounter Date: 10/13/2023  Pt called regarding no show #1 today. Pt reporting she has been sick and has been in/out of hospital. Pt forgot to call. Pt is going to PCP tomorrow and wants to keeps her 1/30 appt for now.   Nelida Meuse, PT 10/13/2023, 3:07 PM   [facility] Outpatient Rehabilitation at [facility] [address] [place], [place], [address] Phone: [phone]   Fax:  [phone]

## 2023-10-16 ENCOUNTER — Encounter (HOSPITAL_COMMUNITY): Payer: 59

## 2023-10-20 ENCOUNTER — Encounter (HOSPITAL_COMMUNITY): Payer: 59

## 2023-10-23 ENCOUNTER — Ambulatory Visit (HOSPITAL_COMMUNITY): Payer: 59 | Attending: Nurse Practitioner

## 2023-10-23 ENCOUNTER — Encounter (HOSPITAL_COMMUNITY): Payer: Self-pay

## 2023-10-23 DIAGNOSIS — M6281 Muscle weakness (generalized): Secondary | ICD-10-CM | POA: Diagnosis present

## 2023-10-23 DIAGNOSIS — Z8673 Personal history of transient ischemic attack (TIA), and cerebral infarction without residual deficits: Secondary | ICD-10-CM | POA: Diagnosis present

## 2023-10-23 DIAGNOSIS — Z7409 Other reduced mobility: Secondary | ICD-10-CM | POA: Insufficient documentation

## 2023-10-23 NOTE — Therapy (Addendum)
 OUTPATIENT PHYSICAL THERAPY NEURO TREATMENT   Patient Name: Rebekah [name] MRN: [id number] DOB:1956-06-02, 68 y.o., female Today's Date: 10/23/2023   PCP: Lavona Agent MD REFERRING PROVIDER: Joeann Browning, FNP  END OF SESSION:  PT End of Session - 10/23/23 1622     Visit Number 6    Number of Visits 16    Date for PT Re-Evaluation 10/17/23    Authorization Type [employer] Dual complete    Progress Note Due on Visit 10    PT Start Time 1434    PT Stop Time 1520    PT Time Calculation (min) 46 min    Equipment Utilized During Treatment Gait belt    Activity Tolerance Patient tolerated treatment well    Behavior During Therapy WFL for tasks assessed/performed                Past Medical History:  Diagnosis Date   Arthritis    Rheumatoid   Dysphagia    post CVA   Hypertension    Lupus (systemic lupus erythematosus) (HCC)    Seizure (HCC)    Stroke [facility])    Past Surgical History:  Procedure Laterality Date   BIOPSY  02/21/2022   Procedure: BIOPSY;  Surgeon: Cindie Carlin POUR, DO;  [facility];  Service: Endoscopy;;   CESAREAN SECTION     COLONOSCOPY WITH PROPOFOL  N/A 02/21/2022   Procedure: COLONOSCOPY WITH PROPOFOL ;  Surgeon: Cindie Carlin POUR, DO;  [facility];  Service: Endoscopy;  Laterality: N/A;  2:00pm   ESOPHAGEAL BRUSHING  02/21/2022   Procedure: ESOPHAGEAL BRUSHING;  Surgeon: Cindie Carlin POUR, DO;  [facility];  Service: Endoscopy;;   ESOPHAGOGASTRODUODENOSCOPY (EGD) WITH PROPOFOL  N/A 02/21/2022   Procedure: ESOPHAGOGASTRODUODENOSCOPY (EGD) WITH PROPOFOL ;  Surgeon: Cindie Carlin POUR, DO;  [facility];  Service: Endoscopy;  Laterality: N/A;   POLYPECTOMY  02/21/2022   Procedure: POLYPECTOMY;  Surgeon: Cindie Carlin POUR, DO;  [facility];  Service: Endoscopy;;   Patient Active Problem List   Diagnosis Date Noted   Syncope and collapse 10/04/2023   Vitamin B 12 deficiency 05/08/2023   Failure to  thrive in adult 04/26/2022   Oropharyngeal dysphagia 03/29/2022   Systemic lupus erythematosus (HCC) 03/29/2022   Diverticulosis 03/05/2022   Colon polyp 03/05/2022   Palpable abdominal aorta 03/05/2022   Protein-calorie malnutrition, severe (HCC) 02/20/2022   Hypokalemia 02/19/2022   Weight loss 01/15/2022   Neurocognitive deficits 01/09/2022   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) 01/09/2022   Hypothyroidism 01/09/2022   History of discoid lupus erythematosus 01/09/2022   GERD without esophagitis 01/09/2022   Chronic constipation 01/09/2022   Major depression, recurrent, chronic (HCC) 01/09/2022   Chronic anemia 01/09/2022   Thrombocytosis 01/09/2022   Hyperlipidemia LDL goal <70 01/09/2022   Iron deficiency anemia 09/29/2021   Insomnia due to medical condition 07/16/2021   Abnormality of gait as late effect of cerebrovascular accident (CVA) 07/16/2021   History of stroke 05/15/2021   Benign essential HTN 10/03/2018   Chronic kidney disease (CKD), stage IV (severe) (HCC) 10/03/2018   Rheumatoid arthritis involving multiple sites with positive rheumatoid factor (HCC) 01/16/2016    ONSET DATE: 20232  REFERRING DIAG: Z86.73 (ICD-10-CM) - Personal history of transient ischemic attack (TIA), and cerebral infarction without residual deficits   THERAPY DIAG:  Personal history of transient cerebral ischemia  Impaired functional mobility, balance, gait, and endurance  Muscle weakness (generalized)  Rationale for Evaluation and Treatment: Rehabilitation  SUBJECTIVE:                                                                                                                                                                                             SUBJECTIVE STATEMENT: Pt with [place]opal episode in [employer] with 3 days in hospital.  Pt arrived in [facility], has been walking with RW some inside home.    Eval:  Pt reporting 2 strokes in 2023. Pt reports she  can walk small distances. Pt reports living with her son. Pt reports her son and daughter in law assist with some ADLs. Pt presenting in WC and rolling self in therapy gym. Mainly uses manual WC, has RW.  Pt accompanied by: self  PERTINENT HISTORY:  2 Prior strokes in 2023.   PAIN:  Are you having pain? No and Yes: NPRS scale: yes 7/10 Pain location: arthritic pain both hands. Pain description: sore Aggravating factors: woke up with legs crossed Relieving factors: reposition  PRECAUTIONS: None  RED FLAGS: None   WEIGHT BEARING RESTRICTIONS: No  FALLS: Has patient fallen in last 6 months? No  LIVING ENVIRONMENT: Lives with: lives with their son Lives in: House/apartment Stairs: Yes: External: 4 steps; none Has following equipment at home: Single point cane, Walker - 2 wheeled, and Wheelchair (manual)  PLOF: Independent with household mobility with device, Needs assistance with ADLs, and Needs assistance with gait  PATIENT GOALS: I want to walk again.  OBJECTIVE:  Note: Objective measures were completed at Evaluation unless otherwise noted.  DIAGNOSTIC FINDINGS:  CLINICAL DATA:  Found down, history of seizures   EXAM: CT HEAD WITHOUT CONTRAST   TECHNIQUE: Contiguous axial images were obtained from the base of the skull through the vertex without intravenous contrast.   RADIATION DOSE REDUCTION: This exam was performed according to the departmental dose-optimization program which includes automated exposure control, adjustment of the mA and/or kV according to patient size and/or use of iterative reconstruction technique.   COMPARISON:  11/30/2021   FINDINGS: Brain: No evidence of acute infarction, hemorrhage, mass, mass effect, or midline shift. No hydrocephalus or extra-axial fluid collection. Periventricular white matter changes, likely the sequela of chronic small vessel ischemic disease.   Vascular: No hyperdense vessel. Atherosclerotic calcifications  in the intracranial carotid and vertebral arteries.   Skull: Normal. Negative for fracture or focal lesion. Left parietal vertex scalp hematoma.   Sinuses/Orbits: No acute finding.   Other: The mastoid air cells are well aerated.   IMPRESSION: 1.  No acute intracranial process.   2.  Left parietal vertex scalp hematoma.  COGNITION: Overall cognitive status: Within functional limits for tasks  assessed   SENSATION: WFL  COORDINATION: Finger/Nose: mild tremors at end range of reaching for finger bilaterally Supination/Pronation: intact Heel/shine test: Intact  MUSCLE TONE:   Clonus (+) R ankle  POSTURE: rounded shoulders and forward head  LOWER EXTREMITY ROM:     Active  Right Eval Left Eval Right 10/23/23 Left 10/23/23  Hip flexion      Hip extension      Hip abduction      Hip adduction      Hip internal rotation      Hip external rotation      Knee flexion      Knee extension -24 from 0 -26 from 0 18 from 0 17 from 0  Ankle dorsiflexion      Ankle plantarflexion      Ankle inversion      Ankle eversion       (Blank rows = not tested)  LOWER EXTREMITY MMT:    MMT Right Eval Left Eval Right 10/23/23 Left 10/23/23  Hip flexion      Hip extension      Hip abduction      Hip adduction      Hip internal rotation      Hip external rotation      Knee flexion      Knee extension 3+ 3+ 4- 4/5  Ankle dorsiflexion      Ankle plantarflexion      Ankle inversion      Ankle eversion      (Blank rows = not tested)  GAIT: Gait pattern: knee flexed in stance- Right, knee flexed in stance- Left, and shuffling Distance walked: 23ft w/ and without AD Assistive device utilized: Walker - 2 wheeled and None Level of assistance: SBA Comments: bilateral knee flexion throughout stance and swing phase.  FUNCTIONAL TESTS:  Timed up and go (TUG): 37 seconds Berg Balance Scale: 38/56  10/23/23: TUG 25.58 with RW  194 with RW  5STS 36.80 no HHA  BERG  44/56    PATIENT SURVEYS:  ABC scale 550/1600 or 34.4% 10/23/23: 1,010/1600= 63%  TODAY'S TREATMENT:                                                                                                                              DATE:   10/23/23:  174ft with RW, therapist pushing WC behind for safety 5 STS 36.80 no HHA Knee ROM-see above TUG25.58 with RW BERG 44/56 ABC 63%  BERG  1. SITTING TO STANDING  4 able to stand without using hands and stabilize independently  2. STANDING UNSUPPORTED  4 able to stand safely for 2 minutes  If a subject is able to stand 2 minutes unsupported, score full points for sitting unsupported. Proceed to item #4  3. SITTING WITH BACK UNSUPPORTED BUT FEET SUPPORTED ON FLOOR OR ON A STOOL 4 able to sit safely and securely for 2 minutes  4. STANDING TO SITTING  4 sits safely with minimal use of hands  5. TRANSFERS 4 able to transfer safely with minor use of hands  6. STANDING UNSUPPORTED WITH EYES CLOSED 4 able to stand 10 seconds safely   7. STANDING UNSUPPORTED WITH FEET TOGETHER 4 able to place feet together independently and stand 1 minute safely  8. REACHING FORWARD WITH OUTSTRETCHED ARM WHILE STANDING  3 can reach forward 12 cm (5 inches)  9. PICK UP OBJECT FROM THE FLOOR FROM A STANDING POSITION  3 able to pick up slipper but needs supervision  10. TURNING TO LOOK BEHIND OVER LEFT AND RIGHT SHOULDERS WHILE STANDING  2 turns sideways only but maintains balance  11. TURN 360 DEGREES 2 able to turn 360 degrees safely but slowly  12. PLACE ALTERNATE FOOT ON STEP OR STOOL WHILE STANDING UNSUPPORTED 3 able to stand independently and complete 8 steps in > 20 seconds   13. STANDING UNSUPPORTED ONE FOOT IN FRONT 2 able to take small step independently and hold 30 seconds  14. STANDING ON ONE LEG 1 tries to lift leg unable to hold 3 seconds but remains standing independently  TOTAL SCORE  44/56 21-40 = walking with  assistance   09/29/2023  -Standing iliopsoas stretch on 12 box 3 x 1' @ // bars-bilaterally -Tandem stance in heel/toe position with sustained holds 3 x 1' bilaterally with CGA for balance deficits.  -226 gait training with RW cues for heel toe pattern and increased step-length.  -4in stair training with single to no UE support min assist for maintaining balance.   09/04/23 -Gait training x 226 cueing for heel to toe mechanics with RW -Sit to stand 10x no HHA, eccentric control -Heel raises incline slope 10x  -Standing gastrocnemius stretch 3x 1' slant board -TKE GTB 5x 5  -Retro gait (toe to heel) to encourage knee extension and gluteal activation 2Rt inside // bars  09/01/2023  -PROM of R Knee extension - 10 degrees -STM of bilateral distal hamstring and proximal gastrocnemius with 2x10' sustained max hold in TKE x 23 -Prone knee hang and POE x 3' -Seated knee bent soleus passive stretch on wedge with inferior force applied x 1' -Standing gastrocnemius stretch 3x 1' at stairs -Standing iliopsoas stretch with tactile cues for hip translation x 1' -discussion regarding equipment at home for assist with passive mobility.   PATIENT EDUCATION: Education details: PT Evaluation, findings, prognosis, frequency, attendance policy, and HEP if given.  Person educated: Patient Education method: Explanation Education comprehension: verbalized understanding  HOME EXERCISE PROGRAM:  Access Code: [email or web address] URL: [email or web address] Date: 08/22/2023 Prepared by: Omega Bottcher  Exercises - Seated Hamstring Stretch  - 1 x daily - 7 x weekly - 3 sets - 10 reps - 30 hold - Prone Press Up On Elbows  - 1 x daily - 7 x weekly - 3 sets - 10 reps  08/26/23: - Prone Knee Extension Hang  - 3 x daily - 7 x weekly - 1 sets - 1 reps - 120 hold - Supine Bridge  - 2 x daily - 7 x weekly - 2 sets - 10 reps - 5 hold - Sit to Stand  - 2 x daily - 7 x weekly - 2 sets - 10 reps - Supine  Knee Extension Strengthening  - 2 x daily - 7 x weekly - 2 sets - 10 reps - 5 hold  GOALS: Goals reviewed with patient? No  SHORT TERM GOALS: Target date: 09/19/23  Pt will be independent with  HEP in order to demonstrate participation in Physical Therapy POC.  Baseline: 10/23/23:  Reports compliance with HEP at least 3 days a week.  Encourage to increase frequency. Goal status: IN PROGRESS  2.  Pt will improve ABC score by > 5 points in order to demonstrate improved pain with functional goals and outcomes Baseline: 10/23/23: ABC 63% Goal status: MET  LONG TERM GOALS: Target date: 10/17/23  Pt will increase Berg Balance Scale score by > 8 points in order to demonstrate improved functional safety and balance skills in ADL/mobility.   Baseline: See objective.  10/23/23:  BERG 44/56 was 38/56 Goal status: IN PROGRESS  2.  Pt will improve TUG score by > 10 seconds in order to demonstrate improved functional mobility capacity in community setting.  Baseline: See objective. 10/23/23: TUG25.58 with RW was 37 Goal status: MET  3.  Pt will improve ABC score by > 10 points in order to demonstrate improved pain with functional goals and outcomes. Baseline: See objective. 10/23/23: ABC 63% Goal status: MET  4.  Pt will improve bilateral knee extension ROM by 10 degrees in order to demonstrate improved self awareness of balance deficits.   Baseline: See objective.   Active  Right Eval Left Eval Right 10/23/23 Left 10/23/23       Eval Right Eval Left Right 10/23/23 Left  10/23/23  -24 from 0 -26 from 0 18 from 0 17 from 0   Goal status: IN PROGRESS   ASSESSMENT:  CLINICAL IMPRESSION: Pt has missed PT sessions since 09/29/23 due to sickness, hospitalization and transportation issues.  Session focus on reviewing goals due to out of cert.  Objective testing complete with the following findings:  Pt has met 1/2 STG and 2/4 LTGs.  Reports compliance with HEP 3 days a week.  Pt presents with improved  confidence with ABC scale indicating improve confidence with balance.  Pt continues to present with ROM, strength and balance deficits that would benefits from additional skilled intervention to address goals unmet.   OBJECTIVE IMPAIRMENTS: Abnormal gait, decreased balance, decreased coordination, decreased mobility, difficulty walking, decreased ROM, and decreased strength.   ACTIVITY LIMITATIONS: lifting, bending, sitting, standing, stairs, bed mobility, and locomotion level  PARTICIPATION LIMITATIONS: community activity and occupation  PERSONAL FACTORS: Age are also affecting patient's functional outcome.   REHAB POTENTIAL: Good  CLINICAL DECISION MAKING: Stable/uncomplicated  EVALUATION COMPLEXITY: Low  PLAN:  PT FREQUENCY: 2x/week  PT DURATION: 8 weeks  PLANNED INTERVENTIONS: 97164- PT Re-evaluation, 97110-Therapeutic exercises, 97530- Therapeutic activity, [id number]- Neuromuscular re-education, 97535- Self Care, [id number]- Manual therapy, [phone]- Gait training, [phone]- Orthotic Fit/training, Patient/Family education, and Balance training  PLAN FOR NEXT SESSION: balance training, Knee extension ROM, strengthening, gait.  Continue 2x/ week for 4 more weeks to address goals unmet.  Augustin Mclean, LPTA/CLT; CBIS [phone]  Mclean Augustin Amble, PTA 10/23/2023, 4:23 PM

## 2023-10-27 NOTE — Addendum Note (Signed)
 Addended by: Irene Mannheim D on: 10/27/2023 08:58 AM   Modules accepted: Orders

## 2023-10-28 ENCOUNTER — Encounter (HOSPITAL_COMMUNITY): Payer: 59

## 2023-10-29 ENCOUNTER — Ambulatory Visit: Payer: 59 | Admitting: Gastroenterology

## 2023-10-30 ENCOUNTER — Encounter: Payer: Self-pay | Admitting: Gastroenterology

## 2023-11-06 ENCOUNTER — Institutional Professional Consult (permissible substitution): Payer: 59 | Admitting: Neurology

## 2023-11-07 ENCOUNTER — Encounter (HOSPITAL_COMMUNITY): Payer: 59

## 2023-11-10 ENCOUNTER — Encounter (HOSPITAL_COMMUNITY): Payer: Self-pay

## 2023-11-10 ENCOUNTER — Encounter (HOSPITAL_COMMUNITY): Payer: 59

## 2023-11-10 NOTE — Therapy (Signed)
 [facility] Outpatient Rehabilitation at [facility] [address] [place], [place], [address] Phone: [phone]   Fax:  [phone]  Patient Details  Name: Rebekah [name] MRN: [id number] Date of Birth: [birth year] Referring Provider:  No ref. provider found  Encounter Date: 11/10/2023  Pt called regarding no show #2. Pt was informed of inconsistent showing and potential need for DC. Pt informed to call back to discuss PT POC. Pt informed/reminded of no show policy.   Nelida Meuse, PT 11/10/2023, 2:58 PM  [facility] Outpatient Rehabilitation at [facility] [address] [place], [place], [address] Phone: [phone]   Fax:  [phone]

## 2023-11-13 ENCOUNTER — Encounter (HOSPITAL_COMMUNITY): Payer: 59

## 2023-12-15 ENCOUNTER — Encounter: Payer: Self-pay | Admitting: Neurology

## 2023-12-16 ENCOUNTER — Institutional Professional Consult (permissible substitution): Payer: 59 | Admitting: Neurology

## 2024-01-12 ENCOUNTER — Other Ambulatory Visit (HOSPITAL_COMMUNITY): Payer: Self-pay | Admitting: Nurse Practitioner

## 2024-01-12 DIAGNOSIS — R911 Solitary pulmonary nodule: Secondary | ICD-10-CM

## 2024-01-21 ENCOUNTER — Telehealth: Payer: Self-pay | Admitting: Neurology

## 2024-01-21 NOTE — Telephone Encounter (Signed)
 LVM and sent mychart msg informing pt of need to reschedule 01/29/24 appt - MD in meeting

## 2024-01-29 ENCOUNTER — Institutional Professional Consult (permissible substitution): Admitting: Neurology

## 2024-03-03 ENCOUNTER — Ambulatory Visit (HOSPITAL_COMMUNITY)
Admission: RE | Admit: 2024-03-03 | Discharge: 2024-03-03 | Disposition: A | Discharge status: Home / Self Care | Source: Ambulatory Visit | Attending: Nurse Practitioner | Admitting: Nurse Practitioner

## 2024-03-03 DIAGNOSIS — Z13820 Encounter for screening for osteoporosis: Secondary | ICD-10-CM

## 2024-03-03 DIAGNOSIS — M8589 Other specified disorders of bone density and structure, multiple sites: Secondary | ICD-10-CM | POA: Diagnosis not present

## 2024-03-03 DIAGNOSIS — Z780 Asymptomatic menopausal state: Secondary | ICD-10-CM | POA: Diagnosis not present

## 2024-03-03 DIAGNOSIS — Z1231 Encounter for screening mammogram for malignant neoplasm of breast: Secondary | ICD-10-CM | POA: Insufficient documentation

## 2024-03-15 ENCOUNTER — Other Ambulatory Visit (HOSPITAL_COMMUNITY): Payer: Self-pay | Admitting: Nurse Practitioner

## 2024-03-15 DIAGNOSIS — R928 Other abnormal and inconclusive findings on diagnostic imaging of breast: Secondary | ICD-10-CM

## 2024-03-30 ENCOUNTER — Ambulatory Visit (HOSPITAL_COMMUNITY): Admission: RE | Admit: 2024-03-30 | Source: Ambulatory Visit

## 2024-03-30 ENCOUNTER — Encounter (HOSPITAL_COMMUNITY): Payer: Self-pay

## 2024-03-30 ENCOUNTER — Inpatient Hospital Stay (HOSPITAL_COMMUNITY): Admission: RE | Admit: 2024-03-30 | Source: Ambulatory Visit

## 2024-03-31 DIAGNOSIS — H9192 Unspecified hearing loss, left ear: Secondary | ICD-10-CM | POA: Diagnosis not present

## 2024-03-31 DIAGNOSIS — M1990 Unspecified osteoarthritis, unspecified site: Secondary | ICD-10-CM | POA: Diagnosis not present

## 2024-03-31 DIAGNOSIS — M3214 Glomerular disease in systemic lupus erythematosus: Secondary | ICD-10-CM | POA: Diagnosis not present

## 2024-03-31 DIAGNOSIS — M329 Systemic lupus erythematosus, unspecified: Secondary | ICD-10-CM | POA: Diagnosis not present

## 2024-04-28 DIAGNOSIS — J302 Other seasonal allergic rhinitis: Secondary | ICD-10-CM | POA: Diagnosis not present

## 2024-04-28 DIAGNOSIS — M069 Rheumatoid arthritis, unspecified: Secondary | ICD-10-CM | POA: Diagnosis not present

## 2024-04-28 DIAGNOSIS — H6122 Impacted cerumen, left ear: Secondary | ICD-10-CM | POA: Diagnosis not present

## 2024-04-28 DIAGNOSIS — E039 Hypothyroidism, unspecified: Secondary | ICD-10-CM | POA: Diagnosis not present

## 2024-04-28 DIAGNOSIS — R030 Elevated blood-pressure reading, without diagnosis of hypertension: Secondary | ICD-10-CM | POA: Diagnosis not present

## 2024-04-28 DIAGNOSIS — R110 Nausea: Secondary | ICD-10-CM | POA: Diagnosis not present

## 2024-04-28 DIAGNOSIS — N319 Neuromuscular dysfunction of bladder, unspecified: Secondary | ICD-10-CM | POA: Diagnosis not present

## 2024-04-28 DIAGNOSIS — L282 Other prurigo: Secondary | ICD-10-CM | POA: Diagnosis not present

## 2024-04-28 DIAGNOSIS — E785 Hyperlipidemia, unspecified: Secondary | ICD-10-CM | POA: Diagnosis not present

## 2024-04-28 DIAGNOSIS — R944 Abnormal results of kidney function studies: Secondary | ICD-10-CM | POA: Diagnosis not present

## 2024-05-19 DIAGNOSIS — Z8673 Personal history of transient ischemic attack (TIA), and cerebral infarction without residual deficits: Secondary | ICD-10-CM | POA: Diagnosis not present

## 2024-05-19 DIAGNOSIS — L400 Psoriasis vulgaris: Secondary | ICD-10-CM | POA: Diagnosis not present

## 2024-05-19 DIAGNOSIS — R030 Elevated blood-pressure reading, without diagnosis of hypertension: Secondary | ICD-10-CM | POA: Diagnosis not present

## 2024-05-19 DIAGNOSIS — E039 Hypothyroidism, unspecified: Secondary | ICD-10-CM | POA: Diagnosis not present

## 2024-06-07 ENCOUNTER — Ambulatory Visit (HOSPITAL_COMMUNITY)

## 2024-06-08 NOTE — Therapy (Signed)
 OUTPATIENT PHYSICAL THERAPY NEURO EVALUATION   Patient Name: Rebekah [name] MRN: [id number] DOB:1955/11/19, 68 y.o., female Today's Date: 06/09/2024   PCP: Joeann Browning, FNP REFERRING PROVIDER: Joeann Browning, FNP  END OF SESSION:  PT End of Session - 06/09/24 1508     Visit Number 1    Number of Visits 6    Date for Recertification  07/21/24    Authorization Type [employer] Dual complete    Authorization Time Period No auth no limit    PT Start Time 1503    PT Stop Time 1543    PT Time Calculation (min) 40 min    Equipment Utilized During Treatment Gait belt    Activity Tolerance Patient tolerated treatment well    Behavior During Therapy WFL for tasks assessed/performed          Past Medical History:  Diagnosis Date   Arthritis    Rheumatoid   Dysphagia    post CVA   Hyperlipidemia    Hypertension    Hyperthyroidism    Lupus (systemic lupus erythematosus) (HCC)    Seizure (HCC)    Stroke [facility])    Past Surgical History:  Procedure Laterality Date   BIOPSY  02/21/2022   Procedure: BIOPSY;  Surgeon: Cindie Carlin POUR, DO;  [facility];  Service: Endoscopy;;   CESAREAN SECTION     COLONOSCOPY WITH PROPOFOL  N/A 02/21/2022   Procedure: COLONOSCOPY WITH PROPOFOL ;  Surgeon: Cindie Carlin POUR, DO;  Location: [facility];  Service: Endoscopy;  Laterality: N/A;  2:00pm   ESOPHAGEAL BRUSHING  02/21/2022   Procedure: ESOPHAGEAL BRUSHING;  Surgeon: Cindie Carlin POUR, DO;  [facility];  Service: Endoscopy;;   ESOPHAGOGASTRODUODENOSCOPY (EGD) WITH PROPOFOL  N/A 02/21/2022   Procedure: ESOPHAGOGASTRODUODENOSCOPY (EGD) WITH PROPOFOL ;  Surgeon: Cindie Carlin POUR, DO;  [facility];  Service: Endoscopy;  Laterality: N/A;   POLYPECTOMY  02/21/2022   Procedure: POLYPECTOMY;  Surgeon: Cindie Carlin POUR, DO;  [facility];  Service: Endoscopy;;   Patient Active Problem List   Diagnosis Date Noted   Syncope and collapse 10/04/2023   Vitamin  B 12 deficiency 05/08/2023   Failure to thrive in adult 04/26/2022   Oropharyngeal dysphagia 03/29/2022   Systemic lupus erythematosus (HCC) 03/29/2022   Diverticulosis 03/05/2022   Colon polyp 03/05/2022   Palpable abdominal aorta 03/05/2022   Protein-calorie malnutrition, severe 02/20/2022   Hypokalemia 02/19/2022   Weight loss 01/15/2022   Neurocognitive deficits 01/09/2022   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) 01/09/2022   Hypothyroidism 01/09/2022   History of discoid lupus erythematosus 01/09/2022   GERD without esophagitis 01/09/2022   Chronic constipation 01/09/2022   Major depression, recurrent, chronic 01/09/2022   Chronic anemia 01/09/2022   Thrombocytosis 01/09/2022   Hyperlipidemia LDL goal <70 01/09/2022   Iron deficiency anemia 09/29/2021   Insomnia due to medical condition 07/16/2021   Abnormality of gait as late effect of cerebrovascular accident (CVA) 07/16/2021   History of stroke 05/15/2021   Benign essential HTN 10/03/2018   Chronic kidney disease (CKD), stage IV (severe) (HCC) 10/03/2018   Rheumatoid arthritis involving multiple sites with positive rheumatoid factor (HCC) 01/16/2016    ONSET DATE: 2023 (2 strokes)  REFERRING DIAG: Z86.73 (ICD-10-CM) - Personal history of transient ischemic attack (TIA), and cerebral infarction without residual deficits  THERAPY DIAG:  Difficulty in walking, not elsewhere classified  Muscle weakness (generalized)  Other abnormalities of gait and mobility  Rationale for Evaluation and Treatment:  Rehabilitation  SUBJECTIVE:                                                                                                                                                                                             SUBJECTIVE STATEMENT: Had 2 strokes back in 2023 and staying in bed made her weak and hamstrings tight.  Was coming here earlier this year until had to go into the hospital with arthritis  and lupus (but lupus ruled out per patient)  and had to stop coming.  She state she is moving better overall; arrives in [facility] and self propels back to the gym.  She goes by [facility]; reports she uses a rollator at home but easier to use [facility] when transportation comes and picks her up.   Pt accompanied by: self  PERTINENT HISTORY:  RA, HTN, stroke 2022, seizure  PAIN:  Are you having pain? Yes: NPRS scale: 7/10 Pain location: knees Pain description: tight and achey Aggravating factors: at night Relieving factors: heat or try to stretch out  PRECAUTIONS: Fall   WEIGHT BEARING RESTRICTIONS: No  FALLS: Has patient fallen in last 6 months? No  LIVING ENVIRONMENT: Lives with: lives with their son Lives in: House/apartment Stairs: Yes: Internal: 12 steps; on right going up, on left going up, and can reach both and External: 3 steps; on right going up, on left going up, and can reach both Has following equipment at home: Single point cane, Walker - 4 wheeled, Wheelchair (manual), and bed side commode  PLOF: Independent with household mobility with device  PATIENT GOALS: be able to walk better  OBJECTIVE:  Note: Objective measures were completed at Evaluation unless otherwise noted.  DIAGNOSTIC FINDINGS:   COGNITION: Overall cognitive status: Within functional limits for tasks assessed   SENSATION: Denies N/T  COORDINATION:  EDEMA:  Some in left leg occassionally  MUSCLE TONE: appears wnl  MUSCLE LENGTH: Hamstrings:    POSTURE: rounded shoulders, forward head, flexed trunk , and flexed knees  LOWER EXTREMITY ROM:     Active  Right Eval Left Eval  Hip flexion    Hip extension    Hip abduction    Hip adduction    Hip internal rotation    Hip external rotation    Knee flexion 128 125  Knee extension -15 -25  Ankle dorsiflexion    Ankle plantarflexion    Ankle inversion    Ankle eversion     (Blank rows = not tested)  LOWER EXTREMITY MMT:    MMT Right Eval  Left Eval  Hip flexion 5 4  Hip extension    Hip  abduction    Hip adduction    Hip internal rotation    Hip external rotation    Knee flexion    Knee extension 4+ 4 (knee extension lag)  Ankle dorsiflexion 5 4+  Ankle plantarflexion    Ankle inversion    Ankle eversion    (Blank rows = not tested)  BED MOBILITY:  Findings: Sit to supine Modified independence Supine to sit Modified independence  TRANSFERS: Sit to stand: Modified independence  Assistive device utilized: Environmental consultant - 2 wheeled     Stand to sit: Modified independence  Assistive device utilized: Environmental consultant - 2 wheeled       STAIRS: Not tested GAIT: Findings: Gait Characteristics: decreased step length- Right, decreased stance time- Right, knee flexed in stance- Right, and knee flexed in stance- Left, Distance walked: 20 ft, Assistive device utilized:Walker - 2 wheeled, Level of assistance: SBA and CGA, and Comments: usually uses a rollator at home  FUNCTIONAL TESTS:  5 times sit to stand: 19.41 sec using hands on thighs Timed up and go (TUG): 22.29 using RW SLS  PATIENT SURVEYS:  ABC scale: The Activities-Specific Balance Confidence (ABC) Scale 0% 10 20 30  40 50 60 70 80 90 100% No confidence<->completely confident  "How confident are you that you will not lose your balance or become unsteady when you . . .   Date tested 06/09/24                                                  Total: #/16 850/1600 53.1%                                                                                                                                 TREATMENT DATE: 06/09/24 physical therapy evaluation and HEP instruction    PATIENT EDUCATION: Education details: Patient educated on exam findings, POC, scope of PT, HEP, and what to expect next visit. Person educated: Patient Education method: Explanation, Demonstration, and Handouts Education comprehension: verbalized understanding, returned demonstration, verbal cues  required, and tactile cues required   HOME EXERCISE PROGRAM: Access Code: [email or web address] URL: [email or web address] Date: 06/09/2024 Prepared by: [facility]  Exercises - Sit to Stand  - 2 x daily - 7 x weekly - 1 sets - 10 reps - Supine Quad Set  - 2 x daily - 7 x weekly - 1 sets - 10 reps - 5 sec hold - Seated Knee Extension Stretch with Chair  - 2 x daily - 7 x weekly - 1 sets - 1 reps - 5 min hold - quad set with heel elevated  - 2 x daily - 7 x weekly - 1 sets - 1 reps - 5 min hold  GOALS: Goals reviewed with patient? No  SHORT TERM GOALS: Target date: 06/30/2024  patient will be independent with initial HEP  Baseline: Goal status: INITIAL  2.  Patient will report 30% improvement overall  Baseline:  Goal status: INITIAL   LONG TERM GOALS: Target date: 07/21/2024  Patient will be independent in self management strategies to improve quality of life and functional outcomes.  Baseline:  Goal status: INITIAL  2.  Patient will report 50% improvement overall  Baseline:  Goal status: INITIAL  3.  Patient will improve ABC score by 15% to demonstrate improved perceived balance with functional activities  Baseline: 53.1% Goal status: INITIAL  4.  Patient will decrease TUG score to 18 sec or less to demonstrate improved functional mobility and decreased fall risk Baseline: 22.29 sec with RW Goal status: INITIAL  5.  Patient will increase left knee mobility extension to -10 or better to promote normal gait pattern  Baseline: -25 Goal status: INITIAL  ASSESSMENT:  CLINICAL IMPRESSION: Patient is a 68 y.o. female who was seen today for physical therapy evaluation and treatment for Z86.73 (ICD-10-CM) - Personal history of transient ischemic attack (TIA), and cerebral infarction without residual deficits.  Patient demonstrates decreased strength, decreased mobility, balance deficits and gait abnormalities which are negatively impacting patient ability to perform  ADLs and functional mobility tasks. Patient will benefit from skilled physical therapy services to address these deficits to improve level of function with ADLs, functional mobility tasks, and reduce risk for falls.    OBJECTIVE IMPAIRMENTS: Abnormal gait, decreased activity tolerance, decreased balance, difficulty walking, decreased ROM, and decreased strength.   ACTIVITY LIMITATIONS: carrying, lifting, standing, sleeping, and stairs  PARTICIPATION LIMITATIONS: meal prep, cleaning, driving, shopping, and community activity  PERSONAL FACTORS: Transportation and 1-2 comorbidities: RA and HTN are also affecting patient's functional outcome.   REHAB POTENTIAL: Good  CLINICAL DECISION MAKING: Evolving/moderate complexity  EVALUATION COMPLEXITY: Moderate  PLAN:  PT FREQUENCY: 1x/week  PT DURATION: 6 weeks  PLANNED INTERVENTIONS: 97164- PT Re-evaluation, 97110-Therapeutic exercises, 97530- Therapeutic activity, 97112- Neuromuscular re-education, 97535- Self Care, [id number]- Manual therapy, [id number]- Gait training, [phone]- Orthotic Fit/training, [id number]- Canalith repositioning, [id number]- Aquatic Therapy, [phone]- Splinting, [phone]- Wound care (first 20 sq cm), 97598- Wound care (each additional 20 sq cm)Patient/Family education, Balance training, Stair training, Taping, Dry Needling, Joint mobilization, Joint manipulation, Spinal manipulation, Spinal mobilization, Scar mobilization, and DME instructions.   PLAN FOR NEXT SESSION: Review HEP and goals; will attend 1 time a week due to transportation limitation; check Hamstring length next visit; SLS; work on lower extremity mobility, strengthening and functional gait and balance; steps   3:42 PM, 06/09/24 Kahdijah Errickson Small Chesney Suares MPT [place] [facility] [place] [phone] Ph:[phone]

## 2024-06-09 ENCOUNTER — Other Ambulatory Visit: Payer: Self-pay

## 2024-06-09 ENCOUNTER — Ambulatory Visit (HOSPITAL_COMMUNITY): Attending: Nurse Practitioner

## 2024-06-09 DIAGNOSIS — R262 Difficulty in walking, not elsewhere classified: Secondary | ICD-10-CM | POA: Diagnosis not present

## 2024-06-09 DIAGNOSIS — R2689 Other abnormalities of gait and mobility: Secondary | ICD-10-CM | POA: Diagnosis not present

## 2024-06-09 DIAGNOSIS — M6281 Muscle weakness (generalized): Secondary | ICD-10-CM | POA: Diagnosis not present

## 2024-06-25 ENCOUNTER — Ambulatory Visit (HOSPITAL_COMMUNITY): Attending: Nurse Practitioner

## 2024-06-25 ENCOUNTER — Encounter (HOSPITAL_COMMUNITY): Payer: Self-pay

## 2024-06-25 DIAGNOSIS — R262 Difficulty in walking, not elsewhere classified: Secondary | ICD-10-CM | POA: Diagnosis present

## 2024-06-25 DIAGNOSIS — Z8673 Personal history of transient ischemic attack (TIA), and cerebral infarction without residual deficits: Secondary | ICD-10-CM | POA: Insufficient documentation

## 2024-06-25 DIAGNOSIS — R2689 Other abnormalities of gait and mobility: Secondary | ICD-10-CM | POA: Insufficient documentation

## 2024-06-25 DIAGNOSIS — M6281 Muscle weakness (generalized): Secondary | ICD-10-CM | POA: Diagnosis present

## 2024-06-25 DIAGNOSIS — Z7409 Other reduced mobility: Secondary | ICD-10-CM | POA: Insufficient documentation

## 2024-06-25 NOTE — Therapy (Signed)
 OUTPATIENT PHYSICAL THERAPY NEURO EVALUATION   Patient Name: Rebekah [name] MRN: [id number] DOB:1956/05/23, 68 y.o., female Today's Date: 06/25/2024   PCP: Joeann Browning, FNP REFERRING PROVIDER: Joeann Browning, FNP  END OF SESSION:  PT End of Session - 06/25/24 1501     Visit Number 2    Number of Visits 6    Date for Recertification  07/21/24    Authorization Type [employer] Dual complete    Authorization Time Period No auth no limit    PT Start Time 1501    PT Stop Time 1543    PT Time Calculation (min) 42 min    Equipment Utilized During Treatment Gait belt    Activity Tolerance Patient tolerated treatment well    Behavior During Therapy WFL for tasks assessed/performed           Past Medical History:  Diagnosis Date   Arthritis    Rheumatoid   Dysphagia    post CVA   Hyperlipidemia    Hypertension    Hyperthyroidism    Lupus (systemic lupus erythematosus) (HCC)    Seizure (HCC)    Stroke [facility])    Past Surgical History:  Procedure Laterality Date   BIOPSY  02/21/2022   Procedure: BIOPSY;  Surgeon: Cindie Carlin POUR, DO;  [facility];  Service: Endoscopy;;   CESAREAN SECTION     COLONOSCOPY WITH PROPOFOL  N/A 02/21/2022   Procedure: COLONOSCOPY WITH PROPOFOL ;  Surgeon: Cindie Carlin POUR, DO;  Location: [facility];  Service: Endoscopy;  Laterality: N/A;  2:00pm   ESOPHAGEAL BRUSHING  02/21/2022   Procedure: ESOPHAGEAL BRUSHING;  Surgeon: Cindie Carlin POUR, DO;  [facility];  Service: Endoscopy;;   ESOPHAGOGASTRODUODENOSCOPY (EGD) WITH PROPOFOL  N/A 02/21/2022   Procedure: ESOPHAGOGASTRODUODENOSCOPY (EGD) WITH PROPOFOL ;  Surgeon: Cindie Carlin POUR, DO;  [facility];  Service: Endoscopy;  Laterality: N/A;   POLYPECTOMY  02/21/2022   Procedure: POLYPECTOMY;  Surgeon: Cindie Carlin POUR, DO;  [facility];  Service: Endoscopy;;   Patient Active Problem List   Diagnosis Date Noted   Syncope and collapse 10/04/2023    Vitamin B 12 deficiency 05/08/2023   Failure to thrive in adult 04/26/2022   Oropharyngeal dysphagia 03/29/2022   Systemic lupus erythematosus (HCC) 03/29/2022   Diverticulosis 03/05/2022   Colon polyp 03/05/2022   Palpable abdominal aorta 03/05/2022   Protein-calorie malnutrition, severe 02/20/2022   Hypokalemia 02/19/2022   Weight loss 01/15/2022   Neurocognitive deficits 01/09/2022   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) 01/09/2022   Hypothyroidism 01/09/2022   History of discoid lupus erythematosus 01/09/2022   GERD without esophagitis 01/09/2022   Chronic constipation 01/09/2022   Major depression, recurrent, chronic 01/09/2022   Chronic anemia 01/09/2022   Thrombocytosis 01/09/2022   Hyperlipidemia LDL goal <70 01/09/2022   Iron deficiency anemia 09/29/2021   Insomnia due to medical condition 07/16/2021   Abnormality of gait as late effect of cerebrovascular accident (CVA) 07/16/2021   History of stroke 05/15/2021   Benign essential HTN 10/03/2018   Chronic kidney disease (CKD), stage IV (severe) (HCC) 10/03/2018   Rheumatoid arthritis involving multiple sites with positive rheumatoid factor (HCC) 01/16/2016    ONSET DATE: 2023 (2 strokes)  REFERRING DIAG: Z86.73 (ICD-10-CM) - Personal history of transient ischemic attack (TIA), and cerebral infarction without residual deficits  THERAPY DIAG:  Difficulty in walking, not elsewhere classified  Muscle weakness (generalized)  Other abnormalities of gait and mobility  Personal history of transient  cerebral ischemia  Impaired functional mobility, balance, gait, and endurance  Rationale for Evaluation and Treatment: Rehabilitation  SUBJECTIVE:                                                                                                                                                                                             SUBJECTIVE STATEMENT: Pt states she has been compliant with HEP,  states the one with towel underneath the knee is painful sometimes. Pt reports no pain or falls this date. Pt presents with WC but would like to walk better, normally uses RW at home.   Eval: Had 2 strokes back in 2023 and staying in bed made her weak and hamstrings tight.  Was coming here earlier this year until had to go into the hospital with arthritis and lupus (but lupus ruled out per patient)  and had to stop coming.  She state she is moving better overall; arrives in [facility] and self propels back to the gym.  She goes by [facility]; reports she uses a rollator at home but easier to use [facility] when transportation comes and picks her up.   Pt accompanied by: self  PERTINENT HISTORY:  RA, HTN, stroke 2022, seizure  PAIN:  Are you having pain? Yes: NPRS scale: 7/10 Pain location: knees Pain description: tight and achey Aggravating factors: at night Relieving factors: heat or try to stretch out  PRECAUTIONS: Fall   WEIGHT BEARING RESTRICTIONS: No  FALLS: Has patient fallen in last 6 months? No  LIVING ENVIRONMENT: Lives with: lives with their son Lives in: House/apartment Stairs: Yes: Internal: 12 steps; on right going up, on left going up, and can reach both and External: 3 steps; on right going up, on left going up, and can reach both Has following equipment at home: Single point cane, Walker - 4 wheeled, Wheelchair (manual), and bed side commode  PLOF: Independent with household mobility with device  PATIENT GOALS: be able to walk better  OBJECTIVE:  Note: Objective measures were completed at Evaluation unless otherwise noted.  DIAGNOSTIC FINDINGS:   COGNITION: Overall cognitive status: Within functional limits for tasks assessed   SENSATION: Denies N/T  COORDINATION:  EDEMA:  Some in left leg occassionally  MUSCLE TONE: appears wnl  MUSCLE LENGTH: Hamstrings: L 137 degrees, R 139 degrees   POSTURE: rounded shoulders, forward head, flexed trunk , and flexed  knees  LOWER EXTREMITY ROM:     Active  Right Eval Left Eval  Hip flexion    Hip extension    Hip abduction    Hip adduction    Hip internal rotation  Hip external rotation    Knee flexion 128 125  Knee extension -15 -25  Ankle dorsiflexion    Ankle plantarflexion    Ankle inversion    Ankle eversion     (Blank rows = not tested)  LOWER EXTREMITY MMT:    MMT Right Eval Left Eval  Hip flexion 5 4  Hip extension    Hip abduction    Hip adduction    Hip internal rotation    Hip external rotation    Knee flexion    Knee extension 4+ 4 (knee extension lag)  Ankle dorsiflexion 5 4+  Ankle plantarflexion    Ankle inversion    Ankle eversion    (Blank rows = not tested)  BED MOBILITY:  Findings: Sit to supine Modified independence Supine to sit Modified independence  TRANSFERS: Sit to stand: Modified independence  Assistive device utilized: Environmental consultant - 2 wheeled     Stand to sit: Modified independence  Assistive device utilized: Environmental consultant - 2 wheeled       STAIRS: Not tested GAIT: Findings: Gait Characteristics: decreased step length- Right, decreased stance time- Right, knee flexed in stance- Right, and knee flexed in stance- Left, Distance walked: 20 ft, Assistive device utilized:Walker - 2 wheeled, Level of assistance: SBA and CGA, and Comments: usually uses a rollator at home  FUNCTIONAL TESTS:  5 times sit to stand: 19.41 sec using hands on thighs Timed up and go (TUG): 22.29 using RW SLS  PATIENT SURVEYS:  ABC scale: The Activities-Specific Balance Confidence (ABC) Scale 0% 10 20 30  40 50 60 70 80 90 100% No confidence<->completely confident  "How confident are you that you will not lose your balance or become unsteady when you . . .   Date tested 06/09/24                                                  Total: #/16 850/1600 53.1%                                                                                                                                  TREATMENT DATE:  06/25/2024  Therapeutic Exercise: -Supine bridges on green exercise ball 2 sets of 6 reps,  pt cued for max hip extension -Standing 3 way hip 1 sets 10 reps, bilaterally, pt cued for upright trunk and maintaining of neutral spine -Leg press, 2 sets of 8 reps, plate 4, pt cued for knee alignment and eccentric control -Hamstring curls, 2 sets of 8 reps, plate 4, pt cued for max knee extension and eccentric control  Gait training:  -Ambulation trial with quad cane, 190 feet in 2 minutes and 47 seconds, pt cued for upright posture and sequencing with cane, slight SOB noted at end of lap. Gait belt and  CGA required for safety.   Therapeutic Activity: -Walking marches and butt kicks, 2 laps on 20 foot line, 4 lb ankle weights, pt cued for max ROM of LEs each step -Lateral stepping with 4lb ankle weights while holding tidal tank column at chest, 3 laps at elevated mat table, STS at each end of table   06/09/24 physical therapy evaluation and HEP instruction    PATIENT EDUCATION: Education details: Patient educated on exam findings, POC, scope of PT, HEP, and what to expect next visit. Person educated: Patient Education method: Explanation, Demonstration, and Handouts Education comprehension: verbalized understanding, returned demonstration, verbal cues required, and tactile cues required   HOME EXERCISE PROGRAM: Access Code: [email or web address] URL: [email or web address] Date: 06/09/2024 Prepared by: [facility]  Exercises - Sit to Stand  - 2 x daily - 7 x weekly - 1 sets - 10 reps - Supine Quad Set  - 2 x daily - 7 x weekly - 1 sets - 10 reps - 5 sec hold - Seated Knee Extension Stretch with Chair  - 2 x daily - 7 x weekly - 1 sets - 1 reps - 5 min hold - quad set with heel elevated  - 2 x daily - 7 x weekly - 1 sets - 1 reps - 5 min hold  GOALS: Goals reviewed with patient? No  SHORT TERM GOALS: Target date: 06/30/2024  patient will be  independent with initial HEP  Baseline: Goal status: INITIAL  2.  Patient will report 30% improvement overall  Baseline:  Goal status: INITIAL   LONG TERM GOALS: Target date: 07/21/2024  Patient will be independent in self management strategies to improve quality of life and functional outcomes.  Baseline:  Goal status: INITIAL  2.  Patient will report 50% improvement overall  Baseline:  Goal status: INITIAL  3.  Patient will improve ABC score by 15% to demonstrate improved perceived balance with functional activities  Baseline: 53.1% Goal status: INITIAL  4.  Patient will decrease TUG score to 18 sec or less to demonstrate improved functional mobility and decreased fall risk Baseline: 22.29 sec with RW Goal status: INITIAL  5.  Patient will increase left knee mobility extension to -10 or better to promote normal gait pattern  Baseline: -25 Goal status: INITIAL  ASSESSMENT:  CLINICAL IMPRESSION: Patient continues to demonstrate decreased LE strength/ROM, decreased gait quality and balance. Patient also demonstrates decreased endurance with aerobic based exercise during today's session with walking bout with quad cane, SOB noted. Patient able to progress dynamic balance and core activation exercises today with resisted walking and bridge variations, good performance with verbal cueing. Patient would continue to benefit from skilled physical therapy for increased endurance with ambulation, increased LE strength/ROM, and improved balance for improved quality of life, improved independence with gait training and continued progress towards therapy goals.   Eval: Patient is a 68 y.o. female who was seen today for physical therapy evaluation and treatment for Z86.73 (ICD-10-CM) - Personal history of transient ischemic attack (TIA), and cerebral infarction without residual deficits.  Patient demonstrates decreased strength, decreased mobility, balance deficits and gait  abnormalities which are negatively impacting patient ability to perform ADLs and functional mobility tasks. Patient will benefit from skilled physical therapy services to address these deficits to improve level of function with ADLs, functional mobility tasks, and reduce risk for falls.    OBJECTIVE IMPAIRMENTS: Abnormal gait, decreased activity tolerance, decreased balance, difficulty walking, decreased ROM, and decreased strength.   ACTIVITY LIMITATIONS: carrying, lifting,  standing, sleeping, and stairs  PARTICIPATION LIMITATIONS: meal prep, cleaning, driving, shopping, and community activity  PERSONAL FACTORS: Transportation and 1-2 comorbidities: RA and HTN are also affecting patient's functional outcome.   REHAB POTENTIAL: Good  CLINICAL DECISION MAKING: Evolving/moderate complexity  EVALUATION COMPLEXITY: Moderate  PLAN:  PT FREQUENCY: 1x/week  PT DURATION: 6 weeks  PLANNED INTERVENTIONS: 97164- PT Re-evaluation, 97110-Therapeutic exercises, 97530- Therapeutic activity, 97112- Neuromuscular re-education, 97535- Self Care, [id number]- Manual therapy, [id number]- Gait training, [phone]- Orthotic Fit/training, [phone]- Canalith repositioning, [id number]- Aquatic Therapy, [phone]- Splinting, [phone]- Wound care (first 20 sq cm), 97598- Wound care (each additional 20 sq cm)Patient/Family education, Balance training, Stair training, Taping, Dry Needling, Joint mobilization, Joint manipulation, Spinal manipulation, Spinal mobilization, Scar mobilization, and DME instructions.   PLAN FOR NEXT SESSION: Review HEP and goals; will attend 1 time a week due to transportation limitation; check SLS; work on lower extremity mobility, strengthening and functional gait and balance; steps  Lang Ada, PT, DPT [facility] Office: [phone] 3:45 PM, 06/25/24

## 2024-07-05 DIAGNOSIS — M069 Rheumatoid arthritis, unspecified: Secondary | ICD-10-CM | POA: Diagnosis not present

## 2024-07-05 DIAGNOSIS — R030 Elevated blood-pressure reading, without diagnosis of hypertension: Secondary | ICD-10-CM | POA: Diagnosis not present

## 2024-07-05 DIAGNOSIS — Z8673 Personal history of transient ischemic attack (TIA), and cerebral infarction without residual deficits: Secondary | ICD-10-CM | POA: Diagnosis not present

## 2024-07-05 DIAGNOSIS — L400 Psoriasis vulgaris: Secondary | ICD-10-CM | POA: Diagnosis not present

## 2024-07-05 DIAGNOSIS — E039 Hypothyroidism, unspecified: Secondary | ICD-10-CM | POA: Diagnosis not present

## 2024-07-05 DIAGNOSIS — Z0001 Encounter for general adult medical examination with abnormal findings: Secondary | ICD-10-CM | POA: Diagnosis not present

## 2024-07-05 DIAGNOSIS — Z0000 Encounter for general adult medical examination without abnormal findings: Secondary | ICD-10-CM | POA: Diagnosis not present

## 2024-07-05 DIAGNOSIS — Z23 Encounter for immunization: Secondary | ICD-10-CM | POA: Diagnosis not present

## 2024-07-06 ENCOUNTER — Ambulatory Visit (HOSPITAL_COMMUNITY)

## 2024-07-13 ENCOUNTER — Ambulatory Visit (HOSPITAL_COMMUNITY)

## 2024-07-13 DIAGNOSIS — R2689 Other abnormalities of gait and mobility: Secondary | ICD-10-CM

## 2024-07-13 DIAGNOSIS — R262 Difficulty in walking, not elsewhere classified: Secondary | ICD-10-CM

## 2024-07-13 DIAGNOSIS — M6281 Muscle weakness (generalized): Secondary | ICD-10-CM

## 2024-07-13 DIAGNOSIS — Z8673 Personal history of transient ischemic attack (TIA), and cerebral infarction without residual deficits: Secondary | ICD-10-CM

## 2024-07-13 DIAGNOSIS — Z7409 Other reduced mobility: Secondary | ICD-10-CM

## 2024-07-13 NOTE — Therapy (Signed)
 OUTPATIENT PHYSICAL THERAPY NEURO EVALUATION   Patient Name: Rebekah [name] MRN: [id number] DOB:07/13/1956, 68 y.o., female Today's Date: 07/13/2024   PCP: Joeann Browning, FNP REFERRING PROVIDER: Joeann Browning, FNP  END OF SESSION:  PT End of Session - 07/13/24 1504     Visit Number 3    Number of Visits 6    Date for Recertification  07/21/24    Authorization Type [employer] Dual complete    Authorization Time Period No auth no limit    PT Start Time 1503    PT Stop Time 1543    PT Time Calculation (min) 40 min    Equipment Utilized During Treatment Gait belt    Activity Tolerance Patient tolerated treatment well    Behavior During Therapy WFL for tasks assessed/performed           Past Medical History:  Diagnosis Date   Arthritis    Rheumatoid   Dysphagia    post CVA   Hyperlipidemia    Hypertension    Hyperthyroidism    Lupus (systemic lupus erythematosus) (HCC)    Seizure (HCC)    Stroke [facility])    Past Surgical History:  Procedure Laterality Date   BIOPSY  02/21/2022   Procedure: BIOPSY;  Surgeon: Cindie Carlin POUR, DO;  [facility];  Service: Endoscopy;;   CESAREAN SECTION     COLONOSCOPY WITH PROPOFOL  N/A 02/21/2022   Procedure: COLONOSCOPY WITH PROPOFOL ;  Surgeon: Cindie Carlin POUR, DO;  Location: [facility];  Service: Endoscopy;  Laterality: N/A;  2:00pm   ESOPHAGEAL BRUSHING  02/21/2022   Procedure: ESOPHAGEAL BRUSHING;  Surgeon: Cindie Carlin POUR, DO;  [facility];  Service: Endoscopy;;   ESOPHAGOGASTRODUODENOSCOPY (EGD) WITH PROPOFOL  N/A 02/21/2022   Procedure: ESOPHAGOGASTRODUODENOSCOPY (EGD) WITH PROPOFOL ;  Surgeon: Cindie Carlin POUR, DO;  [facility];  Service: Endoscopy;  Laterality: N/A;   POLYPECTOMY  02/21/2022   Procedure: POLYPECTOMY;  Surgeon: Cindie Carlin POUR, DO;  [facility];  Service: Endoscopy;;   Patient Active Problem List   Diagnosis Date Noted   Syncope and collapse 10/04/2023    Vitamin B 12 deficiency 05/08/2023   Failure to thrive in adult 04/26/2022   Oropharyngeal dysphagia 03/29/2022   Systemic lupus erythematosus (HCC) 03/29/2022   Diverticulosis 03/05/2022   Colon polyp 03/05/2022   Palpable abdominal aorta 03/05/2022   Protein-calorie malnutrition, severe 02/20/2022   Hypokalemia 02/19/2022   Weight loss 01/15/2022   Neurocognitive deficits 01/09/2022   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) 01/09/2022   Hypothyroidism 01/09/2022   History of discoid lupus erythematosus 01/09/2022   GERD without esophagitis 01/09/2022   Chronic constipation 01/09/2022   Major depression, recurrent, chronic 01/09/2022   Chronic anemia 01/09/2022   Thrombocytosis 01/09/2022   Hyperlipidemia LDL goal <70 01/09/2022   Iron deficiency anemia 09/29/2021   Insomnia due to medical condition 07/16/2021   Abnormality of gait as late effect of cerebrovascular accident (CVA) 07/16/2021   History of stroke 05/15/2021   Benign essential HTN 10/03/2018   Chronic kidney disease (CKD), stage IV (severe) (HCC) 10/03/2018   Rheumatoid arthritis involving multiple sites with positive rheumatoid factor (HCC) 01/16/2016    ONSET DATE: 2023 (2 strokes)  REFERRING DIAG: Z86.73 (ICD-10-CM) - Personal history of transient ischemic attack (TIA), and cerebral infarction without residual deficits  THERAPY DIAG:  Difficulty in walking, not elsewhere classified  Muscle weakness (generalized)  Other abnormalities of gait and mobility  Personal history of transient  cerebral ischemia  Impaired functional mobility, balance, gait, and endurance  Rationale for Evaluation and Treatment: Rehabilitation  SUBJECTIVE:                                                                                                                                                                                             SUBJECTIVE STATEMENT: Had a bad cramp in her left leg last night so  a little sore from that but overall doing ok.    Eval: Had 2 strokes back in 2023 and staying in bed made her weak and hamstrings tight.  Was coming here earlier this year until had to go into the hospital with arthritis and lupus (but lupus ruled out per patient)  and had to stop coming.  She state she is moving better overall; arrives in [facility] and self propels back to the gym.  She goes by [facility]; reports she uses a rollator at home but easier to use [facility] when transportation comes and picks her up.   Pt accompanied by: self  PERTINENT HISTORY:  RA, HTN, stroke 2022, seizure  PAIN:  Are you having pain? Yes: NPRS scale: 7/10 Pain location: knees Pain description: tight and achey Aggravating factors: at night Relieving factors: heat or try to stretch out  PRECAUTIONS: Fall   WEIGHT BEARING RESTRICTIONS: No  FALLS: Has patient fallen in last 6 months? No  LIVING ENVIRONMENT: Lives with: lives with their son Lives in: House/apartment Stairs: Yes: Internal: 12 steps; on right going up, on left going up, and can reach both and External: 3 steps; on right going up, on left going up, and can reach both Has following equipment at home: Single point cane, Walker - 4 wheeled, Wheelchair (manual), and bed side commode  PLOF: Independent with household mobility with device  PATIENT GOALS: be able to walk better  OBJECTIVE:  Note: Objective measures were completed at Evaluation unless otherwise noted.  DIAGNOSTIC FINDINGS:   COGNITION: Overall cognitive status: Within functional limits for tasks assessed   SENSATION: Denies N/T  COORDINATION:  EDEMA:  Some in left leg occassionally  MUSCLE TONE: appears wnl  MUSCLE LENGTH: Hamstrings: L 137 degrees, R 139 degrees   POSTURE: rounded shoulders, forward head, flexed trunk , and flexed knees  LOWER EXTREMITY ROM:     Active  Right Eval Left Eval  Hip flexion    Hip extension    Hip abduction    Hip adduction    Hip  internal rotation    Hip external rotation    Knee flexion 128 125  Knee extension -15 -25  Ankle dorsiflexion  Ankle plantarflexion    Ankle inversion    Ankle eversion     (Blank rows = not tested)  LOWER EXTREMITY MMT:    MMT Right Eval Left Eval  Hip flexion 5 4  Hip extension    Hip abduction    Hip adduction    Hip internal rotation    Hip external rotation    Knee flexion    Knee extension 4+ 4 (knee extension lag)  Ankle dorsiflexion 5 4+  Ankle plantarflexion    Ankle inversion    Ankle eversion    (Blank rows = not tested)  BED MOBILITY:  Findings: Sit to supine Modified independence Supine to sit Modified independence  TRANSFERS: Sit to stand: Modified independence  Assistive device utilized: Environmental Consultant - 2 wheeled     Stand to sit: Modified independence  Assistive device utilized: Environmental Consultant - 2 wheeled       STAIRS: Not tested GAIT: Findings: Gait Characteristics: decreased step length- Right, decreased stance time- Right, knee flexed in stance- Right, and knee flexed in stance- Left, Distance walked: 20 ft, Assistive device utilized:Walker - 2 wheeled, Level of assistance: SBA and CGA, and Comments: usually uses a rollator at home  FUNCTIONAL TESTS:  5 times sit to stand: 19.41 sec using hands on thighs Timed up and go (TUG): 22.29 using RW SLS  PATIENT SURVEYS:  ABC scale: The Activities-Specific Balance Confidence (ABC) Scale 0% 10 20 30  40 50 60 70 80 90 100% No confidence<->completely confident  "How confident are you that you will not lose your balance or become unsteady when you . . .   Date tested 06/09/24                                                  Total: #/16 850/1600 53.1%                                                                                                                                 TREATMENT DATE:  07/13/24 Nustep seat 11 level 3 x 5' dynamic warm up 1/2 lap PT gym with RW and SBA cues for form;  standing upright; pacing Standing Heel raises on incline 2 x 10 Slant board 3 x 20 TKE green theraband x 20 each leg Hamstring stretch 10 x 10 each leg Sit to stand holding tidal tank x 10 Standing holding tidal tank march x 10 Standing stagger stance mini squat to overhead x 5 each way Kettlebell goblet squats 10# x 10   06/25/2024  Therapeutic Exercise: -Supine bridges on green exercise ball 2 sets of 6 reps,  pt cued for max hip extension -Standing 3 way hip 1 sets 10 reps, bilaterally, pt cued for upright trunk and maintaining of neutral spine -Leg press, 2 sets of 8  reps, plate 4, pt cued for knee alignment and eccentric control -Hamstring curls, 2 sets of 8 reps, plate 4, pt cued for max knee extension and eccentric control  Gait training:  -Ambulation trial with quad cane, 190 feet in 2 minutes and 47 seconds, pt cued for upright posture and sequencing with cane, slight SOB noted at end of lap. Gait belt and CGA required for safety.   Therapeutic Activity: -Walking marches and butt kicks, 2 laps on 20 foot line, 4 lb ankle weights, pt cued for max ROM of LEs each step -Lateral stepping with 4lb ankle weights while holding tidal tank column at chest, 3 laps at elevated mat table, STS at each end of table   06/09/24 physical therapy evaluation and HEP instruction    PATIENT EDUCATION: Education details: Patient educated on exam findings, POC, scope of PT, HEP, and what to expect next visit. Person educated: Patient Education method: Explanation, Demonstration, and Handouts Education comprehension: verbalized understanding, returned demonstration, verbal cues required, and tactile cues required   HOME EXERCISE PROGRAM: Access Code: [email or web address] URL: [email or web address] Date: 06/09/2024 Prepared by: [facility]  Exercises - Sit to Stand  - 2 x daily - 7 x weekly - 1 sets - 10 reps - Supine Quad Set  - 2 x daily - 7 x weekly - 1 sets - 10 reps - 5 sec hold -  Seated Knee Extension Stretch with Chair  - 2 x daily - 7 x weekly - 1 sets - 1 reps - 5 min hold - quad set with heel elevated  - 2 x daily - 7 x weekly - 1 sets - 1 reps - 5 min hold  GOALS: Goals reviewed with patient? No  SHORT TERM GOALS: Target date: 06/30/2024  patient will be independent with initial HEP  Baseline: Goal status: INITIAL  2.  Patient will report 30% improvement overall  Baseline:  Goal status: INITIAL   LONG TERM GOALS: Target date: 07/21/2024  Patient will be independent in self management strategies to improve quality of life and functional outcomes.  Baseline:  Goal status: INITIAL  2.  Patient will report 50% improvement overall  Baseline:  Goal status: INITIAL  3.  Patient will improve ABC score by 15% to demonstrate improved perceived balance with functional activities  Baseline: 53.1% Goal status: INITIAL  4.  Patient will decrease TUG score to 18 sec or less to demonstrate improved functional mobility and decreased fall risk Baseline: 22.29 sec with RW Goal status: INITIAL  5.  Patient will increase left knee mobility extension to -10 or better to promote normal gait pattern  Baseline: -25 Goal status: INITIAL  ASSESSMENT:  CLINICAL IMPRESSION: Patient continues to demonstrate decreased LE strength/ROM, decreased gait quality and balance. Patient also demonstrates decreased endurance with aerobic based exercise during today's session with walking bout with RW and standing exercies. mild SOB noted. Patient continues with bilateral knee extension lag but is working on her knee extension at home daily.  Continued with tidal tank activity to challenge balance as well as strength.  Patient would continue to benefit from skilled physical therapy for increased endurance with ambulation, increased LE strength/ROM, and improved balance for improved quality of life, improved independence with gait training and continued progress towards therapy  goals.   Eval: Patient is a 68 y.o. female who was seen today for physical therapy evaluation and treatment for Z86.73 (ICD-10-CM) - Personal history of transient ischemic attack (TIA), and cerebral infarction without residual deficits.  Patient demonstrates decreased strength, decreased mobility, balance deficits and gait abnormalities which are negatively impacting patient ability to perform ADLs and functional mobility tasks. Patient will benefit from skilled physical therapy services to address these deficits to improve level of function with ADLs, functional mobility tasks, and reduce risk for falls.    OBJECTIVE IMPAIRMENTS: Abnormal gait, decreased activity tolerance, decreased balance, difficulty walking, decreased ROM, and decreased strength.   ACTIVITY LIMITATIONS: carrying, lifting, standing, sleeping, and stairs  PARTICIPATION LIMITATIONS: meal prep, cleaning, driving, shopping, and community activity  PERSONAL FACTORS: Transportation and 1-2 comorbidities: RA and HTN are also affecting patient's functional outcome.   REHAB POTENTIAL: Good  CLINICAL DECISION MAKING: Evolving/moderate complexity  EVALUATION COMPLEXITY: Moderate  PLAN:  PT FREQUENCY: 1x/week  PT DURATION: 6 weeks  PLANNED INTERVENTIONS: 97164- PT Re-evaluation, 97110-Therapeutic exercises, 97530- Therapeutic activity, 97112- Neuromuscular re-education, 97535- Self Care, [id number]- Manual therapy, [id number]- Gait training, [phone]- Orthotic Fit/training, [phone]- Canalith repositioning, [id number]- Aquatic Therapy, [phone]- Splinting, [phone]- Wound care (first 20 sq cm), 97598- Wound care (each additional 20 sq cm)Patient/Family education, Balance training, Stair training, Taping, Dry Needling, Joint mobilization, Joint manipulation, Spinal manipulation, Spinal mobilization, Scar mobilization, and DME instructions.   PLAN FOR NEXT SESSION: Review HEP and goals; will attend 1 time a week due to transportation limitation; check SLS;  work on lower extremity mobility, strengthening and functional gait and balance; steps  3:47 PM, 07/13/24 Estelita Iten Small Jahlen Bollman MPT [facility] [place] [phone] Ph:[phone]

## 2024-07-20 ENCOUNTER — Ambulatory Visit (HOSPITAL_COMMUNITY)

## 2024-07-27 ENCOUNTER — Ambulatory Visit (HOSPITAL_COMMUNITY): Attending: Nurse Practitioner

## 2024-07-27 DIAGNOSIS — Z7409 Other reduced mobility: Secondary | ICD-10-CM | POA: Insufficient documentation

## 2024-07-27 DIAGNOSIS — R262 Difficulty in walking, not elsewhere classified: Secondary | ICD-10-CM | POA: Diagnosis present

## 2024-07-27 DIAGNOSIS — Z8673 Personal history of transient ischemic attack (TIA), and cerebral infarction without residual deficits: Secondary | ICD-10-CM | POA: Diagnosis present

## 2024-07-27 DIAGNOSIS — M6281 Muscle weakness (generalized): Secondary | ICD-10-CM | POA: Diagnosis present

## 2024-07-27 DIAGNOSIS — R2689 Other abnormalities of gait and mobility: Secondary | ICD-10-CM | POA: Diagnosis present

## 2024-07-27 NOTE — Therapy (Signed)
 OUTPATIENT PHYSICAL THERAPY NEURO TREATMENT/PROGRESS NOTE Progress Note Reporting Period 06/09/2024 to 07/27/2024  See note below for Objective Data and Assessment of Progress/Goals.       Patient Name: Rebekah [name] MRN: [id number] DOB:1955-10-11, 68 y.o., female Today's Date: 07/27/2024   PCP: Joeann Browning, FNP REFERRING PROVIDER: Joeann Browning, FNP  END OF SESSION:  PT End of Session - 07/27/24 1500     Visit Number 4    Number of Visits 12    Date for Recertification  09/07/24    Authorization Type [employer] Dual complete    Authorization Time Period No auth no limit    PT Start Time 1500    PT Stop Time 1538    PT Time Calculation (min) 38 min    Equipment Utilized During Treatment Gait belt    Activity Tolerance Patient tolerated treatment well    Behavior During Therapy WFL for tasks assessed/performed           Past Medical History:  Diagnosis Date   Arthritis    Rheumatoid   Dysphagia    post CVA   Hyperlipidemia    Hypertension    Hyperthyroidism    Lupus (systemic lupus erythematosus) (HCC)    Seizure (HCC)    Stroke [facility])    Past Surgical History:  Procedure Laterality Date   BIOPSY  02/21/2022   Procedure: BIOPSY;  Surgeon: Cindie Carlin POUR, DO;  [facility]: [facility];  Service: Endoscopy;;   CESAREAN SECTION     COLONOSCOPY WITH PROPOFOL  N/A 02/21/2022   Procedure: COLONOSCOPY WITH PROPOFOL ;  Surgeon: Cindie Carlin POUR, DO;  Location: [facility];  Service: Endoscopy;  Laterality: N/A;  2:00pm   ESOPHAGEAL BRUSHING  02/21/2022   Procedure: ESOPHAGEAL BRUSHING;  Surgeon: Cindie Carlin POUR, DO;  [facility];  Service: Endoscopy;;   ESOPHAGOGASTRODUODENOSCOPY (EGD) WITH PROPOFOL  N/A 02/21/2022   Procedure: ESOPHAGOGASTRODUODENOSCOPY (EGD) WITH PROPOFOL ;  Surgeon: Cindie Carlin POUR, DO;  [facility];  Service: Endoscopy;  Laterality: N/A;   POLYPECTOMY  02/21/2022   Procedure: POLYPECTOMY;  Surgeon: Cindie Carlin POUR,  DO;  Location: [facility];  Service: Endoscopy;;   Patient Active Problem List   Diagnosis Date Noted   Syncope and collapse 10/04/2023   Vitamin B 12 deficiency 05/08/2023   Failure to thrive in adult 04/26/2022   Oropharyngeal dysphagia 03/29/2022   Systemic lupus erythematosus (HCC) 03/29/2022   Diverticulosis 03/05/2022   Colon polyp 03/05/2022   Palpable abdominal aorta 03/05/2022   Protein-calorie malnutrition, severe 02/20/2022   Hypokalemia 02/19/2022   Weight loss 01/15/2022   Neurocognitive deficits 01/09/2022   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) 01/09/2022   Hypothyroidism 01/09/2022   History of discoid lupus erythematosus 01/09/2022   GERD without esophagitis 01/09/2022   Chronic constipation 01/09/2022   Major depression, recurrent, chronic 01/09/2022   Chronic anemia 01/09/2022   Thrombocytosis 01/09/2022   Hyperlipidemia LDL goal <70 01/09/2022   Iron deficiency anemia 09/29/2021   Insomnia due to medical condition 07/16/2021   Abnormality of gait as late effect of cerebrovascular accident (CVA) 07/16/2021   History of stroke 05/15/2021   Benign essential HTN 10/03/2018   Chronic kidney disease (CKD), stage IV (severe) (HCC) 10/03/2018   Rheumatoid arthritis involving multiple sites with positive rheumatoid factor (HCC) 01/16/2016    ONSET DATE: 2023 (2 strokes)  REFERRING DIAG: Z86.73 (ICD-10-CM) - Personal history of transient ischemic attack (TIA), and cerebral infarction without residual deficits  THERAPY DIAG:  Muscle weakness (generalized) - Plan: PT plan of care cert/re-cert  Difficulty in walking, not elsewhere classified - Plan: PT plan of care cert/re-cert  Other abnormalities of gait and mobility - Plan: PT plan of care cert/re-cert  Personal history of transient cerebral ischemia - Plan: PT plan of care cert/re-cert  Impaired functional mobility, balance, gait, and endurance - Plan: PT plan of care  cert/re-cert  Rationale for Evaluation and Treatment: Rehabilitation  SUBJECTIVE:                                                                                                                                                                                             SUBJECTIVE STATEMENT: About 50% better overall; still has some issue with balance with walking; left leg still wants to give out some.    Eval: Had 2 strokes back in 2023 and staying in bed made her weak and hamstrings tight.  Was coming here earlier this year until had to go into the hospital with arthritis and lupus (but lupus ruled out per patient)  and had to stop coming.  She state she is moving better overall; arrives in [facility] and self propels back to the gym.  She goes by [facility]; reports she uses a rollator at home but easier to use [facility] when transportation comes and picks her up.   Pt accompanied by: self  PERTINENT HISTORY:  RA, HTN, stroke 2022, seizure  PAIN:  Are you having pain? Yes: NPRS scale: 7/10 Pain location: knees Pain description: tight and achey Aggravating factors: at night Relieving factors: heat or try to stretch out  PRECAUTIONS: Fall   WEIGHT BEARING RESTRICTIONS: No  FALLS: Has patient fallen in last 6 months? No  LIVING ENVIRONMENT: Lives with: lives with their son Lives in: House/apartment Stairs: Yes: Internal: 12 steps; on right going up, on left going up, and can reach both and External: 3 steps; on right going up, on left going up, and can reach both Has following equipment at home: Single point cane, Walker - 4 wheeled, Wheelchair (manual), and bed side commode  PLOF: Independent with household mobility with device  PATIENT GOALS: be able to walk better  OBJECTIVE:  Note: Objective measures were completed at Evaluation unless otherwise noted.  DIAGNOSTIC FINDINGS:   COGNITION: Overall cognitive status: Within functional limits for tasks assessed   SENSATION: Denies  N/T  COORDINATION:  EDEMA:  Some in left leg occassionally  MUSCLE TONE: appears wnl  MUSCLE LENGTH: Hamstrings: L 137 degrees, R 139 degrees   POSTURE: rounded shoulders, forward head, flexed trunk , and flexed knees  LOWER EXTREMITY ROM:  Active  Right Eval Left Eval Right 07/27/24 Left 07/27/24  Hip flexion      Hip extension      Hip abduction      Hip adduction      Hip internal rotation      Hip external rotation      Knee flexion 128 125 131 130  Knee extension -15 -25 -12 -24  Ankle dorsiflexion      Ankle plantarflexion      Ankle inversion      Ankle eversion       (Blank rows = not tested)  LOWER EXTREMITY MMT:    MMT Right Eval Left Eval Right 07/27/24 Left 07/27/24  Hip flexion 5 4 5  4+  Hip extension      Hip abduction      Hip adduction      Hip internal rotation      Hip external rotation      Knee flexion   4+ (sitting) 4 (sitting)  Knee extension 4+ 4 (knee extension lag) 5 4+ (in available range)  Ankle dorsiflexion 5 4+    Ankle plantarflexion      Ankle inversion      Ankle eversion      (Blank rows = not tested)  BED MOBILITY:  Findings: Sit to supine Modified independence Supine to sit Modified independence  TRANSFERS: Sit to stand: Modified independence  Assistive device utilized: Environmental Consultant - 2 wheeled     Stand to sit: Modified independence  Assistive device utilized: Environmental Consultant - 2 wheeled       STAIRS: Not tested GAIT: Findings: Gait Characteristics: decreased step length- Right, decreased stance time- Right, knee flexed in stance- Right, and knee flexed in stance- Left, Distance walked: 20 ft, Assistive device utilized:Walker - 2 wheeled, Level of assistance: SBA and CGA, and Comments: usually uses a rollator at home  FUNCTIONAL TESTS:  5 times sit to stand: 19.41 sec using hands on thighs Timed up and go (TUG): 22.29 using RW SLS  PATIENT SURVEYS:  ABC scale: The Activities-Specific Balance Confidence (ABC) Scale 0%  10 20 30  40 50 60 70 80 90 100% No confidence<->completely confident  "How confident are you that you will not lose your balance or become unsteady when you . . .   Date tested 06/09/24                                                  Total: #/16 850/1600 53.1%                                                                                                                                 TREATMENT DATE:  07/27/24 Progress note ABC scale 49.4% 5 times sit to stand 13.88 sec TUG 15.03 sec no AD;  stand by assist AROM and MMT's see above    07/13/24 Nustep seat 11 level 3 x 5' dynamic warm up 1/2 lap PT gym with RW and SBA cues for form; standing upright; pacing Standing Heel raises on incline 2 x 10 Slant board 3 x 20 TKE green theraband x 20 each leg Hamstring stretch 10 x 10 each leg Sit to stand holding tidal tank x 10 Standing holding tidal tank march x 10 Standing stagger stance mini squat to overhead x 5 each way Kettlebell goblet squats 10# x 10   06/25/2024  Therapeutic Exercise: -Supine bridges on green exercise ball 2 sets of 6 reps,  pt cued for max hip extension -Standing 3 way hip 1 sets 10 reps, bilaterally, pt cued for upright trunk and maintaining of neutral spine -Leg press, 2 sets of 8 reps, plate 4, pt cued for knee alignment and eccentric control -Hamstring curls, 2 sets of 8 reps, plate 4, pt cued for max knee extension and eccentric control  Gait training:  -Ambulation trial with quad cane, 190 feet in 2 minutes and 47 seconds, pt cued for upright posture and sequencing with cane, slight SOB noted at end of lap. Gait belt and CGA required for safety.   Therapeutic Activity: -Walking marches and butt kicks, 2 laps on 20 foot line, 4 lb ankle weights, pt cued for max ROM of LEs each step -Lateral stepping with 4lb ankle weights while holding tidal tank column at chest, 3 laps at elevated mat table, STS at each end of table   06/09/24  physical therapy evaluation and HEP instruction    PATIENT EDUCATION: Education details: Patient educated on exam findings, POC, scope of PT, HEP, and what to expect next visit. Person educated: Patient Education method: Explanation, Demonstration, and Handouts Education comprehension: verbalized understanding, returned demonstration, verbal cues required, and tactile cues required   HOME EXERCISE PROGRAM: 07/27/24 Prone knee extension stretch Access Code: [email or web address] URL: [email or web address] Date: 06/09/2024 Prepared by: [facility]  Exercises - Sit to Stand  - 2 x daily - 7 x weekly - 1 sets - 10 reps - Supine Quad Set  - 2 x daily - 7 x weekly - 1 sets - 10 reps - 5 sec hold - Seated Knee Extension Stretch with Chair  - 2 x daily - 7 x weekly - 1 sets - 1 reps - 5 min hold - quad set with heel elevated  - 2 x daily - 7 x weekly - 1 sets - 1 reps - 5 min hold  GOALS: Goals reviewed with patient? No  SHORT TERM GOALS: Target date: 06/30/2024  patient will be independent with initial HEP  Baseline: Goal status: in progress  2.  Patient will report 30% improvement overall  Baseline:  Goal status: in progress   LONG TERM GOALS: Target date: 09/07/2024  Patient will be independent in self management strategies to improve quality of life and functional outcomes.  Baseline:  Goal status:in progress  2.  Patient will report 50% improvement overall  Baseline:  Goal status: met  3.  Patient will improve ABC score by 15% to demonstrate improved perceived balance with functional activities  Baseline: 53.1% Goal status: INITIAL  4.  Patient will decrease TUG score to 18 sec or less to demonstrate improved functional mobility and decreased fall risk Baseline: 22.29 sec with RW; TUG 15 sec Goal status: met  5.  Patient will increase left knee mobility extension to -10 or better to  promote normal gait pattern  Baseline: -25 Goal status:  INITIAL  ASSESSMENT:  CLINICAL IMPRESSION: Progress note today.  Overall good functional improvement with objective testing however is still limited with knee extension bilaterally left > right and is limited in her ability to walk safely due to balance impairment and decreased confidence score. Patient met short term goals and 2/5 long term goals; she will benefit from continued skilled therapy services to address remaining unmet and partially met goals.   Patient would continue to benefit from skilled physical therapy for increased endurance with ambulation, increased LE strength/ROM, and improved balance for improved quality of life, improved independence with gait training and continued progress towards therapy goals.   Eval: Patient is a 68 y.o. female who was seen today for physical therapy evaluation and treatment for Z86.73 (ICD-10-CM) - Personal history of transient ischemic attack (TIA), and cerebral infarction without residual deficits.  Patient demonstrates decreased strength, decreased mobility, balance deficits and gait abnormalities which are negatively impacting patient ability to perform ADLs and functional mobility tasks. Patient will benefit from skilled physical therapy services to address these deficits to improve level of function with ADLs, functional mobility tasks, and reduce risk for falls.    OBJECTIVE IMPAIRMENTS: Abnormal gait, decreased activity tolerance, decreased balance, difficulty walking, decreased ROM, and decreased strength.   ACTIVITY LIMITATIONS: carrying, lifting, standing, sleeping, and stairs  PARTICIPATION LIMITATIONS: meal prep, cleaning, driving, shopping, and community activity  PERSONAL FACTORS: Transportation and 1-2 comorbidities: RA and HTN are also affecting patient's functional outcome.   REHAB POTENTIAL: Good  CLINICAL DECISION MAKING: Evolving/moderate complexity  EVALUATION COMPLEXITY: Moderate  PLAN:  PT FREQUENCY: 1x/week  PT  DURATION: 6 weeks  PLANNED INTERVENTIONS: 97164- PT Re-evaluation, 97110-Therapeutic exercises, 97530- Therapeutic activity, 97112- Neuromuscular re-education, 97535- Self Care, [id number]- Manual therapy, [id number]- Gait training, [phone]- Orthotic Fit/training, [phone]- Canalith repositioning, [id number]- Aquatic Therapy, [phone]- Splinting, [phone]- Wound care (first 20 sq cm), 97598- Wound care (each additional 20 sq cm)Patient/Family education, Balance training, Stair training, Taping, Dry Needling, Joint mobilization, Joint manipulation, Spinal manipulation, Spinal mobilization, Scar mobilization, and DME instructions.   PLAN FOR NEXT SESSION: Review HEP and goals; will attend 1 time a week due to transportation limitation;she will benefit from continued skilled therapy services to address remaining unmet and partially met goals.   Patient would continue to benefit from skilled physical therapy for increased endurance with ambulation, increased LE strength/ROM, and improved balance for improved quality of life, improved independence with gait training and continued progress towards therapy goals.   3:35 PM, 07/27/24 Nashika Coker Small Anagabriela Jokerst MPT  physical therapy  [phone]

## 2024-08-03 ENCOUNTER — Ambulatory Visit (HOSPITAL_COMMUNITY)

## 2024-08-18 ENCOUNTER — Ambulatory Visit (HOSPITAL_COMMUNITY): Admitting: Physical Therapy

## 2024-08-19 ENCOUNTER — Ambulatory Visit (HOSPITAL_COMMUNITY): Admitting: Physical Therapy

## 2024-08-24 ENCOUNTER — Ambulatory Visit (HOSPITAL_COMMUNITY)

## 2024-08-27 ENCOUNTER — Ambulatory Visit (HOSPITAL_COMMUNITY)

## 2024-08-30 ENCOUNTER — Ambulatory Visit (HOSPITAL_COMMUNITY)

## 2024-09-02 ENCOUNTER — Ambulatory Visit (HOSPITAL_COMMUNITY)

## 2024-09-07 ENCOUNTER — Ambulatory Visit (HOSPITAL_COMMUNITY)
